# Patient Record
Sex: Male | Born: 1940 | Race: White | Hispanic: No | Smoking: Former smoker
Health system: Southern US, Community
[De-identification: ages and names within clinical notes are randomized; demographics above are authoritative.]

## PROBLEM LIST (undated history)

## (undated) DIAGNOSIS — I480 Paroxysmal atrial fibrillation: Secondary | ICD-10-CM

## (undated) DIAGNOSIS — I10 Essential (primary) hypertension: Secondary | ICD-10-CM

## (undated) DIAGNOSIS — M1990 Unspecified osteoarthritis, unspecified site: Secondary | ICD-10-CM

## (undated) DIAGNOSIS — E785 Hyperlipidemia, unspecified: Secondary | ICD-10-CM

## (undated) DIAGNOSIS — I82409 Acute embolism and thrombosis of unspecified deep veins of unspecified lower extremity: Secondary | ICD-10-CM

## (undated) DIAGNOSIS — Z87442 Personal history of urinary calculi: Secondary | ICD-10-CM

## (undated) HISTORY — DX: Essential (primary) hypertension: I10

## (undated) HISTORY — DX: Acute embolism and thrombosis of unspecified deep veins of unspecified lower extremity: I82.409

## (undated) HISTORY — PX: EYE SURGERY: SHX253

## (undated) HISTORY — DX: Hyperlipidemia, unspecified: E78.5

## (undated) HISTORY — DX: Paroxysmal atrial fibrillation: I48.0

## (undated) HISTORY — PX: TONSILLECTOMY: SUR1361

---

## 1959-01-24 HISTORY — PX: SHOULDER SURGERY: SHX246

## 2014-09-23 ENCOUNTER — Telehealth: Payer: Self-pay | Admitting: Internal Medicine

## 2014-09-23 NOTE — Telephone Encounter (Signed)
Rec'd records from [facility] Forwarding 24 page's to Dr.Ross

## 2014-10-16 ENCOUNTER — Ambulatory Visit (INDEPENDENT_AMBULATORY_CARE_PROVIDER_SITE_OTHER): Payer: Medicare PPO | Admitting: Internal Medicine

## 2014-10-16 ENCOUNTER — Encounter: Payer: Self-pay | Admitting: Internal Medicine

## 2014-10-16 VITALS — BP 146/94 | HR 81 | Ht 71.0 in | Wt 212.1 lb

## 2014-10-16 DIAGNOSIS — I481 Persistent atrial fibrillation: Secondary | ICD-10-CM

## 2014-10-16 DIAGNOSIS — I4819 Other persistent atrial fibrillation: Secondary | ICD-10-CM

## 2014-10-16 NOTE — Progress Notes (Signed)
Cardiology Office Note   Date:  10/16/2014   ID:  Malick Netz, DOB Nov 27, 1940, MRN [id number]  PCP:  Jonnie Kind  Cardiologist:   Dietrich Pates, MD   No chief complaint on file.     History of Present Illness: Curtis Wagner is a 74 y.o. male with a history of Atrial fib  He was followed   Review of the records and discussing with the pt he was in afib and treated with sotalol.  He went back to the [facility]op this summer   Found to be in Afib with RVR  Sotalol was discontinued  He was not felt to be a candidate for Tikosyn   Did not want amio due to side effects and age.  He walks regularly at the gym   Level 3 on treadmill for 1 hour.   Biggest complaint is R shoulder pain  Waiting to have surgery Preop evla  He denies CP  Breathing is OK  Rare dizziness  No syncope     Current Outpatient Prescriptions  Medication Sig Dispense Refill  . allopurinol (ZYLOPRIM) 300 MG tablet Take 300 mg by mouth daily.    . digoxin (DIGOX) 0.125 MG tablet Take 0.125 mg by mouth daily.    Marland Kitchen diltiazem (CARDIZEM CD) 180 MG 24 hr capsule Take 180 mg by mouth every morning.    . diltiazem (DILTIAZEM CD) 120 MG 24 hr capsule Take 120 mg by mouth every evening.    Marland Kitchen lisinopril (PRINIVIL,ZESTRIL) 10 MG tablet Take 10 mg by mouth daily.    . Multiple Vitamins-Minerals (CENTRUM SILVER ADULT 50+) TABS Take 1 tablet by mouth daily.    . rivaroxaban (XARELTO) 20 MG TABS tablet Take 20 mg by mouth daily with supper.     No current facility-administered medications for this visit.    Allergies:   Morphine and related   Past Medical History  Diagnosis Date  . PAF (paroxysmal atrial fibrillation)   . DVT (deep venous thrombosis)   . Hypertension   . Hyperlipidemia     Past Surgical History  Procedure Laterality Date  . Shoulder surgery       Social History:  The patient  reports that he has quit smoking. He has quit using smokeless tobacco. His smokeless tobacco use included Chew.   Family  History:  The patient's family history includes Heart disease in his mother; Rheumatic fever in his mother; Stroke in his father and mother.    ROS:  Please see the history of present illness. All other systems are reviewed and  Negative to the above problem except as noted.    PHYSICAL EXAM: VS:  BP 146/94 mmHg  Pulse 81  Ht  (1.803 m)  Wt 212 lb 2 oz (96.218 kg)  BMI 29.60 kg/m2  SpO2 97%  GEN: Well nourished, well developed, in no acute distress HEENT: normal Neck: no JVD, carotid bruits, or masses Cardiac: Irreg; no murmurs, rubs, or gallops,no edema  Respiratory:  clear to auscultation bilaterally, normal work of breathing GI: soft, nontender, nondistended, + BS  No hepatomegaly  MS: no deformity Moving all extremities   Skin: warm and dry, no rash Neuro:  Strength and sensation are intact Psych: euthymic mood, full affect   EKG:  EKG is ordered today.  Atrial fib  81 bpm  Occasional PVC  Nonspecific ST T wave changes     Lipid Panel No results found for: CHOL, TRIG, HDL, CHOLHDL, VLDL, LDLCALC, LDLDIRECT  Wt Readings from Last 3 Encounters:  10/16/14 212 lb 2 oz (96.218 kg)      ASSESSMENT AND PLAN: 1  Atrial fib  Currently on rate control  Fairly active  I am not sure that he is limited by it.   Pt and daughter did come today to discuss afib ablation  That had been discussed with his other cardiologist.   I would not favor before shoulder surgery  This would delay things  Again, I am not sure how much he would gain if he returned to SR  2.  Preop cardiac eval  Pt is rel active .  No CP  No SOB   Recent echo LVEF was normal   Ovreall I think he is at low risk for major cardiac event and ok to proceed without further testing.  I will forward note to Dr Pamella Pert in [place]  Pt will call when surgery is over     Signed, Dietrich Pates, MD  10/16/2014 10:33 AM    [facility] [address] [place], [place], [place]  [address] Phone: [phone]; Fax: [phone]

## 2014-10-16 NOTE — Patient Instructions (Signed)
Medication Instructions: .  Your physician recommends that you continue on your current medications as directed. Please refer to the Current Medication list given to you today.  Labwork: NONE ORDER TODAY    Testing/Procedures: NONE ORDER TODAY    Follow-Up:  WILL CONTACT OFFICE BACK AFTER SHOULDER  SURGERY    Any Other Special Instructions Will Be Listed Below (If Applicable).

## 2014-11-19 ENCOUNTER — Encounter: Payer: Self-pay | Admitting: Internal Medicine

## 2014-12-21 ENCOUNTER — Other Ambulatory Visit: Payer: Self-pay | Admitting: Physician Assistant

## 2014-12-21 NOTE — H&P (Signed)
Curtis Wagner is a new patient to the office.  Comes in with his daughter.  This is a healthy 74 year-old male.  Progressive increasing symptoms, right shoulder.  Gotten steadily worse over the last two years.  It has gotten to a point that he has rest pain and night pain.  Marked functional impact.  His shoulder is getting stiff enough he is having trouble putting deodorant on.  He has had previous evaluation, including MRI scan, that shows marked degenerative changes throughout the shoulder.  Some thinning attenuation of his cuff, but this still appears to be intact.  All of this very consistent with marked end stage arthritis.  He has not had significant operative intervention at all on this side.  He comes in to discuss definitive treatment.   Remaining history is reviewed.  Significant for atrial fibrillation, which is stable, although he is on Xarelto.  He has already seen his cardiologist and has been told that he can be given clearance for surgery.  Of note, he has had operative intervention on the left shoulder back in the 1960's.  This sounds like a Magnuson-Stack for instability.  That cured his instability.  He has never gotten his full motion back, but he is not having any significant symptoms there now.    EXAMINATION: General exam is reviewed.  Specifically, 74 year-old.  Height: 5?11.  Weight: 195 pounds.  Right shoulder has maybe 30% active and passive motion.  Marked grating.  Marked crepitus.  Pain at end points.  On the left he can go overhead and abduct, but he has no external rotation.  Well healed incision.  Neurovascularly intact distally.    X-RAYS: Three view x-rays of the right shoulder with sizing balls shows end stage changes.  Periarticular spurring.  Still reasonable subacromial space.    DISPOSITION:  End stage arthritis, right shoulder.  The only option he has here is a total shoulder and he understands that.  That procedure, risks, benefits and complications reviewed.   Paperwork complete.  All questions answered.  I think we can do this on an outpatient basis.  We are going to need to get cardiac clearance.  That will be complete and I will see him at the time of operative intervention.  I have also talked to both he and his daughter about what to expect post-op and what plans they need to make.    Loreta Aveaniel F. Murphy, M.D.

## 2014-12-21 NOTE — Progress Notes (Signed)
Chart reviewed by Dr Desmond Lopeurk, due to patient hx a-fib he feels patient will need cardiac monitoring after surgery. Dr Murphy's office notified of need to be done at [facility].

## 2014-12-25 ENCOUNTER — Other Ambulatory Visit: Payer: Self-pay | Admitting: Physician Assistant

## 2014-12-30 NOTE — Pre-Procedure Instructions (Signed)
Curtis BarcelonaMichael Gorniak  12/30/2014      [facility] - [place], [place] - [id number] [place] [id number] Phone: [phone] Fax: [phone]    Your procedure is scheduled on Fri, Dec 9 @ 1:40 PM  [facility] Admitting at 11:30 AM  Call this number if you have problems the morning of surgery:  [phone]   Remember:  Do not eat food or drink liquids after midnight.  Take these medicines the morning of surgery with A SIP OF WATER Allopurinol(Zyloprim),Digoxin(Digox),and Diltiazem(Cardizem)            No Goody's,BC's,Aleve,Aspirin,Ibuprofen,Motrin,Advil,Fish Oil,or any Herbal Medications.   Do not wear jewelry.  Do not wear lotions, powders, or colognes.  You may wear deodorant.             Men may shave face and neck.  Do not bring valuables to the hospital.  [facility] is not responsible for any belongings or valuables.  Contacts, dentures or bridgework may not be worn into surgery.  Leave your suitcase in the car.  After surgery it may be brought to your room.  For patients admitted to the hospital, discharge time will be determined by your treatment team.  Patients discharged the day of surgery will not be allowed to drive home.      Special instructions: [facility] - Preparing for Surgery  Before surgery, you can play an important role.  Because skin is not sterile, your skin needs to be as free of germs as possible.  You can reduce the number of germs on you skin by washing with CHG (chlorahexidine gluconate) soap before surgery.  CHG is an antiseptic cleaner which kills germs and bonds with the skin to continue killing germs even after washing.  Please DO NOT use if you have an allergy to CHG or antibacterial soaps.  If your skin becomes reddened/irritated stop using the CHG and inform your nurse when you arrive at Short Stay.  Do not shave (including legs and underarms) for at least 48 hours prior to the first  CHG shower.  You may shave your face.  Please follow these instructions carefully:   1.  Shower with CHG Soap the night before surgery and the                                morning of Surgery.  2.  If you choose to wash your hair, wash your hair first as usual with your       normal shampoo.  3.  After you shampoo, rinse your hair and body thoroughly to remove the                      Shampoo.  4.  Use CHG as you would any other liquid soap.  You can apply chg directly       to the skin and wash gently with scrungie or a clean washcloth.  5.  Apply the CHG Soap to your body ONLY FROM THE NECK DOWN.        Do not use on open wounds or open sores.  Avoid contact with your eyes,       ears, mouth and genitals (private parts).  Wash genitals (private parts)       with your normal soap.  6.  Wash thoroughly, paying special attention to  the area where your surgery        will be performed.  7.  Thoroughly rinse your body with warm water from the neck down.  8.  DO NOT shower/wash with your normal soap after using and rinsing off       the CHG Soap.  9.  Pat yourself dry with a clean towel.            10.  Wear clean pajamas.            11.  Place clean sheets on your bed the night of your first shower and do not        sleep with pets.  Day of Surgery  Do not apply any lotions/deoderants the morning of surgery.  Please wear clean clothes to the hospital/surgery center.    Please read over the following fact sheets that you were given. Pain Booklet, Coughing and Deep Breathing, MRSA Information and Surgical Site Infection Prevention

## 2014-12-31 ENCOUNTER — Encounter (HOSPITAL_COMMUNITY)
Admission: RE | Admit: 2014-12-31 | Discharge: 2014-12-31 | Disposition: A | Discharge status: Home / Self Care | Payer: Medicare PPO | Source: Ambulatory Visit | Attending: Orthopedic Surgery | Admitting: Orthopedic Surgery

## 2014-12-31 ENCOUNTER — Encounter (HOSPITAL_COMMUNITY): Payer: Self-pay

## 2014-12-31 HISTORY — DX: Unspecified osteoarthritis, unspecified site: M19.90

## 2014-12-31 LAB — COMPREHENSIVE METABOLIC PANEL
ALT: 20 U/L (ref 17–63)
ANION GAP: 7 (ref 5–15)
AST: 23 U/L (ref 15–41)
Albumin: 3.8 g/dL (ref 3.5–5.0)
Alkaline Phosphatase: 100 U/L (ref 38–126)
BILIRUBIN TOTAL: 0.9 mg/dL (ref 0.3–1.2)
BUN: 14 mg/dL (ref 6–20)
CHLORIDE: 107 mmol/L (ref 101–111)
CO2: 28 mmol/L (ref 22–32)
Calcium: 9.5 mg/dL (ref 8.9–10.3)
Creatinine, Ser: 1.37 mg/dL — ABNORMAL HIGH (ref 0.61–1.24)
GFR, EST AFRICAN AMERICAN: 56 mL/min — AB (ref >60)
GFR, EST NON AFRICAN AMERICAN: 49 mL/min — AB (ref >60)
Glucose, Bld: 108 mg/dL — ABNORMAL HIGH (ref 65–99)
POTASSIUM: 3.9 mmol/L (ref 3.5–5.1)
Sodium: 142 mmol/L (ref 135–145)
TOTAL PROTEIN: 7.3 g/dL (ref 6.5–8.1)

## 2014-12-31 LAB — CBC WITH DIFFERENTIAL/PLATELET
BASOS ABS: 0.1 10*3/uL (ref 0.0–0.1)
BASOS PCT: 1 %
EOS PCT: 3 %
Eosinophils Absolute: 0.2 10*3/uL (ref 0.0–0.7)
HEMATOCRIT: 52 % (ref 39.0–52.0)
Hemoglobin: 17.8 g/dL — ABNORMAL HIGH (ref 13.0–17.0)
LYMPHS PCT: 25 %
Lymphs Abs: 1.8 10*3/uL (ref 0.7–4.0)
MCH: 33.1 pg (ref 26.0–34.0)
MCHC: 34.2 g/dL (ref 30.0–36.0)
MCV: 96.8 fL (ref 78.0–100.0)
MONO ABS: 0.5 10*3/uL (ref 0.1–1.0)
MONOS PCT: 6 %
NEUTROS ABS: 4.8 10*3/uL (ref 1.7–7.7)
Neutrophils Relative %: 65 %
PLATELETS: 216 10*3/uL (ref 150–400)
RBC: 5.37 MIL/uL (ref 4.22–5.81)
RDW: 13.6 % (ref 11.5–15.5)
WBC: 7.4 10*3/uL (ref 4.0–10.5)

## 2014-12-31 LAB — ABO/RH: ABO/RH(D): A POS

## 2014-12-31 LAB — TYPE AND SCREEN
ABO/RH(D): A POS
ANTIBODY SCREEN: NEGATIVE

## 2014-12-31 LAB — SURGICAL PCR SCREEN
MRSA, PCR: NEGATIVE
STAPHYLOCOCCUS AUREUS: NEGATIVE

## 2014-12-31 LAB — PROTIME-INR
INR: 1.08 (ref 0.00–1.49)
PROTHROMBIN TIME: 14.2 s (ref 11.6–15.2)

## 2014-12-31 LAB — APTT: APTT: 28 s (ref 24–37)

## 2014-12-31 MED ORDER — CHLORHEXIDINE GLUCONATE 4 % EX LIQD: 60.0000 mL | Freq: Once | CUTANEOUS | Status: DC

## 2014-12-31 MED ORDER — CEFAZOLIN SODIUM-DEXTROSE 2-3 GM-% IV SOLR
2.0000 g | INTRAVENOUS | Status: AC
  Administered 2015-01-01: 2 g via INTRAVENOUS
  Filled 2014-12-31: qty 50

## 2014-12-31 MED ORDER — LACTATED RINGERS IV SOLN
INTRAVENOUS | Status: DC
  Administered 2015-01-01 (×2): via INTRAVENOUS

## 2014-12-31 NOTE — Progress Notes (Signed)
Anesthesia Chart Review:  Pt is 74 year old male scheduled for R total shoulder arthroplasty on 01/01/2015 with Dr. Jamison Neighbor. Murphy.   PMH includes:  PAF, HTN, DVT, hyperlipidemia. Former smoker. BMI 29.5  Medications include: digoxin, diltiazem, lisinopril, xarelto. Last xarelto 12/25/14.   Preoperative labs reviewed.   EKG 10/17/14: atrial fibrillation with premature aberrantly conducted complexes. 81 bpm. Nonspecific ST and T wave abnormality, probably digitalis effect.   Echo 07/22/14:  1. Normal LV size, thickness and function, EF 68%, with grade 1 diastolic dysfunction.  2. Mild AR, MR and TR with PA pressure 40 mmHg.  Treadmill stress test 07/01/12:  1. Normal routine treadmill stress test 2. Moderately impaired aerobic exercise capacity for his age 373. No ischemic ST changes were noted 4. Pt did have appropriate HR and BP response to exercise.   [place], [place] and is followed by cardiology in nearby [place].  Has family here so planned for surgery here. Dr. Dietrich PatesPaula Ross with cardiology in [place] saw pt for pre-op eval 10/16/14 and reviewed records from pt's primary cardiologist (found in media tab).  Dr. Tenny Crawoss has cleared pt at low risk for surgery.   If no changes, I anticipate pt can proceed with surgery as scheduled.   Rica Mastngela Charene Mccallister, FNP-BC [facility]/Anesthesiology Phone: [phone] 12/31/2014 4:35 PM

## 2014-12-31 NOTE — Progress Notes (Signed)
Call to A. Kabbe,NP, reported need for review of pt's  History & Dr. Charlott Rakesoss's note.

## 2014-12-31 NOTE — Progress Notes (Signed)
Pt. Has been seen by P. Ross for cardiac eval. Pt. Lives in [place], [place], ECHO scanned in from that area of [place].

## 2015-01-01 ENCOUNTER — Inpatient Hospital Stay (HOSPITAL_COMMUNITY): Payer: Medicare PPO

## 2015-01-01 ENCOUNTER — Inpatient Hospital Stay (HOSPITAL_COMMUNITY): Payer: Medicare PPO | Admitting: Anesthesiology

## 2015-01-01 ENCOUNTER — Inpatient Hospital Stay (HOSPITAL_COMMUNITY): Payer: Medicare PPO | Admitting: Emergency Medicine

## 2015-01-01 ENCOUNTER — Encounter (HOSPITAL_COMMUNITY)
Admission: RE | Disposition: A | Discharge status: Home / Self Care | Payer: Self-pay | Source: Ambulatory Visit | Attending: Orthopedic Surgery

## 2015-01-01 ENCOUNTER — Inpatient Hospital Stay (HOSPITAL_COMMUNITY)
Admission: RE | Admit: 2015-01-01 | Discharge: 2015-01-02 | DRG: 483 | Disposition: A | Discharge status: Home / Self Care | Payer: Medicare PPO | Source: Ambulatory Visit | Attending: Orthopedic Surgery | Admitting: Orthopedic Surgery

## 2015-01-01 ENCOUNTER — Encounter (HOSPITAL_COMMUNITY): Payer: Self-pay | Admitting: Certified Registered Nurse Anesthetist

## 2015-01-01 DIAGNOSIS — I10 Essential (primary) hypertension: Secondary | ICD-10-CM | POA: Diagnosis present

## 2015-01-01 DIAGNOSIS — Z87891 Personal history of nicotine dependence: Secondary | ICD-10-CM | POA: Diagnosis not present

## 2015-01-01 DIAGNOSIS — Z7901 Long term (current) use of anticoagulants: Secondary | ICD-10-CM | POA: Diagnosis not present

## 2015-01-01 DIAGNOSIS — M19012 Primary osteoarthritis, left shoulder: Secondary | ICD-10-CM | POA: Diagnosis not present

## 2015-01-01 DIAGNOSIS — E785 Hyperlipidemia, unspecified: Secondary | ICD-10-CM | POA: Diagnosis present

## 2015-01-01 DIAGNOSIS — Z96611 Presence of right artificial shoulder joint: Secondary | ICD-10-CM

## 2015-01-01 DIAGNOSIS — I480 Paroxysmal atrial fibrillation: Secondary | ICD-10-CM | POA: Diagnosis not present

## 2015-01-01 DIAGNOSIS — M19011 Primary osteoarthritis, right shoulder: Secondary | ICD-10-CM | POA: Diagnosis present

## 2015-01-01 DIAGNOSIS — Z86718 Personal history of other venous thrombosis and embolism: Secondary | ICD-10-CM

## 2015-01-01 HISTORY — PX: TOTAL SHOULDER ARTHROPLASTY: SHX126

## 2015-01-01 HISTORY — DX: Personal history of urinary calculi: Z87.442

## 2015-01-01 LAB — URINE CULTURE

## 2015-01-01 SURGERY — ARTHROPLASTY, SHOULDER, TOTAL
Anesthesia: Regional | Site: Shoulder | Laterality: Right

## 2015-01-01 MED ORDER — PROPOFOL 10 MG/ML IV BOLUS
INTRAVENOUS | Status: DC | PRN
  Administered 2015-01-01: 120 mg via INTRAVENOUS

## 2015-01-01 MED ORDER — HYDROMORPHONE HCL 1 MG/ML IJ SOLN: 0.2500 mg | INTRAMUSCULAR | Status: DC | PRN

## 2015-01-01 MED ORDER — FENTANYL CITRATE (PF) 100 MCG/2ML IJ SOLN
INTRAMUSCULAR | Status: AC
  Filled 2015-01-01: qty 2

## 2015-01-01 MED ORDER — MIDAZOLAM HCL 2 MG/2ML IJ SOLN: 2.0000 mg | Freq: Once | INTRAMUSCULAR | Status: DC

## 2015-01-01 MED ORDER — ZOLPIDEM TARTRATE 5 MG PO TABS: 5.0000 mg | ORAL_TABLET | Freq: Every evening | ORAL | Status: DC | PRN

## 2015-01-01 MED ORDER — ALUM & MAG HYDROXIDE-SIMETH 200-200-20 MG/5ML PO SUSP: 30.0000 mL | ORAL | Status: DC | PRN

## 2015-01-01 MED ORDER — METOCLOPRAMIDE HCL 5 MG PO TABS: 5.0000 mg | ORAL_TABLET | Freq: Three times a day (TID) | ORAL | Status: DC | PRN

## 2015-01-01 MED ORDER — FENTANYL CITRATE (PF) 250 MCG/5ML IJ SOLN
INTRAMUSCULAR | Status: AC
  Filled 2015-01-01: qty 5

## 2015-01-01 MED ORDER — ONDANSETRON HCL 4 MG PO TABS: 4.0000 mg | ORAL_TABLET | Freq: Four times a day (QID) | ORAL | Status: DC | PRN

## 2015-01-01 MED ORDER — CEFAZOLIN SODIUM-DEXTROSE 2-3 GM-% IV SOLR
2.0000 g | Freq: Four times a day (QID) | INTRAVENOUS | Status: AC
  Administered 2015-01-01 – 2015-01-02 (×3): 2 g via INTRAVENOUS
  Filled 2015-01-01 (×4): qty 50

## 2015-01-01 MED ORDER — BUPIVACAINE HCL (PF) 0.25 % IJ SOLN
INTRAMUSCULAR | Status: AC
  Filled 2015-01-01: qty 30

## 2015-01-01 MED ORDER — POTASSIUM CHLORIDE IN NACL 20-0.9 MEQ/L-% IV SOLN
INTRAVENOUS | Status: DC
  Administered 2015-01-01: 19:00:00 via INTRAVENOUS
  Filled 2015-01-01: qty 1000

## 2015-01-01 MED ORDER — KETOROLAC TROMETHAMINE 30 MG/ML IJ SOLN
INTRAMUSCULAR | Status: AC
  Filled 2015-01-01: qty 1

## 2015-01-01 MED ORDER — ROCURONIUM BROMIDE 100 MG/10ML IV SOLN
INTRAVENOUS | Status: DC | PRN
  Administered 2015-01-01: 50 mg via INTRAVENOUS

## 2015-01-01 MED ORDER — CELECOXIB 200 MG PO CAPS
200.0000 mg | ORAL_CAPSULE | Freq: Two times a day (BID) | ORAL | Status: DC
  Administered 2015-01-01 – 2015-01-02 (×2): 200 mg via ORAL
  Filled 2015-01-01 (×2): qty 1

## 2015-01-01 MED ORDER — ONDANSETRON HCL 4 MG/2ML IJ SOLN
INTRAMUSCULAR | Status: AC
  Filled 2015-01-01: qty 2

## 2015-01-01 MED ORDER — DIPHENHYDRAMINE HCL 12.5 MG/5ML PO ELIX: 12.5000 mg | ORAL_SOLUTION | ORAL | Status: DC | PRN

## 2015-01-01 MED ORDER — ACETAMINOPHEN 650 MG RE SUPP: 650.0000 mg | Freq: Four times a day (QID) | RECTAL | Status: DC | PRN

## 2015-01-01 MED ORDER — METOCLOPRAMIDE HCL 5 MG/ML IJ SOLN: 5.0000 mg | Freq: Three times a day (TID) | INTRAMUSCULAR | Status: DC | PRN

## 2015-01-01 MED ORDER — POLYETHYLENE GLYCOL 3350 17 G PO PACK: 17.0000 g | PACK | Freq: Every day | ORAL | Status: DC | PRN

## 2015-01-01 MED ORDER — SUCCINYLCHOLINE CHLORIDE 20 MG/ML IJ SOLN
INTRAMUSCULAR | Status: AC
  Filled 2015-01-01: qty 1

## 2015-01-01 MED ORDER — ALLOPURINOL 300 MG PO TABS
300.0000 mg | ORAL_TABLET | Freq: Every day | ORAL | Status: DC
  Administered 2015-01-02: 300 mg via ORAL
  Filled 2015-01-01: qty 1

## 2015-01-01 MED ORDER — LIDOCAINE HCL (CARDIAC) 20 MG/ML IV SOLN
INTRAVENOUS | Status: AC
  Filled 2015-01-01: qty 5

## 2015-01-01 MED ORDER — HYDROCODONE-ACETAMINOPHEN 7.5-325 MG PO TABS: 1.0000 | ORAL_TABLET | ORAL | Status: DC | PRN

## 2015-01-01 MED ORDER — LISINOPRIL 10 MG PO TABS
10.0000 mg | ORAL_TABLET | Freq: Every day | ORAL | Status: DC
  Administered 2015-01-01: 10 mg via ORAL
  Filled 2015-01-01: qty 1

## 2015-01-01 MED ORDER — PHENYLEPHRINE 40 MCG/ML (10ML) SYRINGE FOR IV PUSH (FOR BLOOD PRESSURE SUPPORT)
PREFILLED_SYRINGE | INTRAVENOUS | Status: AC
  Filled 2015-01-01: qty 10

## 2015-01-01 MED ORDER — MAGNESIUM CITRATE PO SOLN: 1.0000 | Freq: Once | ORAL | Status: DC | PRN

## 2015-01-01 MED ORDER — RIVAROXABAN 20 MG PO TABS: 20.0000 mg | ORAL_TABLET | Freq: Every day | ORAL | Status: DC

## 2015-01-01 MED ORDER — ONDANSETRON HCL 4 MG/2ML IJ SOLN: 4.0000 mg | Freq: Four times a day (QID) | INTRAMUSCULAR | Status: DC | PRN

## 2015-01-01 MED ORDER — HYDROMORPHONE HCL 1 MG/ML IJ SOLN: 0.5000 mg | INTRAMUSCULAR | Status: DC | PRN

## 2015-01-01 MED ORDER — PHENYLEPHRINE HCL 10 MG/ML IJ SOLN
10.0000 mg | INTRAMUSCULAR | Status: DC | PRN
  Administered 2015-01-01: 30 ug/min via INTRAVENOUS

## 2015-01-01 MED ORDER — DILTIAZEM HCL ER COATED BEADS 180 MG PO CP24
180.0000 mg | ORAL_CAPSULE | Freq: Every morning | ORAL | Status: DC
  Administered 2015-01-02: 180 mg via ORAL
  Filled 2015-01-01: qty 1

## 2015-01-01 MED ORDER — DOCUSATE SODIUM 100 MG PO CAPS
100.0000 mg | ORAL_CAPSULE | Freq: Two times a day (BID) | ORAL | Status: DC
  Administered 2015-01-01 – 2015-01-02 (×2): 100 mg via ORAL
  Filled 2015-01-01 (×2): qty 1

## 2015-01-01 MED ORDER — FENTANYL CITRATE (PF) 100 MCG/2ML IJ SOLN
INTRAMUSCULAR | Status: DC | PRN
  Administered 2015-01-01: 100 ug via INTRAVENOUS
  Administered 2015-01-01 (×3): 25 ug via INTRAVENOUS

## 2015-01-01 MED ORDER — PHENOL 1.4 % MT LIQD: 1.0000 | OROMUCOSAL | Status: DC | PRN

## 2015-01-01 MED ORDER — PROMETHAZINE HCL 25 MG/ML IJ SOLN: 6.2500 mg | INTRAMUSCULAR | Status: DC | PRN

## 2015-01-01 MED ORDER — BUPIVACAINE-EPINEPHRINE (PF) 0.5% -1:200000 IJ SOLN
INTRAMUSCULAR | Status: AC
  Filled 2015-01-01: qty 30

## 2015-01-01 MED ORDER — MENTHOL 3 MG MT LOZG: 1.0000 | LOZENGE | OROMUCOSAL | Status: DC | PRN

## 2015-01-01 MED ORDER — HYDROCODONE-ACETAMINOPHEN 7.5-325 MG PO TABS
1.0000 | ORAL_TABLET | ORAL | Status: DC | PRN
  Administered 2015-01-01: 1 via ORAL
  Administered 2015-01-02 (×3): 2 via ORAL
  Filled 2015-01-01 (×2): qty 2
  Filled 2015-01-01: qty 1
  Filled 2015-01-01: qty 2

## 2015-01-01 MED ORDER — HYDROCODONE-ACETAMINOPHEN 7.5-325 MG PO TABS
1.0000 | ORAL_TABLET | Freq: Once | ORAL | Status: DC | PRN
Start: 2015-01-01 — End: 2015-01-01

## 2015-01-01 MED ORDER — DIGOXIN 125 MCG PO TABS
0.1250 mg | ORAL_TABLET | Freq: Every day | ORAL | Status: DC
  Administered 2015-01-02: 0.125 mg via ORAL
  Filled 2015-01-01: qty 1

## 2015-01-01 MED ORDER — DILTIAZEM HCL ER COATED BEADS 120 MG PO CP24
120.0000 mg | ORAL_CAPSULE | Freq: Every evening | ORAL | Status: DC
  Administered 2015-01-01: 120 mg via ORAL
  Filled 2015-01-01: qty 1

## 2015-01-01 MED ORDER — ONDANSETRON HCL 4 MG PO TABS: 4.0000 mg | ORAL_TABLET | Freq: Three times a day (TID) | ORAL | Status: DC | PRN

## 2015-01-01 MED ORDER — ACETAMINOPHEN 325 MG PO TABS: 650.0000 mg | ORAL_TABLET | Freq: Four times a day (QID) | ORAL | Status: DC | PRN

## 2015-01-01 MED ORDER — PROPOFOL 10 MG/ML IV BOLUS
INTRAVENOUS | Status: AC
  Filled 2015-01-01: qty 40

## 2015-01-01 MED ORDER — ONDANSETRON HCL 4 MG/2ML IJ SOLN
INTRAMUSCULAR | Status: DC | PRN
  Administered 2015-01-01: 4 mg via INTRAVENOUS

## 2015-01-01 MED ORDER — FENTANYL CITRATE (PF) 100 MCG/2ML IJ SOLN
100.0000 ug | Freq: Once | INTRAMUSCULAR | Status: AC
  Administered 2015-01-01: 50 ug via INTRAVENOUS

## 2015-01-01 MED ORDER — BISACODYL 10 MG RE SUPP: 10.0000 mg | Freq: Every day | RECTAL | Status: DC | PRN

## 2015-01-01 MED ORDER — BUPIVACAINE-EPINEPHRINE (PF) 0.5% -1:200000 IJ SOLN
INTRAMUSCULAR | Status: DC | PRN
  Administered 2015-01-01: 25 mL via PERINEURAL

## 2015-01-01 SURGICAL SUPPLY — 61 items
BENZOIN TINCTURE PRP APPL 2/3 (GAUZE/BANDAGES/DRESSINGS) ×3 IMPLANT
BLADE SAW SGTL 83.5X18.5 (BLADE) ×3 IMPLANT
BOWL SMART MIX CTS (DISPOSABLE) IMPLANT
BRUSH FEMORAL CANAL (MISCELLANEOUS) IMPLANT
BUR SURG 4X8 MED (BURR) IMPLANT
BURR SURG 4MMX8MM MEDIUM (BURR)
BURR SURG 4X8 MED (BURR)
CAPT SHLDR TOTAL 2 ×3 IMPLANT
CEMENT BONE SIMPLEX SPEEDSET (Cement) ×3 IMPLANT
CLOSURE STERI-STRIP 1/2X4 (GAUZE/BANDAGES/DRESSINGS) ×1
CLOSURE WOUND 1/2 X4 (GAUZE/BANDAGES/DRESSINGS) ×1
CLSR STERI-STRIP ANTIMIC 1/2X4 (GAUZE/BANDAGES/DRESSINGS) ×2 IMPLANT
COVER SURGICAL LIGHT HANDLE (MISCELLANEOUS) ×3 IMPLANT
DRAPE IMP U-DRAPE 54X76 (DRAPES) ×3 IMPLANT
DRAPE ORTHO SPLIT 77X108 STRL (DRAPES) ×4
DRAPE SURG ORHT 6 SPLT 77X108 (DRAPES) ×2 IMPLANT
DRAPE U-SHAPE 47X51 STRL (DRAPES) ×3 IMPLANT
DRESSING AQUACEL AQ EXTRA 4X5 (GAUZE/BANDAGES/DRESSINGS) ×3 IMPLANT
DRSG AQUACEL AG ADV 3.5X10 (GAUZE/BANDAGES/DRESSINGS) ×3 IMPLANT
DURAPREP 26ML APPLICATOR (WOUND CARE) ×3 IMPLANT
ELECT CAUTERY BLADE 6.4 (BLADE) ×3 IMPLANT
ELECT REM PT RETURN 9FT ADLT (ELECTROSURGICAL) ×3
ELECTRODE REM PT RTRN 9FT ADLT (ELECTROSURGICAL) ×1 IMPLANT
EVACUATOR 1/8 PVC DRAIN (DRAIN) IMPLANT
FACESHIELD WRAPAROUND (MASK) ×6 IMPLANT
GLOVE BIOGEL PI IND STRL 6.5 (GLOVE) ×1 IMPLANT
GLOVE BIOGEL PI IND STRL 7.0 (GLOVE) ×1 IMPLANT
GLOVE BIOGEL PI INDICATOR 6.5 (GLOVE) ×2
GLOVE BIOGEL PI INDICATOR 7.0 (GLOVE) ×2
GLOVE ECLIPSE 6.5 STRL STRAW (GLOVE) ×3 IMPLANT
GLOVE ECLIPSE 7.0 STRL STRAW (GLOVE) ×3 IMPLANT
GLOVE ORTHO TXT STRL SZ7.5 (GLOVE) ×3 IMPLANT
GOWN STRL REUS W/ TWL LRG LVL3 (GOWN DISPOSABLE) ×2 IMPLANT
GOWN STRL REUS W/TWL LRG LVL3 (GOWN DISPOSABLE) ×4
HANDPIECE INTERPULSE COAX TIP (DISPOSABLE)
KIT BASIN OR (CUSTOM PROCEDURE TRAY) ×3 IMPLANT
KIT ROOM TURNOVER OR (KITS) ×3 IMPLANT
MANIFOLD NEPTUNE II (INSTRUMENTS) ×3 IMPLANT
NEEDLE HYPO 25GX1X1/2 BEV (NEEDLE) ×3 IMPLANT
NS IRRIG 1000ML POUR BTL (IV SOLUTION) ×3 IMPLANT
PACK SHOULDER (CUSTOM PROCEDURE TRAY) ×3 IMPLANT
PAD ARMBOARD 7.5X6 YLW CONV (MISCELLANEOUS) ×6 IMPLANT
SET HNDPC FAN SPRY TIP SCT (DISPOSABLE) IMPLANT
SLEEVE SURGEON STRL (DRAPES) ×3 IMPLANT
SLING ARM IMMOBILIZER LRG (SOFTGOODS) IMPLANT
SLING ARM IMMOBILIZER XL (CAST SUPPLIES) IMPLANT
SPONGE LAP 18X18 X RAY DECT (DISPOSABLE) ×3 IMPLANT
STRIP CLOSURE SKIN 1/2X4 (GAUZE/BANDAGES/DRESSINGS) ×2 IMPLANT
SUCTION FRAZIER TIP 10 FR DISP (SUCTIONS) ×3 IMPLANT
SUT FIBERWIRE #2 38 REV NDL BL (SUTURE) ×6
SUT MNCRL AB 4-0 PS2 18 (SUTURE) ×3 IMPLANT
SUT MON AB 2-0 CT1 36 (SUTURE) ×3 IMPLANT
SUT VIC AB 0 CT1 27 (SUTURE) ×2
SUT VIC AB 0 CT1 27XBRD ANBCTR (SUTURE) ×1 IMPLANT
SUT VIC AB 2-0 CT1 27 (SUTURE) ×2
SUT VIC AB 2-0 CT1 TAPERPNT 27 (SUTURE) ×1 IMPLANT
SUTURE FIBERWR#2 38 REV NDL BL (SUTURE) ×2 IMPLANT
SYR CONTROL 10ML LL (SYRINGE) ×3 IMPLANT
TOWEL OR 17X24 6PK STRL BLUE (TOWEL DISPOSABLE) ×3 IMPLANT
TOWEL OR 17X26 10 PK STRL BLUE (TOWEL DISPOSABLE) ×3 IMPLANT
WATER STERILE IRR 1000ML POUR (IV SOLUTION) ×3 IMPLANT

## 2015-01-01 NOTE — Transfer of Care (Signed)
Immediate Anesthesia Transfer of Care Note  Patient: Curtis Wagner  Procedure(s) Performed: Procedure(s): RIGHT TOTAL SHOULDER ARTHROPLASTY (Right)  Patient Location: PACU  Anesthesia Type:General and Regional  Level of Consciousness: awake, alert , oriented and patient cooperative  Airway & Oxygen Therapy: Patient Spontanous Breathing and Patient connected to nasal cannula oxygen  Post-op Assessment: Report given to RN and Post -op Vital signs reviewed and stable  Post vital signs: Reviewed and stable  Last Vitals:  Filed Vitals:   01/01/15 1247 01/01/15 1300  BP: 155/85   Pulse: 74 66  Temp:    Resp: 21 26    Complications: No apparent anesthesia complications

## 2015-01-01 NOTE — Discharge Summary (Signed)
Patient ID: Curtis Wagner MRN: [id number] DOB/AGE: 1940-06-14 74 y.o.  Admit date: 01/01/2015 Discharge date: 01/01/2015  Admission Diagnoses:  Active Problems:   * No active hospital problems. *   Discharge Diagnoses:  Same  Past Medical History  Diagnosis Date  . PAF (paroxysmal atrial fibrillation) (HCC)   . DVT (deep venous thrombosis) (HCC)   . Hypertension   . Hyperlipidemia   . 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 History of kidney stones     Surgeries: Procedure(s): RIGHT TOTAL SHOULDER ARTHROPLASTY on 01/01/2015   Consultants:    Discharged Condition: Improved  Hospital Course: Curtis Wagner is an 74 y.o. male who was admitted 01/01/2015 for operative treatment of primary localized osteoarthritis right shoulder. Patient has severe unremitting pain that affects sleep, daily activities, and work/hobbies. After pre-op clearance the patient was taken to the operating room on 01/01/2015 and underwent  Procedure(s): RIGHT TOTAL SHOULDER ARTHROPLASTY.    Patient was given perioperative antibiotics: Anti-infectives    Start     Dose/Rate Route Frequency Ordered Stop   01/01/15 1300  ceFAZolin (ANCEF) IVPB 2 g/50 mL premix     2 g 100 mL/hr over 30 Minutes Intravenous To [facility] Surgical 12/31/14 0912 01/01/15 1400       Patient was given sequential compression devices, early ambulation, and chemoprophylaxis to prevent DVT.  Patient benefited maximally from hospital stay and there were no complications.    Recent vital signs: Patient Vitals for the past 24 hrs:  BP Temp Temp src Pulse Resp SpO2 Weight  01/01/15 1300 - - - 66 (!) 26 98 % -  01/01/15 1247 (!) 155/85 mmHg - - 74 (!) 21 99 % -  01/01/15 1246 - - - 87 10 97 % -  01/01/15 1146 (!) 149/92 mmHg 97.6 F (36.4 C) Oral 80 20 99 % 95.709 kg (211 lb)     Recent laboratory studies:  Recent Labs  12/31/14 1022  WBC 7.4  HGB 17.8*  HCT 52.0  PLT 216  NA 142  K  3.9  CL 107  CO2 28  BUN 14  CREATININE 1.37*  GLUCOSE 108*  INR 1.08  CALCIUM 9.5     Discharge Medications:     Medication List    TAKE these medications        allopurinol 300 MG tablet  Commonly known as:  ZYLOPRIM  Take 300 mg by mouth daily.     CENTRUM SILVER ADULT 50+ Tabs  Take 1 tablet by mouth daily.     DIGOX 0.125 MG tablet  Generic drug:  digoxin  Take 0.125 mg by mouth daily.     diltiazem 180 MG 24 hr capsule  Commonly known as:  CARDIZEM CD  Take 180 mg by mouth every morning.     DILTIAZEM CD 120 MG 24 hr capsule  Generic drug:  diltiazem  Take 120 mg by mouth every evening.     HYDROcodone-acetaminophen 7.5-325 MG tablet  Commonly known as:  NORCO  Take 1-2 tablets by mouth every 4 (four) hours as needed for moderate pain.     lisinopril 10 MG tablet  Commonly known as:  PRINIVIL,ZESTRIL  Take 10 mg by mouth daily after supper.     ondansetron 4 MG tablet  Commonly known as:  ZOFRAN  Take 1 tablet (4 mg total) by mouth every 8 (eight) hours as needed for nausea or vomiting.     rivaroxaban  20 MG Tabs tablet  Commonly known as:  XARELTO  Take 20 mg by mouth daily with supper.        Diagnostic Studies: No results found.  Disposition: Final discharge disposition not confirmed        Follow-up Information    Follow up with Loreta Aveaniel F Murphy, MD. Schedule an appointment as soon as possible for a visit in 1 week.   Specialty:  Orthopedic Surgery   Contact information:   [address] [place] [address] [phone]        Signed: Otilio SaberM Lindsey Stanbery 01/01/2015, 3:49 PM

## 2015-01-01 NOTE — Progress Notes (Signed)
Utilization review completed.  

## 2015-01-01 NOTE — Anesthesia Preprocedure Evaluation (Addendum)
Anesthesia Evaluation  Patient identified by MRN, date of birth, ID band Patient awake    Reviewed: Allergy & Precautions, NPO status , Patient's Chart, lab work & pertinent test results  Airway Mallampati: II  TM Distance: >3 FB Neck ROM: Full    Dental  (+) Dental Advisory Given   Pulmonary former smoker,    breath sounds clear to auscultation       Cardiovascular hypertension, Pt. on medications + dysrhythmias Atrial Fibrillation  Rhythm:Regular Rate:Normal     Neuro/Psych negative neurological ROS     GI/Hepatic negative GI ROS, Neg liver ROS,   Endo/Other  negative endocrine ROS  Renal/GU Renal InsufficiencyRenal disease     Musculoskeletal  (+) Arthritis ,   Abdominal   Peds  Hematology negative hematology ROS (+)   Anesthesia Other Findings   Reproductive/Obstetrics                               Component Value Date/Time   WBC 7.4 12/31/2014 1022   RBC 5.37 12/31/2014 1022   HGB 17.8* 12/31/2014 1022   HCT 52.0 12/31/2014 1022   PLT 216 12/31/2014 1022   LABPROT 14.2 12/31/2014 1022   INR 1.08 12/31/2014 1022   APTT 28 12/31/2014 1022      Component Value Date/Time   NA 142 12/31/2014 1022   K 3.9 12/31/2014 1022   CL 107 12/31/2014 1022   CO2 28 12/31/2014 1022   BUN 14 12/31/2014 1022   CREATININE 1.37* 12/31/2014 1022   CALCIUM 9.5 12/31/2014 1022   ALKPHOS 100 12/31/2014 1022   AST 23 12/31/2014 1022   ALT 20 12/31/2014 1022   BILITOT 0.9 12/31/2014 1022      Anesthesia Physical Anesthesia Plan  ASA: II  Anesthesia Plan: General and Regional   Post-op Pain Management: GA combined w/ Regional for post-op pain   Induction: Intravenous  Airway Management Planned: Oral ETT  Additional Equipment:   Intra-op Plan:   Post-operative Plan: Extubation in OR  Informed Consent: I have reviewed the patients History and Physical, chart, labs and discussed the  procedure including the risks, benefits and alternatives for the proposed anesthesia with the patient or authorized representative who has indicated his/her understanding and acceptance.     Plan Discussed with: CRNA  Anesthesia Plan Comments:         Anesthesia Quick Evaluation

## 2015-01-01 NOTE — Anesthesia Postprocedure Evaluation (Signed)
Anesthesia Post Note  Patient: Antonietta BarcelonaMichael Kuhner  Procedure(s) Performed: Procedure(s) (LRB): RIGHT TOTAL SHOULDER ARTHROPLASTY (Right)  Patient location during evaluation: PACU Anesthesia Type: General Level of consciousness: awake and alert Pain management: pain level controlled Vital Signs Assessment: post-procedure vital signs reviewed and stable Respiratory status: spontaneous breathing, nonlabored ventilation, respiratory function stable and patient connected to nasal cannula oxygen Cardiovascular status: blood pressure returned to baseline and stable Postop Assessment: no signs of nausea or vomiting Anesthetic complications: no    Last Vitals:  Filed Vitals:   01/01/15 1247 01/01/15 1300  BP: 155/85   Pulse: 74 66  Temp:    Resp: 21 26    Last Pain: There were no vitals filed for this visit.               Nejla Reasor DAVID

## 2015-01-01 NOTE — Interval H&P Note (Signed)
History and Physical Interval Note:  01/01/2015 1:33 PM  Curtis Wagner  has presented today for surgery, with the diagnosis of PRIMARY OSTEOARTHRITIS, RIGHT SHOULDER  The various methods of treatment have been discussed with the patient and family. After consideration of risks, benefits and other options for treatment, the patient has consented to  Procedure(s): RIGHT TOTAL SHOULDER ARTHROPLASTY (Right) as a surgical intervention .  The patient's history has been reviewed, patient examined, no change in status, stable for surgery.  I have reviewed the patient's chart and labs.  Questions were answered to the patient's satisfaction.     Loreta Aveaniel F Jonell Brumbaugh

## 2015-01-01 NOTE — Anesthesia Procedure Notes (Addendum)
Procedure Name: Intubation Date/Time: 01/01/2015 1:46 PM Performed by: Faustino CongressWHITE, KELSEY TENA WEAVER Pre-anesthesia Checklist: Patient identified, Emergency Drugs available, Suction available and Patient being monitored Patient Re-evaluated:Patient Re-evaluated prior to inductionOxygen Delivery Method: Circle system utilized Preoxygenation: Pre-oxygenation with 100% oxygen Intubation Type: IV induction Ventilation: Mask ventilation without difficulty Laryngoscope Size: Mac and 3 Grade View: Grade I Tube type: Oral Tube size: 8.0 mm Number of attempts: 1 Airway Equipment and Method: Stylet Placement Confirmation: ETT inserted through vocal cords under direct vision,  positive ETCO2 and breath sounds checked- equal and bilateral Secured at: 23 cm Tube secured with: Tape Dental Injury: Teeth and Oropharynx as per pre-operative assessment    Anesthesia Regional Block:  Interscalene brachial plexus block  Pre-Anesthetic Checklist: ,, timeout performed, Correct Patient, Correct Site, Correct Laterality, Correct Procedure, Correct Position, site marked, Risks and benefits discussed,  Surgical consent,  Pre-op evaluation,  At surgeon's request and post-op pain management  Laterality: Right  Prep: chloraprep       Needles:  Injection technique: Single-shot  Needle Type: Echogenic Stimulator Needle     Needle Length: 9cm 9 cm Needle Gauge: 21 and 21 G    Additional Needles:  Procedures: ultrasound guided (picture in chart) and nerve stimulator Interscalene brachial plexus block  Nerve Stimulator or Paresthesia:  Response: bicep, 0.5 mA,   Additional Responses:   Narrative:  Start time: 01/01/2015 1:14 PM End time: 01/01/2015 1:22 PM Injection made incrementally with aspirations every 5 mL.  Performed by: Personally  Anesthesiologist: Marcene DuosFITZGERALD, Shonn Farruggia  Additional Notes: Risks and benefits discussed. Pt tolerated well with no immediate complications.

## 2015-01-02 DIAGNOSIS — I480 Paroxysmal atrial fibrillation: Secondary | ICD-10-CM | POA: Diagnosis present

## 2015-01-02 DIAGNOSIS — E785 Hyperlipidemia, unspecified: Secondary | ICD-10-CM | POA: Diagnosis present

## 2015-01-02 DIAGNOSIS — I10 Essential (primary) hypertension: Secondary | ICD-10-CM | POA: Diagnosis present

## 2015-01-02 LAB — CBC
HEMATOCRIT: 42.7 % (ref 39.0–52.0)
HEMOGLOBIN: 14.5 g/dL (ref 13.0–17.0)
MCH: 33 pg (ref 26.0–34.0)
MCHC: 34 g/dL (ref 30.0–36.0)
MCV: 97 fL (ref 78.0–100.0)
Platelets: 173 10*3/uL (ref 150–400)
RBC: 4.4 MIL/uL (ref 4.22–5.81)
RDW: 13.7 % (ref 11.5–15.5)
WBC: 8 10*3/uL (ref 4.0–10.5)

## 2015-01-02 NOTE — Op Note (Signed)
NAMTim Lair:  Curtis Wagner, Curtis Wagner              ACCOUNT NO.:  [id number]  MEDICAL RECORD NO.:  [id number]  LOCATION:  [id number]                        FACILITY:  [facility]  PHYSICIAN:  Loreta Aveaniel F. Sherlyn Ebbert, M.D. DATE OF BIRTH:  [birth year]  DATE OF PROCEDURE:  01/01/2015 DATE OF DISCHARGE:                              OPERATIVE REPORT   PREOPERATIVE DIAGNOSIS:  Right shoulder end-stage arthritis, primary localized.  POSTOPERATIVE DIAGNOSIS:  Right shoulder end-stage arthritis, primary localized.  PROCEDURE:  Right total shoulder replacement, utilizing Stryker prosthesis.  A cemented pegged 48 mm glenoid component.  Press-fit 14 mm stem with 48 x 21 metallic head.  SURGEON:  Loreta Aveaniel F. Sueanne Maniaci, M.D.  ASSISTANT:  Mikey KirschnerLindsey Stanberry, PA present throughout the entire case and necessary for timely completion of procedure.  ANESTHESIA:  General.  BLOOD LOSS:  Minimal.  SPECIMENS:  None.  COMPLICATIONS:  None.  DRESSINGS:  Soft compressive shoulder immobilizer.  DESCRIPTION OF PROCEDURE:  The patient was brought to the operating room, placed on the operating table in supine position.  After adequate anesthesia had been obtained, prepped and draped in usual sterile fashion, having been placed in a beach-chair position on the shoulder positioner.  Deltopectoral incision.  Skin and subcutaneous tissue divided.  Deltopectoral interval opened.  Conjoined tendon elevated, retractors put in place.  Subscapularis taken down retracted medially. Humeral head, which is very deformed and flat was cut at 30 degrees of retroversion.  All spurs removed.  Glenoid exposed.  This was eroded posteriorly, brought down to a flat surface, drill sized and fitted for a 48 mm pegged component.  Once the trials were fitted well that was removed, wound irrigated, cement prepared, and the definitive component was cemented in place.  The humerus was then exposed.  Hand-held reamers and broaches.  Brought up to good fitting with a  14 mm stem.  After appropriate trials, I seated the stem and attached a 48 x 21 head.  With this construct, I had great stability, nice congruent full motion. Wound irrigated.  Subscapularis repaired anatomically with FiberWire. Deltopectoral interval closed.  Subcutaneous and subcuticular closure.  Margins were injected with Marcaine.  Sterile compressive dressing applied.  Shoulder immobilizer applied.  Anesthesia reversed.  Brought to the recovery room.  Tolerated the surgery well. No complications.     Loreta Aveaniel F. Orman Matsumura, M.D.     DFM/MEDQ  D:  01/01/2015  T:  01/02/2015  Job:  [id number]

## 2015-01-02 NOTE — Progress Notes (Signed)
[facility] Progress Note  Subjective  Doing great Ready to go home  No complaints  Tingling R thumb and index finger improved  ROS As above  Objective   BP 120/67 mmHg  Pulse 77  Temp(Src) 98.1 F (36.7 C) (Oral)  Resp 17  Wt 95.709 kg (211 lb)  SpO2 95%  Intake/Output      12/09 0701 - 12/10 0700 12/10 0701 - 12/11 0700   P.O. 200    I.V. (mL/kg) 1941.7 (20.3)    IV Piggyback 50 50   Total Intake(mL/kg) 2191.7 (22.9) 50 (0.5)   Urine (mL/kg/hr) 0    Blood 200    Total Output 200     Net +1991.7 +50        Urine Occurrence 2 x      Labs Results for Curtis Wagner, Curtis (MRN [id number]) as of 01/02/2015 10:18  Ref. Range 01/02/2015 02:47  WBC Latest Ref Range: 4.0-10.5 K/uL 8.0  RBC Latest Ref Range: 4.22-5.81 MIL/uL 4.40  Hemoglobin Latest Ref Range: 13.0-17.0 g/dL 11.914.5  HCT Latest Ref Range: 39.0-52.0 % 42.7  MCV Latest Ref Range: 78.0-100.0 fL 97.0  MCH Latest Ref Range: 26.0-34.0 pg 33.0  MCHC Latest Ref Range: 30.0-36.0 g/dL 14.734.0  RDW Latest Ref Range: 11.5-15.5 % 13.7  Platelets Latest Ref Range: 150-400 K/uL 173    Exam  Gen: resting comfortably in chair, NAD Ext:       Right Upper Extremity   Sling in place  Dressing c/d/i  Ext warm   R/U/M/AIN/PIN motor functions intact  R/U/M sensation intact  + Radial pulse    Assessment and Plan   POD/HD#: 1  74 y/o male s/p R total shoulder arthroplasty  Ok to dc home today NWB R upper extremity Sling at all times until follow up with Dr.Murphy per signout instructions No PT until after follow up per signout instructions Do not change dressing until office follow up  Follow up with dr. Eulah PontMurphy in 1 week- schedule appointment      Mearl LatinKeith W. Collis Thede, PA-C [facility] [phone] (P) [phone] (O) 01/02/2015 10:18 AM

## 2015-01-02 NOTE — Discharge Instructions (Signed)
HOME INSTRUCTIONS Wear sling at all times for the next 6 weeks May shower on Monday but do not get bandage wet. Do not change bandage Only take over-the-counter or prescription medicines for pain, discomfort, or fever as directed by your caregiver.  Eat a well-balanced diet.  Avoid lifting or driving until you are instructed otherwise.  Make an appointment to see your caregiver for stitches (suture) or staple removal one week after surgery.   SEEK MEDICAL CARE IF: You have swelling of your calf or leg.  You develop shortness of breath or chest pain.  You have redness, swelling, or increasing pain in the wound.  There is pus or any unusual drainage coming from the surgical site.  You notice a bad smell coming from the surgical site or dressing.  The surgical site breaks open after sutures or staples have been removed.  There is persistent bleeding from the suture or staple line.  You are getting worse or are not improving.  You have any other questions or concerns.  SEEK IMMEDIATE MEDICAL CARE IF:  You have a fever greater than 101 You develop a rash.  You have difficulty breathing.  You develop any reaction or side effects to medicines given.  Your knee motion is decreasing rather than improving.  MAKE SURE YOU:  Understand these instructions.  Will watch your condition.  Will get help right away if you are not doing well or get worse.

## 2015-01-02 NOTE — Evaluation (Signed)
Occupational Therapy Evaluation Patient Details Name: Curtis Wagner MRN: [id number] DOB: Apr 19, 1940 Today's Date: 01/02/2015    History of Present Illness Pt is a 74 y.o. male s/p RIGHT TOTAL SHOULDER ARTHROPLASTY on 01/01/2015. PMH: PAF, DVT, HTN, arithritis, hyperlipidemia     Clinical Impression   Pt reports he was independent with ADLs and mobility PTA. Currently pt is overall supervision for ADLs and functional mobility with the exception of min assist for UB ADLs and sling management. ADL, safety, and shoulder education completed with pt; he has no further questions or concerns for OT at this time. Pt planning to d/c to his daughters home with 24/7 supervision from family. Pt is ready to d/c home from an OT standpoint but could benefit from continued acute skilled OT services to maximize independence with UB and LB ADLs; will continue to follow pt acutely.     Follow Up Recommendations  Supervision - Intermittent (follow up per MD)    Equipment Recommendations  None recommended by OT    Recommendations for Other Services       Precautions / Restrictions Precautions Precautions: Shoulder;Fall Type of Shoulder Precautions: Conservative protocol: NO ROM shoulder. NO AROM elbow, wrist, hand. Sling on when bathing. Shoulder Interventions: Shoulder sling/immobilizer;At all times Precaution Booklet Issued: Yes (comment) Precaution Comments: Reviewed precautions Required Braces or Orthoses: Sling Restrictions Weight Bearing Restrictions: Yes RUE Weight Bearing: Non weight bearing      Mobility Bed Mobility Overal bed mobility: Needs Assistance Bed Mobility: Supine to Sit     Supine to sit: Supervision     General bed mobility comments: Supervision for safety; no physical assist required. HOB flat to simulate home environment. VC for NWB through RUE.  Transfers Overall transfer level: Needs assistance Equipment used: None Transfers: Sit to/from Stand Sit to Stand:  Supervision         General transfer comment: Supervision for safety; no physical assist needed. Pt with good technique and hand placement. Sit to stand from EOB x 1, toilet x 1.    Balance Overall balance assessment: No apparent balance deficits (not formally assessed)                                          ADL Overall ADL's : Needs assistance/impaired Eating/Feeding: Set up;Sitting   Grooming: Wash/dry hands;Supervision/safety;Standing   Upper Body Bathing: Minimal assitance;Standing   Lower Body Bathing: Supervison/ safety;Sit to/from stand Lower Body Bathing Details (indicate cue type and reason): Educated pt on use of sling in shower to support RUE.  Upper Body Dressing : Minimal assistance;Sitting   Lower Body Dressing: Supervision/safety;Sit to/from stand Lower Body Dressing Details (indicate cue type and reason): Pt able to doff/don bilateral socks. Toilet Transfer: Supervision/safety;Ambulation;Comfort height toilet       Tub/ Shower Transfer: Supervision/safety;Walk-in shower;Ambulation   Functional mobility during ADLs: Supervision/safety General ADL Comments: No family present for OT eval. Educated pt on precautions, sling management and wear schedule, RUE positioning, UB bathing and dressing technique, edema management technique; pt verbalized understanding.     Vision     Perception     Praxis      Pertinent Vitals/Pain Pain Assessment: 0-10 Pain Score: 6  Pain Location: R shoulder Pain Descriptors / Indicators: Aching;Sore Pain Intervention(s): Limited activity within patient's tolerance;Monitored during session;Repositioned;Ice applied     Hand Dominance Right   Extremity/Trunk Assessment Upper Extremity Assessment Upper Extremity Assessment:  RUE deficits/detail;LUE deficits/detail RUE Deficits / Details: Finger AROM WFL.  RUE: Unable to fully assess due to immobilization LUE Deficits / Details: Slight decrease in shoulder  AROM but WFL.   Lower Extremity Assessment Lower Extremity Assessment: Overall WFL for tasks assessed   Cervical / Trunk Assessment Cervical / Trunk Assessment: Normal   Communication Communication Communication: No difficulties   Cognition Arousal/Alertness: Awake/alert Behavior During Therapy: WFL for tasks assessed/performed Overall Cognitive Status: Within Functional Limits for tasks assessed                     General Comments       Exercises Exercises: Shoulder     Shoulder Instructions Shoulder Instructions Donning/doffing shirt without moving shoulder: Minimal assistance;Patient able to independently direct caregiver (education completed) Method for sponge bathing under operated UE: Minimal assistance;Patient able to independently direct caregiver (education completed) Donning/doffing sling/immobilizer: Minimal assistance;Patient able to independently direct caregiver (education completed) Correct positioning of sling/immobilizer: Minimal assistance;Patient able to independently direct caregiver (education completed) Sling wearing schedule (on at all times/off for ADL's): Modified independent;Patient able to independently direct caregiver (education completed) Proper positioning of operated UE when showering: Minimal assistance;Patient able to independently direct caregiver (education completed) Positioning of UE while sleeping: Minimal assistance;Patient able to independently direct caregiver (education completed)    Home Living Family/patient expects to be discharged to:: Private residence Living Arrangements: Alone Available Help at Discharge: Family;Available 24 hours/day (Planning to stay with daughter for awhile) Type of Home: House Home Access: Level entry     Home Layout: One level     Bathroom Shower/Tub: Producer, television/film/videoWalk-in shower   Bathroom Toilet: Standard Bathroom Accessibility: Yes   Home Equipment: None (Has a built in shower seat at his house)    Additional Comments: Pt is planning to stay with his daughter for awhile before returning home. Daughter is an Charity fundraiserN. Above information reflects daughters home.      Prior Functioning/Environment Level of Independence: Independent             OT Diagnosis: Acute pain   OT Problem List: Decreased safety awareness;Decreased knowledge of precautions;Impaired UE functional use;Pain   OT Treatment/Interventions: Self-care/ADL training;Patient/family education    OT Goals(Current goals can be found in the care plan section) Acute Rehab OT Goals Patient Stated Goal: return to being independent OT Goal Formulation: With patient Time For Goal Achievement: 01/16/15 Potential to Achieve Goals: Good ADL Goals Pt Will Perform Upper Body Bathing: with supervision;sitting;standing Pt Will Perform Lower Body Bathing: with supervision;sit to/from stand Pt Will Perform Upper Body Dressing: with supervision;sitting;standing Pt Will Perform Lower Body Dressing: with supervision;sit to/from stand  OT Frequency: Min 2X/week   Barriers to D/C:            Co-evaluation              End of Session Equipment Utilized During Treatment: Gait belt;Other (comment) (sling) Nurse Communication: Mobility status;Weight bearing status  Activity Tolerance: Patient tolerated treatment well Patient left: in chair;with call bell/phone within reach   Time: 0832-0901 OT Time Calculation (min): 29 min Charges:  OT General Charges $OT Visit: 1 Procedure OT Evaluation $Initial OT Evaluation Tier I: 1 Procedure OT Treatments $Self Care/Home Management : 8-22 mins G-Codes:     Gaye AlkenBailey A Lillyana Majette M.S., OTR/L Pager: [phone]  01/02/2015, 9:14 AM

## 2015-01-04 ENCOUNTER — Encounter (HOSPITAL_COMMUNITY): Payer: Self-pay | Admitting: Orthopedic Surgery

## 2015-03-23 NOTE — Progress Notes (Signed)
Cardiology Office Note:    Date:  03/24/2015   ID:  Curtis Wagner, DOB [birth year], MRN [id number]  PCP:  Jonnie Kind, DO  Cardiologist:  Dr. Dietrich Pates   Electrophysiologist:  n/a  Chief Complaint  Patient presents with  . Atrial Fibrillation    Follow up    History of Present Illness:     Curtis Wagner is a 75 y.o. male with a hx of chronic AFib, prior DVT, HTN, HL.  He failed Sotalol in the past.  He was not a candidate for Tikosyn.  He declined Amio due to SEs and age.  Last seen by Dr. Dietrich Pates 9/16.  He had R shoulder surgery in 12/16.    He lives in [place], [place]. Here today to have his shoulder rechecked since his surgery. Decided to make FU appt here so he did not have to travel back later.  He is doing well. Works out 5-6 days a week.  Denies chest pain, dyspnea, syncope, orthopnea, PND, edema.  No syncope.  No bleeding.  He ran out of Digoxin a few days ago.     Past Medical History  Diagnosis Date  . PAF (paroxysmal atrial fibrillation) (HCC)   . DVT (deep venous thrombosis) (HCC)   . Hypertension   . Hyperlipidemia   . 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 History of kidney stones     Past Surgical History  Procedure Laterality Date  . Shoulder surgery Left 1961  . Tonsillectomy    . Eye surgery Right     /w IOL  . Total shoulder arthroplasty Right 01/01/2015    Procedure: RIGHT TOTAL SHOULDER ARTHROPLASTY;  Surgeon: Loreta Ave, MD;  Location: [facility] OR;  Service: Orthopedics;  Laterality: Right;    Current Medications: Outpatient Prescriptions Prior to Visit  Medication Sig Dispense Refill  . allopurinol (ZYLOPRIM) 300 MG tablet Take 300 mg by mouth daily.    . Multiple Vitamins-Minerals (CENTRUM SILVER ADULT 50+) TABS Take 1 tablet by mouth daily.    . digoxin (DIGOX) 0.125 MG tablet Take 0.125 mg by mouth daily.    Marland Kitchen diltiazem (CARDIZEM CD) 180 MG 24 hr capsule Take 180 mg by mouth every morning.    .  diltiazem (DILTIAZEM CD) 120 MG 24 hr capsule Take 120 mg by mouth every evening.    Marland Kitchen lisinopril (PRINIVIL,ZESTRIL) 10 MG tablet Take 10 mg by mouth daily after supper.     . rivaroxaban (XARELTO) 20 MG TABS tablet Take 20 mg by mouth daily with supper.    Marland Kitchen HYDROcodone-acetaminophen (NORCO) 7.5-325 MG tablet Take 1-2 tablets by mouth every 4 (four) hours as needed for moderate pain. (Patient not taking: Reported on 03/24/2015) 60 tablet 0  . ondansetron (ZOFRAN) 4 MG tablet Take 1 tablet (4 mg total) by mouth every 8 (eight) hours as needed for nausea or vomiting. (Patient not taking: Reported on 03/24/2015) 40 tablet 0   No facility-administered medications prior to visit.     Allergies:   Morphine and related   Social History   Social History  . Marital Status: Divorced    Spouse Name: N/A  . Number of Children: N/A  . Years of Education: N/A   Social History Main Topics  . Smoking status: Former Games developer  . Smokeless tobacco: Former Neurosurgeon    Types: Chew  . Alcohol Use: No  . Drug Use: No  . Sexual Activity: Not Asked  Other Topics Concern  . None   Social History Narrative     Family History:  The patient's family history includes Heart disease in his mother; Rheumatic fever in his mother; Stroke in his father and mother.   ROS:   Please see the history of present illness.    Review of Systems  Musculoskeletal: Positive for back pain.  Genitourinary: Positive for incomplete emptying.  Neurological: Positive for dizziness.  All other systems reviewed and are negative.   Physical Exam:    VS:  BP 140/80 mmHg  Pulse 102  Ht 5\' 11"  (1.803 m)  Wt 200 lb (90.719 kg)  BMI 27.91 kg/m2   GEN: Well nourished, well developed, in no acute distress HEENT: normal Neck: no JVD, no masses Cardiac: Normal S1/S2, irregularly irregular rhythm; no murmurs,  no edema    Respiratory:  clear to auscultation bilaterally; no wheezing, rhonchi or rales GI: soft, nontender  MS: no  deformity or atrophy Skin: warm and dry  Neuro:  no focal deficits  Psych: Alert and oriented x 3, normal affect   Wt Readings from Last 3 Encounters:  03/24/15 200 lb (90.719 kg)  01/01/15 211 lb (95.709 kg)  12/31/14 211 lb 6.4 oz (95.89 kg)      Studies/Labs Reviewed:     EKG:  EKG is  ordered today.  The ekg ordered today demonstrates A. fib, HR 102  Recent Labs: 12/31/2014: ALT 20; BUN 14; Creatinine, Ser 1.37*; Potassium 3.9; Sodium 142 01/02/2015: Hemoglobin 14.5; Platelets 173   Recent Lipid Panel No results found for: CHOL, TRIG, HDL, CHOLHDL, VLDL, LDLCALC, LDLDIRECT  Additional studies/ records that were reviewed today include:   Echo 6/16 EF 68%, no RWMA, MAC, mild AI, mild MR, mild TR, PASP 40 mmHg, Gr 1 DD  Stress Test 6/14 No ST changes   ASSESSMENT:     1. Chronic atrial fibrillation (HCC)   2. Essential hypertension   3. CKD (chronic kidney disease) stage 3, GFR 30-59 ml/min     PLAN:     In order of problems listed above:  1. Chronic Atrial Fibrillation - Rate is uncontrolled today.  But, he has been off of Dig for several days.  He usually has well controlled HR. I have asked him to resume Digoxin .1025 mg QD. Continue current dose of Diltiazem.  Arrange FU BMET, CBC, Dig level with PCP in [place] in 3 mos.  FU with Dr. Tenny Craw in 1 year.   2. HTN - Borderline control.  Continue to monitor.    3. CKD - Creatinine clearance 58.86 mL/min.  Continue Xarelto 20 mg QD.    Medication Adjustments/Labs and Tests Ordered: Current medicines are reviewed at length with the patient today.  Concerns regarding medicines are outlined above.  Medication changes, Labs and Tests ordered today are outlined in the Patient Instructions noted below. Patient Instructions  Medication Instructions:  Your physician recommends that you continue on your current medications as directed. Please refer to the Current Medication list given to you today.  Labwork: YOU HAVE  BEEN GIVEN AN RX FOR LAB WORK TO BE DONE IN 3 MONTHS (BMET, CBC, DIGOXIN LEVEL) YOU WILL NEED TO MAKE SURE TO HOLD YOUR MORNING DOSE OF DIGOXIN  Testing/Procedures: NONE  Follow-Up: Your physician wants you to follow-up in: 1 YEAR WITH DR. Gunnar Fusi ROSS. You will receive a reminder letter in the mail two months in advance. If you don't receive a letter, please call our office to schedule the follow-up  appointment.  Any Other Special Instructions Will Be Listed Below (If Applicable).  If you need a refill on your cardiac medications before your next appointment, please call your pharmacy.   Signed, Tereso Newcomer, PA-C  03/24/2015 12:49 PM    [facility] [address] [place], [place], [place]  [address] Phone: [phone]; Fax: [phone]

## 2015-03-24 ENCOUNTER — Encounter: Payer: Self-pay | Admitting: Physician Assistant

## 2015-03-24 ENCOUNTER — Ambulatory Visit (INDEPENDENT_AMBULATORY_CARE_PROVIDER_SITE_OTHER): Payer: Medicare Other | Admitting: Physician Assistant

## 2015-03-24 VITALS — BP 140/80 | HR 102 | Ht 71.0 in | Wt 200.0 lb

## 2015-03-24 DIAGNOSIS — I480 Paroxysmal atrial fibrillation: Secondary | ICD-10-CM | POA: Diagnosis not present

## 2015-03-24 DIAGNOSIS — I10 Essential (primary) hypertension: Secondary | ICD-10-CM

## 2015-03-24 DIAGNOSIS — I482 Chronic atrial fibrillation, unspecified: Secondary | ICD-10-CM

## 2015-03-24 DIAGNOSIS — M25511 Pain in right shoulder: Secondary | ICD-10-CM | POA: Diagnosis not present

## 2015-03-24 DIAGNOSIS — N183 Chronic kidney disease, stage 3 unspecified: Secondary | ICD-10-CM

## 2015-03-24 MED ORDER — DILTIAZEM HCL ER COATED BEADS 180 MG PO CP24: 180.0000 mg | ORAL_CAPSULE | Freq: Every morning | ORAL | Status: DC

## 2015-03-24 MED ORDER — DILTIAZEM HCL ER COATED BEADS 120 MG PO CP24: 120.0000 mg | ORAL_CAPSULE | Freq: Every evening | ORAL | Status: DC

## 2015-03-24 MED ORDER — DIGOXIN 125 MCG PO TABS: 0.1250 mg | ORAL_TABLET | Freq: Every day | ORAL | Status: DC

## 2015-03-24 MED ORDER — LISINOPRIL 10 MG PO TABS: 10.0000 mg | ORAL_TABLET | Freq: Every day | ORAL | Status: DC

## 2015-03-24 MED ORDER — RIVAROXABAN 20 MG PO TABS: 20.0000 mg | ORAL_TABLET | Freq: Every day | ORAL | Status: DC

## 2015-03-24 NOTE — Patient Instructions (Addendum)
Medication Instructions:  Your physician recommends that you continue on your current medications as directed. Please refer to the Current Medication list given to you today.  Labwork: YOU HAVE BEEN GIVEN AN RX FOR LAB WORK TO BE DONE IN 3 MONTHS (BMET, CBC, DIGOXIN LEVEL) YOU WILL NEED TO MAKE SURE TO HOLD YOUR MORNING DOSE OF DIGOXIN  Testing/Procedures: NONE  Follow-Up: Your physician wants you to follow-up in: 1 YEAR WITH DR. Gunnar Fusi ROSS. You will receive a reminder letter in the mail two months in advance. If you don't receive a letter, please call our office to schedule the follow-up appointment.  Any Other Special Instructions Will Be Listed Below (If Applicable).  If you need a refill on your cardiac medications before your next appointment, please call your pharmacy.

## 2016-05-31 ENCOUNTER — Encounter: Payer: Self-pay | Admitting: Internal Medicine

## 2016-06-15 NOTE — Progress Notes (Signed)
Cardiology Office Note   Date:  06/16/2016   ID:  Curtis BarcelonaMichael Noguez, DOB 12-29-1940, MRN [id number]  PCP:  Jonnie KindHamby, Kenneth J, DO  Cardiologist:   Dietrich PatesPaula Ayan Yankey, MD   F/u of chronic atrial fib     History of Present Illness: Curtis Wagner is a 76 y.o. male with a history of chronic atrial fib, DVT, HTN, HL  Failed Sotalol  Not a Tikosyn candidate  Declined amio  I saw him in Sept 2016  He  was seen by Wende MottS Weaver in march 2017    Pt says he goes to the gym 6x per wk    Activity level is stable   Does have some shoulder problems  Denies dizziness  No CP  Breathing OK        Current Meds  Medication Sig  . allopurinol (ZYLOPRIM) 300 MG tablet Take 300 mg by mouth daily.  . digoxin (DIGOX) 0.125 MG tablet Take 1 tablet (0.125 mg total) by mouth daily.  Marland Kitchen. diltiazem (CARDIZEM CD) 180 MG 24 hr capsule Take 1 capsule (180 mg total) by mouth every morning.  . diltiazem (DILTIAZEM CD) 120 MG 24 hr capsule Take 1 capsule (120 mg total) by mouth every evening.  Marland Kitchen. lisinopril (PRINIVIL,ZESTRIL) 10 MG tablet Take 1 tablet (10 mg total) by mouth daily after supper.  . Multiple Vitamins-Minerals (CENTRUM SILVER ADULT 50+) TABS Take 1 tablet by mouth daily.  . rivaroxaban (XARELTO) 20 MG TABS tablet Take 1 tablet (20 mg total) by mouth daily with supper.  . tamsulosin (FLOMAX) 0.4 MG CAPS capsule Take 0.4 mg by mouth at bedtime. Reported on 03/24/2015     Allergies:   Morphine and Morphine and related   Past Medical History:  Diagnosis Date  . 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 DVT (deep venous thrombosis) (HCC)   . History of kidney stones   . Hyperlipidemia   . Hypertension   . PAF (paroxysmal atrial fibrillation) (HCC)     Past Surgical History:  Procedure Laterality Date  . EYE SURGERY Right    /w IOL  . SHOULDER SURGERY Left 1961  . TONSILLECTOMY    . 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 He has quit using smokeless tobacco. His smokeless tobacco use included Chew. He reports that he does not drink alcohol or use drugs.   Family History:  The patient's family history includes Heart disease in his mother; Rheumatic fever in his mother; Stroke in his father and mother.    ROS:  Please see the history of present illness. All other systems are reviewed and  Negative to the above problem except as noted.    PHYSICAL EXAM: VS:  BP (!) 142/78   Pulse 70   Ht 5\' 11"  (1.803 m)   Wt 204 lb 12.8 oz (92.9 kg)   BMI 28.56 kg/m   GEN: Well nourished, well developed, in no acute distress  HEENT: normal  Neck: no JVD, carotid bruits, or masses Cardiac: RRR; no murmurs, rubs, or gallops,no edema  Respiratory:  clear to auscultation bilaterally, normal work of breathing GI: soft, nontender, nondistended, + BS  No hepatomegaly  MS: no deformity Moving all extremities   Skin: warm and dry,  no rash Neuro:  Strength and sensation are intact Psych: euthymic mood, full affect   EKG:  EKG is ordered today.  Atrial fibrillation  70bpm  Nonspecific ST changes     Lipid Panel No results found for: CHOL, TRIG, HDL, CHOLHDL, VLDL, LDLCALC, LDLDIRECT    Wt Readings from Last 3 Encounters:  06/16/16 204 lb 12.8 oz (92.9 kg)  03/24/15 200 lb (90.7 kg)  01/01/15 211 lb (95.7 kg)      ASSESSMENT AND PLAN:  1  Permanent atrial fibrillation   Continue on rate control and anticoagulation  Check labs today   2  HTN  BP is OK  Continue meds   Pt lives in [place], [place]  Would like to see cardiology closer to home  WIll try to refer locally     Current medicines are reviewed at length with the patient today.  The patient does not have concerns regarding medicines.  Signed, Dietrich Pates, MD  06/16/2016 3:09 PM    [facility] [address] [place], [place], [place]  [address] Phone: [phone]; Fax: [phone]

## 2016-06-16 ENCOUNTER — Encounter: Payer: Self-pay | Admitting: Internal Medicine

## 2016-06-16 ENCOUNTER — Encounter (INDEPENDENT_AMBULATORY_CARE_PROVIDER_SITE_OTHER): Payer: Self-pay

## 2016-06-16 ENCOUNTER — Ambulatory Visit (INDEPENDENT_AMBULATORY_CARE_PROVIDER_SITE_OTHER): Payer: Medicare Other | Admitting: Internal Medicine

## 2016-06-16 VITALS — BP 142/78 | HR 70 | Ht 71.0 in | Wt 204.8 lb

## 2016-06-16 DIAGNOSIS — I480 Paroxysmal atrial fibrillation: Secondary | ICD-10-CM | POA: Diagnosis not present

## 2016-06-16 DIAGNOSIS — I10 Essential (primary) hypertension: Secondary | ICD-10-CM | POA: Diagnosis not present

## 2016-06-16 NOTE — Patient Instructions (Signed)
Your physician recommends that you continue on your current medications as directed. Please refer to the Current Medication list given to you today.  Your physician recommends that you return for lab work today (bmet, cbc, digoxin level, lipids)   Your physician recommends that you schedule a follow-up appointment as needed with Dr. Tenny Crawoss.

## 2016-06-17 LAB — LIPID PANEL
CHOLESTEROL TOTAL: 175 mg/dL (ref 100–199)
Chol/HDL Ratio: 3.8 ratio (ref 0.0–5.0)
HDL: 46 mg/dL (ref >39)
LDL Calculated: 108 mg/dL — ABNORMAL HIGH (ref 0–99)
TRIGLYCERIDES: 103 mg/dL (ref 0–149)
VLDL CHOLESTEROL CAL: 21 mg/dL (ref 5–40)

## 2016-06-17 LAB — BASIC METABOLIC PANEL
BUN/Creatinine Ratio: 14 (ref 10–24)
BUN: 18 mg/dL (ref 8–27)
CALCIUM: 9 mg/dL (ref 8.6–10.2)
CHLORIDE: 102 mmol/L (ref 96–106)
CO2: 24 mmol/L (ref 18–29)
Creatinine, Ser: 1.27 mg/dL (ref 0.76–1.27)
GFR calc Af Amer: 62 mL/min/{1.73_m2} (ref >59)
GFR calc non Af Amer: 54 mL/min/{1.73_m2} — ABNORMAL LOW (ref >59)
GLUCOSE: 92 mg/dL (ref 65–99)
Potassium: 4.4 mmol/L (ref 3.5–5.2)
Sodium: 143 mmol/L (ref 134–144)

## 2016-06-17 LAB — CBC
HEMOGLOBIN: 15.4 g/dL (ref 13.0–17.7)
Hematocrit: 44.5 % (ref 37.5–51.0)
MCH: 32.6 pg (ref 26.6–33.0)
MCHC: 34.6 g/dL (ref 31.5–35.7)
MCV: 94 fL (ref 79–97)
PLATELETS: 236 10*3/uL (ref 150–379)
RBC: 4.73 x10E6/uL (ref 4.14–5.80)
RDW: 14.4 % (ref 12.3–15.4)
WBC: 6.7 10*3/uL (ref 3.4–10.8)

## 2016-06-22 NOTE — Progress Notes (Signed)
Called patient and provided contact information [facility] [place], Dr. Shirlean KellyLinda Calhoun.  Pt very appreciative for information and phone call.

## 2017-01-06 IMAGING — CR DG SHOULDER 2+V PORT*R*
1 series · 1 of 1 positions shown · non-contrast
Comparison: None.

CLINICAL DATA: Postop right shoulder arthroplasty.

EXAM:
PORTABLE RIGHT SHOULDER - 2+ VIEW

[AP]
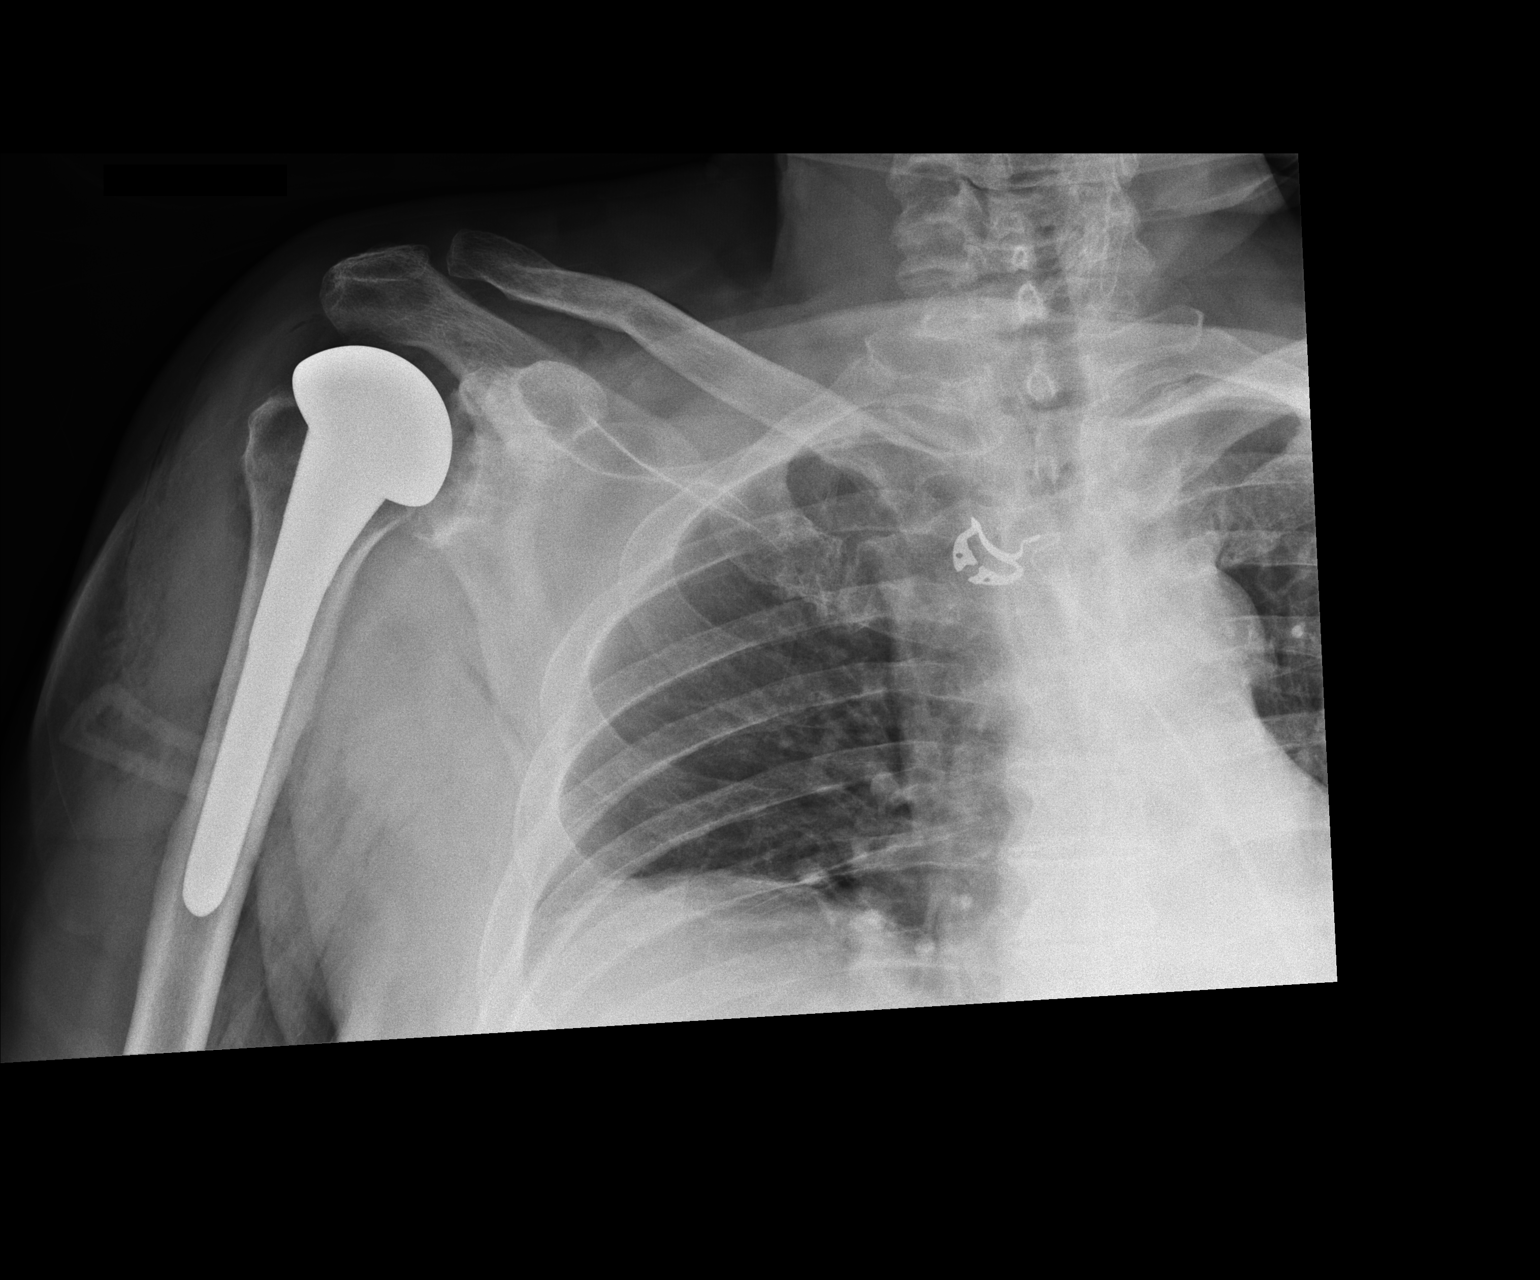

[1 of 1 positions shown; findings below may reference images not displayed]

FINDINGS: 7777 hours. Single AP view demonstrates no complication following
right total shoulder arthroplasty. Specifically, there is no
evidence of acute fracture or dislocation. Mild air is noted within
the shoulder joint. There is atelectasis at the right lung base.
IMPRESSION: No demonstrated complication following right total shoulder
arthroplasty.

## 2021-03-30 DIAGNOSIS — I2699 Other pulmonary embolism without acute cor pulmonale: Secondary | ICD-10-CM | POA: Diagnosis present

## 2021-04-01 DIAGNOSIS — N1832 Chronic kidney disease, stage 3b: Secondary | ICD-10-CM | POA: Diagnosis present

## 2021-04-01 DIAGNOSIS — E1122 Type 2 diabetes mellitus with diabetic chronic kidney disease: Secondary | ICD-10-CM | POA: Diagnosis present

## 2022-04-11 ENCOUNTER — Encounter: Payer: Self-pay | Admitting: Emergency Medicine

## 2022-04-11 ENCOUNTER — Emergency Department: Payer: Medicare PPO

## 2022-04-11 ENCOUNTER — Other Ambulatory Visit: Payer: Self-pay

## 2022-04-11 ENCOUNTER — Inpatient Hospital Stay
Admission: EM | Admit: 2022-04-11 | Discharge: 2022-04-17 | DRG: 271 | Disposition: A | Discharge status: Hospice | Payer: Medicare PPO | Attending: Internal Medicine | Admitting: Internal Medicine

## 2022-04-11 DIAGNOSIS — M1990 Unspecified osteoarthritis, unspecified site: Secondary | ICD-10-CM | POA: Diagnosis present

## 2022-04-11 DIAGNOSIS — R296 Repeated falls: Secondary | ICD-10-CM | POA: Diagnosis present

## 2022-04-11 DIAGNOSIS — I4729 Other ventricular tachycardia: Secondary | ICD-10-CM | POA: Insufficient documentation

## 2022-04-11 DIAGNOSIS — E872 Acidosis, unspecified: Secondary | ICD-10-CM | POA: Diagnosis not present

## 2022-04-11 DIAGNOSIS — E1122 Type 2 diabetes mellitus with diabetic chronic kidney disease: Secondary | ICD-10-CM | POA: Diagnosis present

## 2022-04-11 DIAGNOSIS — Z86711 Personal history of pulmonary embolism: Secondary | ICD-10-CM | POA: Insufficient documentation

## 2022-04-11 DIAGNOSIS — R42 Dizziness and giddiness: Secondary | ICD-10-CM

## 2022-04-11 DIAGNOSIS — Z96611 Presence of right artificial shoulder joint: Secondary | ICD-10-CM | POA: Diagnosis present

## 2022-04-11 DIAGNOSIS — I513 Intracardiac thrombosis, not elsewhere classified: Secondary | ICD-10-CM | POA: Diagnosis present

## 2022-04-11 DIAGNOSIS — Z6825 Body mass index (BMI) 25.0-25.9, adult: Secondary | ICD-10-CM

## 2022-04-11 DIAGNOSIS — F0390 Unspecified dementia without behavioral disturbance: Secondary | ICD-10-CM | POA: Diagnosis present

## 2022-04-11 DIAGNOSIS — W19XXXA Unspecified fall, initial encounter: Secondary | ICD-10-CM | POA: Diagnosis present

## 2022-04-11 DIAGNOSIS — E785 Hyperlipidemia, unspecified: Secondary | ICD-10-CM | POA: Diagnosis present

## 2022-04-11 DIAGNOSIS — Z955 Presence of coronary angioplasty implant and graft: Secondary | ICD-10-CM

## 2022-04-11 DIAGNOSIS — N1831 Chronic kidney disease, stage 3a: Secondary | ICD-10-CM | POA: Insufficient documentation

## 2022-04-11 DIAGNOSIS — N183 Chronic kidney disease, stage 3 unspecified: Secondary | ICD-10-CM | POA: Diagnosis present

## 2022-04-11 DIAGNOSIS — I472 Ventricular tachycardia, unspecified: Secondary | ICD-10-CM | POA: Diagnosis present

## 2022-04-11 DIAGNOSIS — Z79899 Other long term (current) drug therapy: Secondary | ICD-10-CM

## 2022-04-11 DIAGNOSIS — Z7901 Long term (current) use of anticoagulants: Secondary | ICD-10-CM

## 2022-04-11 DIAGNOSIS — I743 Embolism and thrombosis of arteries of the lower extremities: Secondary | ICD-10-CM | POA: Insufficient documentation

## 2022-04-11 DIAGNOSIS — Z515 Encounter for palliative care: Secondary | ICD-10-CM

## 2022-04-11 DIAGNOSIS — I2510 Atherosclerotic heart disease of native coronary artery without angina pectoris: Secondary | ICD-10-CM | POA: Diagnosis present

## 2022-04-11 DIAGNOSIS — Z66 Do not resuscitate: Secondary | ICD-10-CM | POA: Diagnosis not present

## 2022-04-11 DIAGNOSIS — M791 Myalgia, unspecified site: Secondary | ICD-10-CM | POA: Diagnosis present

## 2022-04-11 DIAGNOSIS — M79605 Pain in left leg: Secondary | ICD-10-CM

## 2022-04-11 DIAGNOSIS — E440 Moderate protein-calorie malnutrition: Secondary | ICD-10-CM | POA: Insufficient documentation

## 2022-04-11 DIAGNOSIS — I4901 Ventricular fibrillation: Secondary | ICD-10-CM

## 2022-04-11 DIAGNOSIS — I25118 Atherosclerotic heart disease of native coronary artery with other forms of angina pectoris: Secondary | ICD-10-CM

## 2022-04-11 DIAGNOSIS — I10 Essential (primary) hypertension: Secondary | ICD-10-CM | POA: Diagnosis present

## 2022-04-11 DIAGNOSIS — I2699 Other pulmonary embolism without acute cor pulmonale: Secondary | ICD-10-CM | POA: Diagnosis present

## 2022-04-11 DIAGNOSIS — I420 Dilated cardiomyopathy: Secondary | ICD-10-CM

## 2022-04-11 DIAGNOSIS — I5022 Chronic systolic (congestive) heart failure: Secondary | ICD-10-CM | POA: Insufficient documentation

## 2022-04-11 DIAGNOSIS — E1151 Type 2 diabetes mellitus with diabetic peripheral angiopathy without gangrene: Secondary | ICD-10-CM | POA: Diagnosis present

## 2022-04-11 DIAGNOSIS — Z86718 Personal history of other venous thrombosis and embolism: Secondary | ICD-10-CM

## 2022-04-11 DIAGNOSIS — I70222 Atherosclerosis of native arteries of extremities with rest pain, left leg: Secondary | ICD-10-CM

## 2022-04-11 DIAGNOSIS — E871 Hypo-osmolality and hyponatremia: Secondary | ICD-10-CM | POA: Insufficient documentation

## 2022-04-11 DIAGNOSIS — N179 Acute kidney failure, unspecified: Secondary | ICD-10-CM | POA: Diagnosis present

## 2022-04-11 DIAGNOSIS — E876 Hypokalemia: Secondary | ICD-10-CM | POA: Insufficient documentation

## 2022-04-11 DIAGNOSIS — I272 Pulmonary hypertension, unspecified: Secondary | ICD-10-CM | POA: Insufficient documentation

## 2022-04-11 DIAGNOSIS — R0989 Other specified symptoms and signs involving the circulatory and respiratory systems: Secondary | ICD-10-CM | POA: Diagnosis present

## 2022-04-11 DIAGNOSIS — Z885 Allergy status to narcotic agent status: Secondary | ICD-10-CM

## 2022-04-11 DIAGNOSIS — I4821 Permanent atrial fibrillation: Secondary | ICD-10-CM | POA: Diagnosis present

## 2022-04-11 DIAGNOSIS — N1832 Chronic kidney disease, stage 3b: Secondary | ICD-10-CM | POA: Diagnosis present

## 2022-04-11 DIAGNOSIS — Z7984 Long term (current) use of oral hypoglycemic drugs: Secondary | ICD-10-CM

## 2022-04-11 DIAGNOSIS — D696 Thrombocytopenia, unspecified: Secondary | ICD-10-CM | POA: Insufficient documentation

## 2022-04-11 DIAGNOSIS — R319 Hematuria, unspecified: Secondary | ICD-10-CM | POA: Diagnosis not present

## 2022-04-11 DIAGNOSIS — Z87891 Personal history of nicotine dependence: Secondary | ICD-10-CM

## 2022-04-11 DIAGNOSIS — Z7902 Long term (current) use of antithrombotics/antiplatelets: Secondary | ICD-10-CM

## 2022-04-11 DIAGNOSIS — I739 Peripheral vascular disease, unspecified: Secondary | ICD-10-CM

## 2022-04-11 DIAGNOSIS — I5084 End stage heart failure: Secondary | ICD-10-CM | POA: Diagnosis present

## 2022-04-11 DIAGNOSIS — I480 Paroxysmal atrial fibrillation: Secondary | ICD-10-CM | POA: Diagnosis present

## 2022-04-11 DIAGNOSIS — Z87442 Personal history of urinary calculi: Secondary | ICD-10-CM

## 2022-04-11 DIAGNOSIS — Z8249 Family history of ischemic heart disease and other diseases of the circulatory system: Secondary | ICD-10-CM

## 2022-04-11 DIAGNOSIS — I951 Orthostatic hypotension: Secondary | ICD-10-CM | POA: Diagnosis present

## 2022-04-11 DIAGNOSIS — I998 Other disorder of circulatory system: Secondary | ICD-10-CM

## 2022-04-11 DIAGNOSIS — I130 Hypertensive heart and chronic kidney disease with heart failure and stage 1 through stage 4 chronic kidney disease, or unspecified chronic kidney disease: Secondary | ICD-10-CM | POA: Diagnosis present

## 2022-04-11 DIAGNOSIS — R7989 Other specified abnormal findings of blood chemistry: Secondary | ICD-10-CM | POA: Diagnosis present

## 2022-04-11 DIAGNOSIS — Z823 Family history of stroke: Secondary | ICD-10-CM

## 2022-04-11 LAB — CBC
HCT: 48.1 % (ref 39.0–52.0)
Hemoglobin: 15.8 g/dL (ref 13.0–17.0)
MCH: 32.5 pg (ref 26.0–34.0)
MCHC: 32.8 g/dL (ref 30.0–36.0)
MCV: 99 fL (ref 80.0–100.0)
Platelets: 181 10*3/uL (ref 150–400)
RBC: 4.86 MIL/uL (ref 4.22–5.81)
RDW: 13.2 % (ref 11.5–15.5)
WBC: 5.5 10*3/uL (ref 4.0–10.5)
nRBC: 0 % (ref 0.0–0.2)

## 2022-04-11 LAB — BASIC METABOLIC PANEL
Anion gap: 10 (ref 5–15)
BUN: 15 mg/dL (ref 8–23)
CO2: 23 mmol/L (ref 22–32)
Calcium: 8.6 mg/dL — ABNORMAL LOW (ref 8.9–10.3)
Chloride: 103 mmol/L (ref 98–111)
Creatinine, Ser: 1.45 mg/dL — ABNORMAL HIGH (ref 0.61–1.24)
GFR, Estimated: 48 mL/min — ABNORMAL LOW (ref >60)
Glucose, Bld: 119 mg/dL — ABNORMAL HIGH (ref 70–99)
Potassium: 3.6 mmol/L (ref 3.5–5.1)
Sodium: 136 mmol/L (ref 135–145)

## 2022-04-11 LAB — URINALYSIS, ROUTINE W REFLEX MICROSCOPIC
Bacteria, UA: NONE SEEN
Bilirubin Urine: NEGATIVE
Glucose, UA: >=500 mg/dL — AB
Ketones, ur: NEGATIVE mg/dL
Leukocytes,Ua: NEGATIVE
Nitrite: NEGATIVE
Protein, ur: 30 mg/dL — AB
Specific Gravity, Urine: 1.025 (ref 1.005–1.030)
pH: 5 (ref 5.0–8.0)

## 2022-04-11 LAB — TROPONIN I (HIGH SENSITIVITY)
Troponin I (High Sensitivity): 20 ng/L — ABNORMAL HIGH (ref <18)
Troponin I (High Sensitivity): 22 ng/L — ABNORMAL HIGH (ref <18)

## 2022-04-11 LAB — TSH: TSH: 2.204 u[IU]/mL (ref 0.350–4.500)

## 2022-04-11 MED ORDER — TRAMADOL HCL 50 MG PO TABS
50.0000 mg | ORAL_TABLET | Freq: Once | ORAL | Status: AC
  Administered 2022-04-11: 50 mg via ORAL
  Filled 2022-04-11: qty 1

## 2022-04-11 NOTE — Assessment & Plan Note (Signed)
Lab Results  Component Value Date   CREATININE 1.45 (H) 04/11/2022   CREATININE 1.27 06/16/2016   CREATININE 1.37 (H) 12/31/2014  2/2 prerenal etiology from hypotension.  Hold lisinopril. Hold Diltiazem. AM Team to start Back BP meds as deemed appropriate.

## 2022-04-11 NOTE — Assessment & Plan Note (Signed)
Continue xarelto

## 2022-04-11 NOTE — H&P (Signed)
History and Physical    Chief Complaint: Falls.  HISTORY OF PRESENT ILLNESS: Curtis Wagner is an 82 y.o. male  seen today for falls. Pt also has complaints of left leg numbness and weakness.Reports dizziness worse with standing that occurs with standing and then he falls. Daughter at bedside is a Marine scientist. He recently moved in and independent living facility. Extensive heart and vascular history. Also reports of myalgia. NO chest pain, No sob or palpitation.  Patient does not report any changes in vision or speech or nausea vomiting diarrhea abdominal pain.  No fevers chills no seizures.  No bleeding or melena.  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  Musculoskeletal:  Positive for myalgias.       Left leg numbness.   Neurological:  Positive for dizziness and weakness.   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/01/2015   Procedure: RIGHT TOTAL SHOULDER ARTHROPLASTY;  Surgeon: Ninetta Lights, MD;  Location: [facility];  Service: Orthopedics;  Laterality: Right;     MEDICATIONS: Current Outpatient Medications  Medication Instructions   allopurinol (ZYLOPRIM) 300 mg, Oral, Daily   digoxin (DIGOX) 0.125 mg, Oral, Daily   diltiazem (CARDIZEM CD) 180 mg, Oral, Every morning   diltiazem (DILTIAZEM CD) 120 mg, Oral, Every evening   lisinopril (ZESTRIL) 10 mg, Oral, Daily after supper   Multiple Vitamins-Minerals (CENTRUM SILVER ADULT 50+) TABS 1  tablet, Oral, Daily   rivaroxaban (XARELTO) 20 mg, Oral, Daily with supper   tamsulosin (FLOMAX) 0.4 mg, Oral, Daily at bedtime, Reported on 03/24/2015   ED Course: Pt in Ed awake oriented afebrile O2 sats within normal limits. Vitals:   04/11/22 1622 04/11/22 1831 04/11/22 2154  BP: 101/80 111/81 (!) 136/90  Pulse: 77 77 79  Resp: 18 18 18   Temp: 98.1 F (36.7 C)  98.2 F (36.8 C)  TempSrc: Oral  Oral  SpO2: 93% 98% 99%  Blood pressure is low Patient has positive orthostatics. No intake/output data recorded. SpO2: 99 % Blood work in ed shows: AKI of 1.45 with a EGFR 40 Troponin of 20 and 22. CBC without differential within normal limits. Xrays negative for any fracture. Results for orders placed or performed during the hospital encounter of 04/11/22 (from the past 24 hour(s))  Basic metabolic panel     Status: Abnormal   Collection Time: 04/11/22  4:25 PM  Result Value Ref Range   Sodium 136 135 - 145 mmol/L   Potassium 3.6 3.5 - 5.1 mmol/L   Chloride 103 98 - 111 mmol/L   CO2 23 22 - 32 mmol/L   Glucose, Bld 119 (H) 70 - 99 mg/dL   BUN 15 8 - 23 mg/dL   Creatinine, Ser 1.45 (H) 0.61 - 1.24 mg/dL   Calcium 8.6 (L) 8.9 - 10.3 mg/dL   GFR, Estimated 48 (L) >60 mL/min  Anion gap 10 5 - 15  CBC     Status: None   Collection Time: 04/11/22  4:25 PM  Result Value Ref Range   WBC 5.5 4.0 - 10.5 K/uL   RBC 4.86 4.22 - 5.81 MIL/uL   Hemoglobin 15.8 13.0 - 17.0 g/dL   HCT 48.1 39.0 - 52.0 %   MCV 99.0 80.0 - 100.0 fL   MCH 32.5 26.0 - 34.0 pg   MCHC 32.8 30.0 - 36.0 g/dL   RDW 13.2 11.5 - 15.5 %   Platelets 181 150 - 400 K/uL   nRBC 0.0 0.0 - 0.2 %  Troponin I (High Sensitivity)     Status: Abnormal   Collection Time: 04/11/22  8:48 PM  Result Value Ref Range   Troponin I (High Sensitivity) 20 (H) <18 ng/L  TSH     Status: None   Collection Time: 04/11/22  8:48 PM  Result Value Ref Range   TSH 2.204 0.350 - 4.500 uIU/mL  Urinalysis, Routine w reflex microscopic  -Urine, Clean Catch     Status: Abnormal   Collection Time: 04/11/22  9:35 PM  Result Value Ref Range   Color, Urine YELLOW (A) YELLOW   APPearance CLEAR (A) CLEAR   Specific Gravity, Urine 1.025 1.005 - 1.030   pH 5.0 5.0 - 8.0   Glucose, UA >=500 (A) NEGATIVE mg/dL   Hgb urine dipstick SMALL (A) NEGATIVE   Bilirubin Urine NEGATIVE NEGATIVE   Ketones, ur NEGATIVE NEGATIVE mg/dL   Protein, ur 30 (A) NEGATIVE mg/dL   Nitrite NEGATIVE NEGATIVE   Leukocytes,Ua NEGATIVE NEGATIVE   RBC / HPF 0-5 0 - 5 RBC/hpf   WBC, UA 0-5 0 - 5 WBC/hpf   Bacteria, UA NONE SEEN NONE SEEN   Squamous Epithelial / HPF 0-5 0 - 5 /HPF   Mucus PRESENT   Troponin I (High Sensitivity)     Status: Abnormal   Collection Time: 04/11/22  9:35 PM  Result Value Ref Range   Troponin I (High Sensitivity) 22 (H) <18 ng/L  CK     Status: None   Collection Time: 04/12/22 12:15 AM  Result Value Ref Range   Total CK 276 49 - 397 U/L  Lactic acid, plasma     Status: None   Collection Time: 04/12/22 12:15 AM  Result Value Ref Range   Lactic Acid, Venous 1.9 0.5 - 1.9 mmol/L  D-dimer, quantitative     Status: Abnormal   Collection Time: 04/12/22 12:15 AM  Result Value Ref Range   D-Dimer, Quant 12.51 (H) 0.00 - 0.50 ug/mL-FEU   Unresulted Labs (From admission, onward)     Start     Ordered   04/12/22 0500  Comprehensive metabolic panel  Tomorrow morning,   STAT        04/12/22 0128   04/12/22 0500  CBC  Tomorrow morning,   STAT        04/12/22 0128   04/12/22 0141  Comprehensive metabolic panel  ONCE - STAT,   R        04/12/22 0140   04/11/22 2323  Lactic acid, plasma  STAT Now then every 3 hours,   STAT      04/11/22 2325           Pt has received : Orders Placed This Encounter  Procedures   DG Hip Unilat W or Wo Pelvis 2-3 Views Left    Standing Status:   Standing    Number of Occurrences:  1    Order Specific Question:   Reason for Exam (SYMPTOM  OR DIAGNOSIS REQUIRED)    Answer:   Pain after  falls   DG Ankle 2 Views Left    Standing Status:   Standing    Number of Occurrences:   1    Order Specific Question:   Reason for Exam (SYMPTOM  OR DIAGNOSIS REQUIRED)    Answer:   Pain after falls   DG Foot 2 Views Left    Standing Status:   Standing    Number of Occurrences:   1    Order Specific Question:   Reason for Exam (SYMPTOM  OR DIAGNOSIS REQUIRED)    Answer:   Pain after falls   MR BRAIN WO CONTRAST    Standing Status:   Standing    Number of Occurrences:   1    Order Specific Question:   What is the patient's sedation requirement?    Answer:   No Sedation    Order Specific Question:   Does the patient have a pacemaker or implanted devices?    Answer:   No    Order Specific Question:   Radiology Contrast Protocol - do NOT remove file path    Answer:   [email or web address]   CT ANGIO AO+BIFEM W & OR WO CONTRAST    Lab Results      Component                Value               Date                      CREATININE               1.45 (H)            04/11/2022                CREATININE               1.27                06/16/2016                CREATININE               1.37 (H)            12/31/2014           This is acute kidney injury we are giving him bolus of 500 cc NS. Repeat creatinine and then proceed with scan. For any questions beyond 2 am today reach out to [employer].    Standing Status:   Standing    Number of Occurrences:   1    Order Specific Question:   Does the patient have a contrast media/X-ray dye allergy?    Answer:   No    Order Specific Question:   If indicated for the ordered procedure, I authorize the administration of contrast media per Radiology protocol    Answer:   Yes   Basic metabolic panel    Standing Status:   Standing    Number of Occurrences:   1   CBC    Standing Status:   Standing    Number of Occurrences:   1   Urinalysis, Routine w reflex microscopic -Urine, Clean Catch    Standing Status:    Standing    Number of Occurrences:   1    Order  Specific Question:   Specimen Source    Answer:   Urine, Clean Catch [76]   TSH    Standing Status:   Standing    Number of Occurrences:   1   CK    Standing Status:   Standing    Number of Occurrences:   1   Lactic acid, plasma    Standing Status:   Standing    Number of Occurrences:   2   D-dimer, quantitative    Standing Status:   Standing    Number of Occurrences:   1   Comprehensive metabolic panel    Standing Status:   Standing    Number of Occurrences:   1   CBC    Standing Status:   Standing    Number of Occurrences:   1   Comprehensive metabolic panel    Standing Status:   Standing    Number of Occurrences:   1   Diet Heart Room service appropriate? Yes; Fluid consistency: Thin    Standing Status:   Standing    Number of Occurrences:   1    Order Specific Question:   Room service appropriate?    Answer:   Yes    Order Specific Question:   Fluid consistency:    Answer:   Thin   Document Height and Actual Weight    Use scales to weigh patient, not stated or estimated weight.    Standing Status:   Standing    Number of Occurrences:   1   Maintain IV access    Standing Status:   Standing    Number of Occurrences:   1   Vital signs    Standing Status:   Standing    Number of Occurrences:   1   Notify physician (specify)    Standing Status:   Standing    Number of Occurrences:   20    Order Specific Question:   Notify Physician    Answer:   for pulse less than 55 or greater than 120    Order Specific Question:   Notify Physician    Answer:   for respiratory rate less than 12 or greater than 25    Order Specific Question:   Notify Physician    Answer:   for temperature greater than 100.5 F    Order Specific Question:   Notify Physician    Answer:   for urinary output less than 30 mL/hr for four hours    Order Specific Question:   Notify Physician    Answer:   for systolic BP less than 90 or greater than 0000000,  diastolic BP less than 60 or greater than 100    Order Specific Question:   Notify Physician    Answer:   for new hypoxia w/ oxygen saturations < 88%   Daily weights    Standing Status:   Standing    Number of Occurrences:   1   Intake and Output    Standing Status:   Standing    Number of Occurrences:   1   Initiate Oral Care Protocol    Standing Status:   Standing    Number of Occurrences:   1   Initiate Carrier Fluid Protocol    Standing Status:   Standing    Number of Occurrences:   1   RN may order General Admission PRN Orders utilizing "General Admission PRN medications" (through manage orders) for the following patient needs: allergy symptoms (  Claritin), cold sores (Carmex), cough (Robitussin DM), eye irritation (Liquifilm Tears), hemorrhoids (Tucks), indigestion (Maalox), minor skin irritation (Hydrocortisone Cream), muscle pain Suezanne Jacquet Gay), nose irritation (saline nasal spray) and sore throat (Chloraseptic spray).    Standing Status:   Standing    Number of Occurrences:   [id number]   Cardiac Monitoring Continuous x 48 hours Indications for use: Other; Other indications for use: Falls VS syncope.    Standing Status:   Standing    Number of Occurrences:   1    Order Specific Question:   Indications for use:    Answer:   Other    Order Specific Question:   Other indications for use:    Answer:   Falls VS syncope.   Bed rest    Standing Status:   Standing    Number of Occurrences:   1   Full code    Standing Status:   Standing    Number of Occurrences:   1    Order Specific Question:   By:    Answer:   Other   Consult to hospitalist  [phone]    Standing Status:   Standing    Number of Occurrences:   1    Order Specific Question:   Place call to:    Answer:   Hospitalist    Order Specific Question:   Reason for Consult    Answer:   Admit    Order Specific Question:   Diagnosis/Clinical Info for Consult:    Answer:   Recurrent falls, orthostatic dizziness   Pulse oximetry  check with vital signs    Standing Status:   Standing    Number of Occurrences:   1   Oxygen therapy Mode or (Route): Nasal cannula; Liters Per Minute: 2; Keep 02 saturation: greater than 92 %    Standing Status:   Standing    Number of Occurrences:   20    Order Specific Question:   Mode or (Route)    Answer:   Nasal cannula    Order Specific Question:   Liters Per Minute    Answer:   2    Order Specific Question:   Keep 02 saturation    Answer:   greater than 92 %   CBG monitoring, ED    Standing Status:   Standing    Number of Occurrences:   1   ED EKG    Altered mental status    Standing Status:   Standing    Number of Occurrences:   1    Order Specific Question:   Reason for Exam    Answer:   Other (See Comments)   EKG 12-Lead    Standing Status:   Standing    Number of Occurrences:   1   Admit to Inpatient (patient's expected length of stay will be greater than 2 midnights or inpatient only procedure)    Standing Status:   Standing    Number of Occurrences:   1    Order Specific Question:   Hospital Area    Answer:   [address] [[address]]    Order Specific Question:   Level of Care    Answer:   Telemetry Medical [104]    Order Specific Question:   Covid Evaluation    Answer:   Asymptomatic - no recent exposure (last 10 days) testing not required    Order Specific Question:   Diagnosis    Answer:  Falls [phone]    Order Specific Question:   Admitting Physician    Answer:   Cherylann Ratel    Order Specific Question:   Attending Physician    Answer:   Cherylann Ratel    Order Specific Question:   Certification:    Answer:   I certify this patient will need inpatient services for at least 2 midnights    Order Specific Question:   Estimated Length of Stay    Answer:   2   Aspiration precautions    Standing Status:   Standing    Number of Occurrences:   1   Fall precautions    Standing Status:   Standing    Number of Occurrences:    1    Meds ordered this encounter  Medications   traMADol (ULTRAM) tablet 50 mg   digoxin (LANOXIN) tablet 0.125 mg   DISCONTD: rivaroxaban (XARELTO) tablet 20 mg   sodium chloride flush (NS) 0.9 % injection 3 mL   0.9 %  sodium chloride infusion   OR Linked Order Group    acetaminophen (TYLENOL) tablet 650 mg    acetaminophen (TYLENOL) suppository 650 mg   Admission Imaging : DG Ankle 2 Views Left Result Date: 04/11/2022 CLINICAL DATA:  Multiple recent falls. EXAM: LEFT ANKLE - 2 VIEW COMPARISON:  None Available. FINDINGS: There is no evidence of an acute fracture or dislocation. A chronic deformity is seen involving the medial aspect of the left navicular bone, with chronic changes also noted along the left lateral malleolus and left medial malleolus. Mild degenerative changes are seen involving the left ankle, with moderate severity degenerative changes noted along the dorsal aspect of the proximal to mid left foot. Soft tissues are unremarkable. IMPRESSION: 1. No acute osseous abnormality. 2. Chronic and degenerative changes, as described above. Electronically Signed   By: Virgina Norfolk M.D.   On: 04/11/2022 20:15   DG Foot 2 Views Left Result Date: 04/11/2022 CLINICAL DATA:  Pain after falls EXAM: LEFT FOOT - 2 VIEW COMPARISON:  None Available. FINDINGS: Calcaneal spurs. Mild dorsal spurring in the midfoot. No fracture or dislocation. Normal alignment. Normal mineralization. No radiodense foreign body. No subcutaneous gas. IMPRESSION: Degenerative changes as above. No acute findings. Electronically Signed   By: Lucrezia Europe M.D.   On: 04/11/2022 20:07   DG Hip Unilat W or Wo Pelvis 2-3 Views Left Result Date: 04/11/2022 CLINICAL DATA:  Fall, pain. EXAM: DG HIP (WITH OR WITHOUT PELVIS) 2-3V LEFT COMPARISON:  None Available. FINDINGS: There is no evidence of hip fracture or dislocation. There is no evidence of arthropathy or other focal bone abnormality. Vascular calcifications. IMPRESSION:  No acute bony abnormality. Electronically Signed   By: Rolm Baptise M.D.   On: 04/11/2022 20:06  Physical Examination: Vitals:   04/11/22 1622 04/11/22 1831 04/11/22 2154  BP: 101/80 111/81 (!) 136/90  Pulse: 77 77 79  Temp: 98.1 F (36.7 C)  98.2 F (36.8 C)  Resp: 18 18 18   SpO2: 93% 98% 99%  TempSrc: Oral  Oral   Physical Exam Vitals and nursing note reviewed.  Constitutional:      General: He is not in acute distress.    Appearance: Normal appearance. He is not ill-appearing, toxic-appearing or diaphoretic.  HENT:     Head: Normocephalic and atraumatic.     Right Ear: Hearing and external ear normal.     Left Ear: Hearing and external ear normal.     Nose:  Nose normal. No nasal deformity.     Mouth/Throat:     Lips: Pink.     Mouth: Mucous membranes are moist.     Tongue: No lesions.     Pharynx: Oropharynx is clear.  Eyes:     Extraocular Movements: Extraocular movements intact.  Cardiovascular:     Rate and Rhythm: Normal rate and regular rhythm.     Pulses:          Dorsalis pedis pulses are 0 on the left side.       Posterior tibial pulses are 0 on the left side.     Heart sounds: Normal heart sounds.  Pulmonary:     Effort: Pulmonary effort is normal.     Breath sounds: Normal breath sounds.  Abdominal:     General: Bowel sounds are normal. There is no distension.     Palpations: Abdomen is soft. There is no mass.     Tenderness: There is no abdominal tenderness. There is no guarding.     Hernia: No hernia is present.  Musculoskeletal:     Right lower leg: No edema.     Left lower leg: No edema.  Skin:    General: Skin is warm.  Neurological:     General: No focal deficit present.     Mental Status: He is alert and oriented to person, place, and time.     Cranial Nerves: Cranial nerves 2-12 are intact.     Motor: Motor function is intact.  Psychiatric:        Attention and Perception: Attention normal.        Mood and Affect: Mood normal.         Speech: Speech normal.        Behavior: Behavior normal. Behavior is cooperative.        Cognition and Memory: Cognition normal.     Assessment and Plan: * Falls, initial encounter Fall precautions. 2/2 orthostatic hypotension.   Orthostatic hypotension Vitals:   04/11/22 1622 04/11/22 1831 04/11/22 2154  BP: 101/80 111/81 (!) 136/90  Pt noted to have positive orthostatic.     Absent pedal pulses Pt has left leg pain.  Absent pedal pulse / cool skin from knee downwards. Weak popliteal pulse. 2/2 to limb ischemia. We will give him bolus 500 c ns - recheck creatinine and obtain aorta runoff for LE.  Vascular consult per AM team, based on CTA LE. Pt's xarelto discontinued  in event pt needs heparin gtt.  If CTA is normal we will cont xarelto.    Myalgia 2/2 to fall. Will get CPK.  Type 2 diabetes mellitus with stage 3b chronic kidney disease, without long-term current use of insulin ([place]) Will start patient on glycemic protocol. No diabetes medications for treatment present in chart.   Bilateral pulmonary embolism (HCC) Continue xarelto.    AKI (acute kidney injury) [facility]) Lab Results  Component Value Date   CREATININE 1.45 (H) 04/11/2022   CREATININE 1.27 06/16/2016   CREATININE 1.37 (H) 12/31/2014  2/2 prerenal etiology from hypotension.  Hold lisinopril. Hold Diltiazem. AM Team to start Back BP meds as deemed appropriate.    Hypertension Vitals:   04/11/22 1622 04/11/22 1831 04/11/22 2154  BP: 101/80 111/81 (!) 136/90  This patient is orthostatic currently we will hold patient's diltiazem both doses and lisinopril. Start patient on low rate MIVF, NS at 20 cc /hour.    PAF (paroxysmal atrial fibrillation) ([place]) Patient is currently on Xarelto 20 mg daily  in addition to digoxin and diltiazem 2 different doses 120 mg every morning 180 mg every evening. EKG today shows A.fib at 83 and prolonged qtc.  Image yet to be scanned.     DVT prophylaxis:   Xarelto.  Code Status:  Full Code.  Family Communication:  [name],Gagnon. Disposition Plan:  TBD. Consults called:  None.  Admission status: Inpatient.  Unit/ Expected LOS: Med-Tele/ 2 los.   Para Skeans MD [facility]  6 PM- 2 AM. Please contact me via secure Chat 6 PM-2 AM. [phone]( Pager ) To contact the [facility] Attending or Consulting provider [place] or covering provider during after hours [place], for this patient.   Check the care team in [facility] and look for a) attending/consulting [facility] provider listed and b) the [facility] team listed Log into [email or web address] and use [place]'s universal password to access. If you do not have the password, please contact the hospital operator. Locate the [facility] provider you are looking for under [facility] and page to a number that you can be directly reached. If you still have difficulty reaching the provider, please page the [facility] (Director on Call) for the Hospitalists listed on amion for assistance. [email or web address] 04/12/2022, 1:55 AM

## 2022-04-11 NOTE — ED Triage Notes (Signed)
Patient to ED via POV for multiple falls. Patient just recently moved into independent living. Not taking meds as he should. Extensive heart history. Complaining of dizziness when standing. States he hurts all over when walking.

## 2022-04-11 NOTE — ED Notes (Signed)
Pt complaining of left leg numbness, weakness. States that lately when standing he gets lightheaded which contributes to falls. Daughter states at least 9 falls in the last month.

## 2022-04-11 NOTE — Assessment & Plan Note (Signed)
Patient is currently on Xarelto 20 mg daily in addition to digoxin and diltiazem 2 different doses 120 mg every morning 180 mg every evening. EKG today shows A.fib at 83 and prolonged qtc.  Image yet to be scanned.

## 2022-04-11 NOTE — ED Triage Notes (Signed)
First nurse note: Newborn brought patient over. C/o falls X3 weeks. Left leg numbness. Reports hitting head multiple times. Denies LOC. Has not been seen anywhere for falls.   [place] Vitals: 103/71 b/p 81pulse  History arthritis

## 2022-04-11 NOTE — Assessment & Plan Note (Signed)
Vitals:   04/11/22 1622 04/11/22 1831 04/11/22 2154  BP: 101/80 111/81 (!) 136/90  This patient is orthostatic currently we will hold patient's diltiazem both doses and lisinopril. Start patient on low rate MIVF, NS at 20 cc /hour.

## 2022-04-11 NOTE — ED Provider Notes (Signed)
[facility] Provider Note    Event Date/Time   First MD Initiated Contact with Patient 04/11/22 1823     (approximate)   History   Fall (/)   HPI  Curtis Wagner is a 82 y.o. male with a history of paroxysmal atrial fibrillation, DVT, arthritis, hypertension, and hyperlipidemia who presents with recurrent falls as well as some dizziness and left leg pain.  Per the patient and his daughter, the patient recently moved from a private home to an independent living facility.  Over the last few weeks he has complained of pain in his left leg especially down towards ankle, and has had trouble walking.  Frequently when he stands up he feels very lightheaded.  He has therefore not been getting up much except to go to the bathroom or to eat.  He has fallen multiple times.  He reports falling onto his left hip.  He states he is also hit his head a few times.  The patient denies any vomiting or diarrhea, chest pain, difficulty breathing, fever or chills, or other acute symptoms.  I reviewed the past medical records.  The patient was most recently admitted in 2016 for shoulder arthroplasty.  His most recent outpatient encounter was at [facility] in [place], [place] with family medicine for follow-up after an ED visit therefore abdominal pain, vomiting, and diarrhea.  He was diagnosed with acute viral gastroenteritis at that time.   Physical Exam   Triage Vital Signs: ED Triage Vitals  Enc Vitals Group     BP 04/11/22 1622 101/80     Pulse Rate 04/11/22 1622 77     Resp 04/11/22 1622 18     Temp 04/11/22 1622 98.1 F (36.7 C)     Temp Source 04/11/22 1622 Oral     SpO2 04/11/22 1622 93 %     Weight --      Height --      Head Circumference --      Peak Flow --      Pain Score 04/11/22 1621 7     Pain Loc --      Pain Edu? --      Excl. in [place]? --     Most recent vital signs: Vitals:   04/11/22 1831 04/11/22 2154  BP: 111/81 (!) 136/90  Pulse: 77 79  Resp: 18 18   Temp:  98.2 F (36.8 C)  SpO2: 98% 99%     General: Awake, no distress.  CV:  Good peripheral perfusion.  Normal heart sounds. Resp:  Normal effort.  Lungs CTAB. Abd:  No distention.  Other:  EOMI.  PERRLA.  No facial droop.  Motor and sensory intact in all extremities.  Pain on range of motion of left ankle.  No deformity.  Minimal bilateral lower extremity edema.   ED Results / Procedures / Treatments   Labs (all labs ordered are listed, but only abnormal results are displayed) Labs Reviewed  BASIC METABOLIC PANEL - Abnormal; Notable for the following components:      Result Value   Glucose, Bld 119 (*)    Creatinine, Ser 1.45 (*)    Calcium 8.6 (*)    GFR, Estimated 48 (*)    All other components within normal limits  URINALYSIS, ROUTINE W REFLEX MICROSCOPIC - Abnormal; Notable for the following components:   Color, Urine YELLOW (*)    APPearance CLEAR (*)    Glucose, UA >=500 (*)    Hgb urine dipstick SMALL (*)  Protein, ur 30 (*)    All other components within normal limits  TROPONIN I (HIGH SENSITIVITY) - Abnormal; Notable for the following components:   Troponin I (High Sensitivity) 20 (*)    All other components within normal limits  TROPONIN I (HIGH SENSITIVITY) - Abnormal; Notable for the following components:   Troponin I (High Sensitivity) 22 (*)    All other components within normal limits  CBC  TSH  CK  LACTIC ACID, PLASMA  LACTIC ACID, PLASMA  D-DIMER, QUANTITATIVE  CBG MONITORING, ED     EKG  ED ECG REPORT I, Arta Silence, the attending physician, personally viewed and interpreted this ECG.  Date: 04/11/2022 EKG Time: 1624 Rate: 83 Rhythm: atrial fibrillation QRS Axis: normal Intervals: Prolonged QTc ST/T Wave abnormalities: normal Narrative Interpretation: no evidence of acute ischemia    RADIOLOGY  XR L hip: I independently viewed and interpreted the images; there is no acute fracture XR L ankle: No acute fracture XR L  foot: No acute fracture  PROCEDURES:  Critical Care performed: No  Procedures   MEDICATIONS ORDERED IN ED: Medications  traMADol (ULTRAM) tablet 50 mg (50 mg Oral Given 04/11/22 2304)     IMPRESSION / MDM / ASSESSMENT AND PLAN / ED COURSE  I reviewed the triage vital signs and the nursing notes.  82 year old male with PMH as noted above presents with frequent recurrent falls, acute on chronic left leg pain, and dizziness when standing for the last several weeks.  Physical exam is unremarkable for acute findings.  Differential diagnosis includes, but is not limited to:  Leg pain: Stress fracture or other trauma, sciatica, exacerbation of arthritis.  Dizziness/falls: Dehydration, electrolyte abnormality, UTI, cardiac cause, NPH or other CNS etiology.  There are no findings to suggest intracranial hemorrhage or acute stroke.  Will obtain lab workup to evaluate for these etiologies, x-rays of the left lower extremity, and reassess.  Patient's presentation is most consistent with acute presentation with potential threat to life or bodily function.  The patient is on the cardiac monitor to evaluate for evidence of arrhythmia and/or significant heart rate changes.  ----------------------------------------- 11:55 PM on 04/11/2022 -----------------------------------------  ED workup is negative for concerning acute findings.  X-rays are negative for trauma.  Electrolytes are normal.  There is no leukocytosis or anemia.  Urinalysis is not consistent with a UTI.  Troponins are minimally elevated but not consistent with ACS.  On orthostatic vital signs, the patient's actual vitals did not change significantly but he did become quite dizzy when standing and had difficulty ambulating.  I had an extensive discussion with the patient and his daughter about the results of the workup and plan of care.  Based on discussion with them, the patient is not safe to go home in his current condition.  I  recommended admission for further workup including an MRI of the brain, potential additional labs if needed, rehab evaluation, and social work evaluation for possible home health or SNF placement.  The patient and daughter are agreeable with this plan.  I then consulted Dr. Posey Pronto from the hospitalist service; based on her discussion she agreed to admit the patient.   FINAL CLINICAL IMPRESSION(S) / ED DIAGNOSES   Final diagnoses:  Fall, initial encounter  Left leg pain  Orthostatic dizziness     Rx / DC Orders   ED Discharge Orders     None        Note:  This document was prepared using Dragon voice recognition software  and may include unintentional dictation errors.    Arta Silence, MD 04/11/22 2356

## 2022-04-11 NOTE — Assessment & Plan Note (Signed)
Will start patient on glycemic protocol. No diabetes medications for treatment present in chart.

## 2022-04-12 ENCOUNTER — Inpatient Hospital Stay (HOSPITAL_COMMUNITY)
Admit: 2022-04-12 | Discharge: 2022-04-12 | Disposition: A | Discharge status: Home / Self Care | Payer: Medicare PPO | Attending: Internal Medicine | Admitting: Internal Medicine

## 2022-04-12 ENCOUNTER — Inpatient Hospital Stay: Payer: Medicare PPO

## 2022-04-12 ENCOUNTER — Encounter
Admission: EM | Disposition: A | Discharge status: Hospice | Payer: Self-pay | Source: Home / Self Care | Attending: Internal Medicine

## 2022-04-12 DIAGNOSIS — I420 Dilated cardiomyopathy: Secondary | ICD-10-CM | POA: Diagnosis present

## 2022-04-12 DIAGNOSIS — N1832 Chronic kidney disease, stage 3b: Secondary | ICD-10-CM | POA: Diagnosis present

## 2022-04-12 DIAGNOSIS — E872 Acidosis, unspecified: Secondary | ICD-10-CM | POA: Diagnosis not present

## 2022-04-12 DIAGNOSIS — I70222 Atherosclerosis of native arteries of extremities with rest pain, left leg: Secondary | ICD-10-CM | POA: Diagnosis present

## 2022-04-12 DIAGNOSIS — I2510 Atherosclerotic heart disease of native coronary artery without angina pectoris: Secondary | ICD-10-CM

## 2022-04-12 DIAGNOSIS — I951 Orthostatic hypotension: Secondary | ICD-10-CM | POA: Diagnosis present

## 2022-04-12 DIAGNOSIS — I5084 End stage heart failure: Secondary | ICD-10-CM | POA: Diagnosis present

## 2022-04-12 DIAGNOSIS — Z7901 Long term (current) use of anticoagulants: Secondary | ICD-10-CM | POA: Diagnosis not present

## 2022-04-12 DIAGNOSIS — E876 Hypokalemia: Secondary | ICD-10-CM | POA: Diagnosis present

## 2022-04-12 DIAGNOSIS — I739 Peripheral vascular disease, unspecified: Secondary | ICD-10-CM

## 2022-04-12 DIAGNOSIS — E1122 Type 2 diabetes mellitus with diabetic chronic kidney disease: Secondary | ICD-10-CM | POA: Diagnosis present

## 2022-04-12 DIAGNOSIS — N1831 Chronic kidney disease, stage 3a: Secondary | ICD-10-CM | POA: Diagnosis not present

## 2022-04-12 DIAGNOSIS — I998 Other disorder of circulatory system: Secondary | ICD-10-CM

## 2022-04-12 DIAGNOSIS — Z515 Encounter for palliative care: Secondary | ICD-10-CM | POA: Diagnosis not present

## 2022-04-12 DIAGNOSIS — E871 Hypo-osmolality and hyponatremia: Secondary | ICD-10-CM | POA: Diagnosis not present

## 2022-04-12 DIAGNOSIS — I513 Intracardiac thrombosis, not elsewhere classified: Secondary | ICD-10-CM | POA: Diagnosis present

## 2022-04-12 DIAGNOSIS — Z95828 Presence of other vascular implants and grafts: Secondary | ICD-10-CM | POA: Diagnosis not present

## 2022-04-12 DIAGNOSIS — I5020 Unspecified systolic (congestive) heart failure: Secondary | ICD-10-CM

## 2022-04-12 DIAGNOSIS — R42 Dizziness and giddiness: Secondary | ICD-10-CM | POA: Diagnosis not present

## 2022-04-12 DIAGNOSIS — I743 Embolism and thrombosis of arteries of the lower extremities: Secondary | ICD-10-CM | POA: Diagnosis present

## 2022-04-12 DIAGNOSIS — I480 Paroxysmal atrial fibrillation: Secondary | ICD-10-CM | POA: Diagnosis not present

## 2022-04-12 DIAGNOSIS — I472 Ventricular tachycardia, unspecified: Secondary | ICD-10-CM | POA: Diagnosis present

## 2022-04-12 DIAGNOSIS — M79605 Pain in left leg: Secondary | ICD-10-CM | POA: Diagnosis present

## 2022-04-12 DIAGNOSIS — R296 Repeated falls: Secondary | ICD-10-CM | POA: Diagnosis present

## 2022-04-12 DIAGNOSIS — R0989 Other specified symptoms and signs involving the circulatory and respiratory systems: Secondary | ICD-10-CM | POA: Diagnosis present

## 2022-04-12 DIAGNOSIS — I4819 Other persistent atrial fibrillation: Secondary | ICD-10-CM

## 2022-04-12 DIAGNOSIS — I25118 Atherosclerotic heart disease of native coronary artery with other forms of angina pectoris: Secondary | ICD-10-CM | POA: Diagnosis not present

## 2022-04-12 DIAGNOSIS — I272 Pulmonary hypertension, unspecified: Secondary | ICD-10-CM | POA: Diagnosis present

## 2022-04-12 DIAGNOSIS — W19XXXA Unspecified fall, initial encounter: Secondary | ICD-10-CM | POA: Diagnosis not present

## 2022-04-12 DIAGNOSIS — E785 Hyperlipidemia, unspecified: Secondary | ICD-10-CM | POA: Diagnosis present

## 2022-04-12 DIAGNOSIS — E440 Moderate protein-calorie malnutrition: Secondary | ICD-10-CM | POA: Diagnosis present

## 2022-04-12 DIAGNOSIS — I2699 Other pulmonary embolism without acute cor pulmonale: Secondary | ICD-10-CM | POA: Diagnosis not present

## 2022-04-12 DIAGNOSIS — Z87891 Personal history of nicotine dependence: Secondary | ICD-10-CM

## 2022-04-12 DIAGNOSIS — E1151 Type 2 diabetes mellitus with diabetic peripheral angiopathy without gangrene: Secondary | ICD-10-CM | POA: Diagnosis present

## 2022-04-12 DIAGNOSIS — F0390 Unspecified dementia without behavioral disturbance: Secondary | ICD-10-CM | POA: Diagnosis present

## 2022-04-12 DIAGNOSIS — Z66 Do not resuscitate: Secondary | ICD-10-CM | POA: Diagnosis not present

## 2022-04-12 DIAGNOSIS — D696 Thrombocytopenia, unspecified: Secondary | ICD-10-CM | POA: Diagnosis not present

## 2022-04-12 DIAGNOSIS — Z7189 Other specified counseling: Secondary | ICD-10-CM | POA: Diagnosis not present

## 2022-04-12 DIAGNOSIS — M791 Myalgia, unspecified site: Secondary | ICD-10-CM | POA: Diagnosis present

## 2022-04-12 DIAGNOSIS — I4821 Permanent atrial fibrillation: Secondary | ICD-10-CM | POA: Diagnosis present

## 2022-04-12 DIAGNOSIS — I130 Hypertensive heart and chronic kidney disease with heart failure and stage 1 through stage 4 chronic kidney disease, or unspecified chronic kidney disease: Secondary | ICD-10-CM | POA: Diagnosis present

## 2022-04-12 DIAGNOSIS — I5022 Chronic systolic (congestive) heart failure: Secondary | ICD-10-CM | POA: Diagnosis present

## 2022-04-12 HISTORY — PX: LOWER EXTREMITY ANGIOGRAPHY: CATH118251

## 2022-04-12 LAB — CBC
HCT: 40.9 % (ref 39.0–52.0)
HCT: 42.8 % (ref 39.0–52.0)
Hemoglobin: 13.7 g/dL (ref 13.0–17.0)
Hemoglobin: 14.5 g/dL (ref 13.0–17.0)
MCH: 32.8 pg (ref 26.0–34.0)
MCH: 33.4 pg (ref 26.0–34.0)
MCHC: 33.5 g/dL (ref 30.0–36.0)
MCHC: 33.9 g/dL (ref 30.0–36.0)
MCV: 97.8 fL (ref 80.0–100.0)
MCV: 98.6 fL (ref 80.0–100.0)
Platelets: 135 10*3/uL — ABNORMAL LOW (ref 150–400)
Platelets: 132 10*3/uL — ABNORMAL LOW (ref 150–400)
RBC: 4.18 MIL/uL — ABNORMAL LOW (ref 4.22–5.81)
RBC: 4.34 MIL/uL (ref 4.22–5.81)
RDW: 13.2 % (ref 11.5–15.5)
RDW: 13.2 % (ref 11.5–15.5)
WBC: 4.6 10*3/uL (ref 4.0–10.5)
WBC: 5.3 10*3/uL (ref 4.0–10.5)
nRBC: 0 % (ref 0.0–0.2)
nRBC: 0 % (ref 0.0–0.2)

## 2022-04-12 LAB — ECHOCARDIOGRAM COMPLETE
AR max vel: 2.36 cm2
AV Area VTI: 2.49 cm2
AV Area mean vel: 2.27 cm2
AV Mean grad: 2.3 mmHg
AV Peak grad: 4.1 mmHg
Ao pk vel: 1.02 m/s
Area-P 1/2: 4.46 cm2
Calc EF: 24.2 %
Est EF: <20
Height: 71 in
MV VTI: 1.76 cm2
P 1/2 time: 552 msec
S' Lateral: 5.5 cm
Single Plane A2C EF: 26.5 %
Single Plane A4C EF: 18.9 %
Weight: 2787.2 oz

## 2022-04-12 LAB — COMPREHENSIVE METABOLIC PANEL
ALT: 17 U/L (ref 0–44)
AST: 32 U/L (ref 15–41)
Albumin: 2.9 g/dL — ABNORMAL LOW (ref 3.5–5.0)
Alkaline Phosphatase: 105 U/L (ref 38–126)
Anion gap: 9 (ref 5–15)
BUN: 16 mg/dL (ref 8–23)
CO2: 22 mmol/L (ref 22–32)
Calcium: 7.9 mg/dL — ABNORMAL LOW (ref 8.9–10.3)
Chloride: 104 mmol/L (ref 98–111)
Creatinine, Ser: 1.34 mg/dL — ABNORMAL HIGH (ref 0.61–1.24)
GFR, Estimated: 53 mL/min — ABNORMAL LOW (ref >60)
Glucose, Bld: 105 mg/dL — ABNORMAL HIGH (ref 70–99)
Potassium: 3.4 mmol/L — ABNORMAL LOW (ref 3.5–5.1)
Sodium: 135 mmol/L (ref 135–145)
Total Bilirubin: 1.9 mg/dL — ABNORMAL HIGH (ref 0.3–1.2)
Total Protein: 5.9 g/dL — ABNORMAL LOW (ref 6.5–8.1)

## 2022-04-12 LAB — LACTIC ACID, PLASMA
Lactic Acid, Venous: 1.2 mmol/L (ref 0.5–1.9)
Lactic Acid, Venous: 1.2 mmol/L (ref 0.5–1.9)
Lactic Acid, Venous: 1.9 mmol/L (ref 0.5–1.9)
Lactic Acid, Venous: 2 mmol/L (ref 0.5–1.9)

## 2022-04-12 LAB — PROTIME-INR
INR: 1.4 — ABNORMAL HIGH (ref 0.8–1.2)
Prothrombin Time: 16.6 seconds — ABNORMAL HIGH (ref 11.4–15.2)

## 2022-04-12 LAB — SURGICAL PCR SCREEN
MRSA, PCR: NEGATIVE
Staphylococcus aureus: NEGATIVE

## 2022-04-12 LAB — HEPARIN LEVEL (UNFRACTIONATED)
Heparin Unfractionated: <0.1 IU/mL — ABNORMAL LOW (ref 0.30–0.70)
Heparin Unfractionated: 0.57 IU/mL (ref 0.30–0.70)

## 2022-04-12 LAB — CK: Total CK: 276 U/L (ref 49–397)

## 2022-04-12 LAB — D-DIMER, QUANTITATIVE: D-Dimer, Quant: 12.51 ug/mL-FEU — ABNORMAL HIGH (ref 0.00–0.50)

## 2022-04-12 LAB — FIBRINOGEN: Fibrinogen: 217 mg/dL (ref 210–475)

## 2022-04-12 LAB — APTT: aPTT: 36 seconds (ref 24–36)

## 2022-04-12 LAB — GLUCOSE, CAPILLARY: Glucose-Capillary: 87 mg/dL (ref 70–99)

## 2022-04-12 SURGERY — LOWER EXTREMITY ANGIOGRAPHY
Anesthesia: Moderate Sedation | Laterality: Left

## 2022-04-12 MED ORDER — PERFLUTREN LIPID MICROSPHERE
1.0000 mL | INTRAVENOUS | Status: AC | PRN
  Administered 2022-04-12: 5 mL via INTRAVENOUS

## 2022-04-12 MED ORDER — SODIUM CHLORIDE 0.9 % IV SOLN: 250.0000 mL | INTRAVENOUS | Status: DC | PRN

## 2022-04-12 MED ORDER — CEFAZOLIN SODIUM-DEXTROSE 2-4 GM/100ML-% IV SOLN: 2.0000 g | INTRAVENOUS | Status: AC

## 2022-04-12 MED ORDER — ONDANSETRON HCL 4 MG/2ML IJ SOLN: 4.0000 mg | Freq: Four times a day (QID) | INTRAMUSCULAR | Status: DC | PRN

## 2022-04-12 MED ORDER — RIVAROXABAN 20 MG PO TABS: 20.0000 mg | ORAL_TABLET | Freq: Every day | ORAL | Status: DC

## 2022-04-12 MED ORDER — IOHEXOL 350 MG/ML SOLN
125.0000 mL | Freq: Once | INTRAVENOUS | Status: AC | PRN
  Administered 2022-04-12: 125 mL via INTRAVENOUS

## 2022-04-12 MED ORDER — ALTEPLASE 2 MG IJ SOLR
INTRAMUSCULAR | Status: DC | PRN
  Administered 2022-04-12: 8 mg

## 2022-04-12 MED ORDER — HEPARIN (PORCINE) 25000 UT/250ML-% IV SOLN
1250.0000 [IU]/h | INTRAVENOUS | Status: DC
  Administered 2022-04-12: 1250 [IU]/h via INTRAVENOUS
  Filled 2022-04-12: qty 250

## 2022-04-12 MED ORDER — SODIUM CHLORIDE 0.9 % IV SOLN: INTRAVENOUS | Status: AC

## 2022-04-12 MED ORDER — SODIUM CHLORIDE 0.9% FLUSH: 3.0000 mL | INTRAVENOUS | Status: DC | PRN

## 2022-04-12 MED ORDER — SODIUM CHLORIDE 0.9% FLUSH
3.0000 mL | Freq: Two times a day (BID) | INTRAVENOUS | Status: DC
  Administered 2022-04-12 – 2022-04-17 (×11): 3 mL via INTRAVENOUS

## 2022-04-12 MED ORDER — HEPARIN SODIUM (PORCINE) 1000 UNIT/ML IJ SOLN
INTRAMUSCULAR | Status: DC | PRN
  Administered 2022-04-12: 4000 [IU] via INTRAVENOUS

## 2022-04-12 MED ORDER — HEPARIN (PORCINE) 25000 UT/250ML-% IV SOLN
INTRAVENOUS | Status: AC
  Filled 2022-04-12: qty 250

## 2022-04-12 MED ORDER — CHLORHEXIDINE GLUCONATE CLOTH 2 % EX PADS: 6.0000 | MEDICATED_PAD | Freq: Once | CUTANEOUS | Status: DC

## 2022-04-12 MED ORDER — SODIUM CHLORIDE 0.9 % IV SOLN
1.0000 mg/h | INTRAVENOUS | Status: AC
  Administered 2022-04-12: 1 mg/h
  Filled 2022-04-12: qty 10

## 2022-04-12 MED ORDER — ACETAMINOPHEN 325 MG PO TABS
650.0000 mg | ORAL_TABLET | Freq: Four times a day (QID) | ORAL | Status: DC | PRN
  Administered 2022-04-12 – 2022-04-14 (×2): 650 mg via ORAL
  Filled 2022-04-12 (×2): qty 2

## 2022-04-12 MED ORDER — CHLORHEXIDINE GLUCONATE CLOTH 2 % EX PADS
6.0000 | MEDICATED_PAD | Freq: Every day | CUTANEOUS | Status: DC
  Administered 2022-04-12 – 2022-04-16 (×4): 6 via TOPICAL

## 2022-04-12 MED ORDER — MIDAZOLAM HCL 2 MG/ML PO SYRP: 8.0000 mg | ORAL_SOLUTION | Freq: Once | ORAL | Status: DC | PRN

## 2022-04-12 MED ORDER — FENTANYL CITRATE PF 50 MCG/ML IJ SOSY
12.5000 ug | PREFILLED_SYRINGE | Freq: Once | INTRAMUSCULAR | Status: AC | PRN
  Administered 2022-04-12: 12.5 ug via INTRAVENOUS
  Filled 2022-04-12: qty 1

## 2022-04-12 MED ORDER — ASPIRIN 325 MG PO TABS
325.0000 mg | ORAL_TABLET | Freq: Every day | ORAL | Status: DC
  Administered 2022-04-12 – 2022-04-13 (×2): 325 mg via ORAL
  Filled 2022-04-12 (×2): qty 1

## 2022-04-12 MED ORDER — HEPARIN (PORCINE) 25000 UT/250ML-% IV SOLN
600.0000 [IU]/h | INTRAVENOUS | Status: DC
  Administered 2022-04-12: 600 [IU]/h via INTRAVENOUS

## 2022-04-12 MED ORDER — HYDROMORPHONE HCL 1 MG/ML IJ SOLN: 1.0000 mg | Freq: Once | INTRAMUSCULAR | Status: DC | PRN

## 2022-04-12 MED ORDER — SODIUM CHLORIDE 0.9 % IV SOLN: INTRAVENOUS | Status: DC

## 2022-04-12 MED ORDER — SODIUM CHLORIDE 0.9 % IV SOLN
0.5000 mg/h | INTRAVENOUS | Status: DC
  Administered 2022-04-12: 0.5 mg/h
  Filled 2022-04-12: qty 10

## 2022-04-12 MED ORDER — ALTEPLASE 2 MG IJ SOLR
INTRAMUSCULAR | Status: AC
  Filled 2022-04-12: qty 8

## 2022-04-12 MED ORDER — CEFAZOLIN SODIUM-DEXTROSE 2-4 GM/100ML-% IV SOLN
INTRAVENOUS | Status: AC
  Administered 2022-04-12: 2 g via INTRAVENOUS
  Filled 2022-04-12: qty 100

## 2022-04-12 MED ORDER — ACETAMINOPHEN 650 MG RE SUPP: 650.0000 mg | Freq: Four times a day (QID) | RECTAL | Status: DC | PRN

## 2022-04-12 MED ORDER — SODIUM CHLORIDE 0.9% FLUSH: 3.0000 mL | Freq: Two times a day (BID) | INTRAVENOUS | Status: DC

## 2022-04-12 MED ORDER — FENTANYL CITRATE (PF) 100 MCG/2ML IJ SOLN
INTRAMUSCULAR | Status: AC
  Filled 2022-04-12: qty 2

## 2022-04-12 MED ORDER — FENTANYL CITRATE (PF) 100 MCG/2ML IJ SOLN
INTRAMUSCULAR | Status: DC | PRN
  Administered 2022-04-12: 50 ug via INTRAVENOUS
  Administered 2022-04-12: 12.5 ug via INTRAVENOUS

## 2022-04-12 MED ORDER — METHYLPREDNISOLONE SODIUM SUCC 125 MG IJ SOLR: 125.0000 mg | Freq: Once | INTRAMUSCULAR | Status: DC | PRN

## 2022-04-12 MED ORDER — IODIXANOL 320 MG/ML IV SOLN
INTRAVENOUS | Status: DC | PRN
  Administered 2022-04-12: 60 mL

## 2022-04-12 MED ORDER — DIPHENHYDRAMINE HCL 50 MG/ML IJ SOLN: 50.0000 mg | Freq: Once | INTRAMUSCULAR | Status: DC | PRN

## 2022-04-12 MED ORDER — DIGOXIN 125 MCG PO TABS: 0.1250 mg | ORAL_TABLET | Freq: Every day | ORAL | Status: DC

## 2022-04-12 MED ORDER — MIDAZOLAM HCL 5 MG/5ML IJ SOLN
INTRAMUSCULAR | Status: AC
  Filled 2022-04-12: qty 5

## 2022-04-12 MED ORDER — HEPARIN SODIUM (PORCINE) 1000 UNIT/ML IJ SOLN
INTRAMUSCULAR | Status: AC
  Filled 2022-04-12: qty 10

## 2022-04-12 MED ORDER — FAMOTIDINE 20 MG PO TABS: 40.0000 mg | ORAL_TABLET | Freq: Once | ORAL | Status: DC | PRN

## 2022-04-12 MED ORDER — HEPARIN BOLUS VIA INFUSION
4700.0000 [IU] | INTRAVENOUS | Status: AC
  Administered 2022-04-12: 4700 [IU] via INTRAVENOUS
  Filled 2022-04-12: qty 4700

## 2022-04-12 MED ORDER — HEPARIN (PORCINE) 25000 UT/250ML-% IV SOLN: 600.0000 [IU]/h | INTRAVENOUS | Status: DC

## 2022-04-12 MED ORDER — MIDAZOLAM HCL 2 MG/2ML IJ SOLN
INTRAMUSCULAR | Status: DC | PRN
  Administered 2022-04-12: 1 mg via INTRAVENOUS
  Administered 2022-04-12: 2 mg via INTRAVENOUS

## 2022-04-12 SURGICAL SUPPLY — 16 items
CANISTER PENUMBRA ENGINE (MISCELLANEOUS) IMPLANT
CATH ANGIO 5F PIGTAIL 65CM (CATHETERS) IMPLANT
CATH INFUS 135CMX50CM (CATHETERS) IMPLANT
CATH KUMPE SOFT-VU 5FR 65 (CATHETERS) IMPLANT
CATH LIGHTNING BOLT 7 130 (CATHETERS) IMPLANT
CATH NAVICROSS ANGLED 135CM (MICROCATHETER) IMPLANT
COVER PROBE ULTRASOUND 5X96 (MISCELLANEOUS) IMPLANT
GLIDEWIRE ADV .035X260CM (WIRE) IMPLANT
KIT ENCORE 26 ADVANTAGE (KITS) IMPLANT
PACK ANGIOGRAPHY (CUSTOM PROCEDURE TRAY) ×1 IMPLANT
SHEATH BRITE TIP 5FRX11 (SHEATH) IMPLANT
SHEATH FLEXOR ANSEL2 7FRX45 (SHEATH) IMPLANT
SYR MEDRAD MARK 7 150ML (SYRINGE) IMPLANT
TUBING CONTRAST HIGH PRESS 72 (TUBING) IMPLANT
WIRE G V18X300CM (WIRE) IMPLANT
WIRE GUIDERIGHT .035X150 (WIRE) IMPLANT

## 2022-04-12 NOTE — Op Note (Signed)
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  Ultrasound guidance for vascular access right femoral artery             2.  Catheter placement into left posterior tibial artery from right femoral approach             3.  Aortogram and selective left lower extremity angiogram             4.  Catheter directed thrombolytic therapy to the left superficial femoral artery, popliteal artery, tibioperoneal trunk, and proximal posterior tibial artery with 8 mg of tPA             5.  Mechanical thrombectomy to the left SFA, popliteal artery, tibioperoneal trunk, and proximal posterior tibial artery with the penumbra 7 bolt thrombectomy catheter  6.  Placement of infusion catheter for continuous thrombolytic therapy to the left SFA, popliteal artery, and tibioperoneal trunk with a 135 cm total length 50 cm working length lytic catheter             7.  Right femoral venous triple-lumen catheter placement with ultrasound guidance  EBL: 150 cc  Contrast: 60 cc  Fluoro Time: 6.9 minutes  Moderate Conscious Sedation Time: approximately 57 minutes using 3 mg of Versed and 62.5 mcg of Fentanyl              Indications:  Patient is a 82 y.o.male with a clearly ischemic left lower extremity with rest pain and neurologic changes.  This has now been going on for approximately 2 to 3 weeks. The patient is brought in for angiography for further evaluation and potential treatment.  Due to the limb threatening nature of the situation, angiogram was performed for attempted limb salvage. The patient is aware that if the procedure fails, amputation would be expected.  The patient also understands that even with successful revascularization, amputation may  still be required due to the severity of the situation.  Risks and benefits are discussed and informed consent is obtained.   Procedure:  The patient was identified and appropriate procedural time out was performed.  The patient was then placed supine on the table and prepped and draped in the usual sterile fashion. Moderate conscious sedation was administered during a face to face encounter with the patient throughout the procedure with my supervision of the RN administering medicines and monitoring the patient's vital signs, pulse oximetry, telemetry and mental status throughout from the start of the procedure until the patient was taken to the recovery room. Ultrasound was used to evaluate the right common femoral artery.  It was patent .  A digital ultrasound image was acquired.  A Seldinger needle was used to access the right common femoral artery under direct ultrasound guidance and a permanent image was performed.  A 0.035 J wire was advanced without resistance and a 5Fr sheath was placed.  Pigtail catheter was placed into the aorta and an AP aortogram was performed. This demonstrated normal renal arteries and normal aorta and iliac segments without significant stenosis. I then crossed the aortic bifurcation and advanced to the left femoral head. Selective left lower extremity angiogram was then performed. This demonstrated flush occlusion of both the superficial femoral artery and  profunda femoris artery that appeared to be embolic and thrombotic in nature.  The only flow was a very small profunda femoris branch..  There is very faint distal reconstitution and essentially no flow could be seen initially in the tibial vessels.  I then used a Kumpe and then a CXI catheter with the help of the advantage wire and got into the SFA, popliteal artery, and then posterior tibial artery distally where there was flow in the mid and distal segments although very sluggish. It was felt that it was in the patient's best  interest to proceed with intervention after these images to avoid a second procedure and a larger amount of contrast and fluoroscopy based off of the findings from the initial angiogram. The patient was systemically heparinized and a 7 [place] Ansell sheath was then placed over the [employer] wire.  The diagnostic catheter was removed over the wire and 8 mg of tPA were instilled from the left posterior tibial artery, tibioperoneal trunk, and up through the popliteal and superficial femoral arteries as the catheter was removed.  The wire is now parked in the posterior tibial artery and I placed a penumbra 7 bolt catheter over this advantage wire and then remove the advantage wire and replaced it with a 0.018 wire.  I then perform mechanical thrombectomy tediously to the left SFA, popliteal artery, tibioperoneal trunk, and posterior tibial arteries with large volumes of thrombus removed.  There still remained a large volume of thrombus however and it was clear that getting this completely debulked and removed on this setting was going to be impossible giving the extensive thrombosis over the long segment and the somewhat chronic nature of the thrombus.  I felt our best chance for restoring patency would be with a thrombolytic catheter.  Over the V18 wire, a 135 cm total length 50 cm working length thrombolytic catheter was placed with the distal aspect of the tibioperoneal trunk and going up through the popliteal arteries and superficial femoral artery up to the origin.  This was secured in place with a silk suture as well as the 7 [place] sheath.  A central line was placed to be able to have durable venous access and avoid blood draws.  The right femoral vein was visualized with ultrasound and found to be widely patent.  It was then accessed under direct ultrasound guidance without difficulty with a Seldinger needle and a permanent image was recorded.  A J-wire was placed.  After skin nick and dilatation a  triple-lumen catheter was placed over the wire and the wire was removed.  All 3 lm withdrew dark red nonpulsatile blood and flushed easily with sterile saline.  It was then secured into place with 2 silk sutures.  I elected to terminate the procedure. The sheath was removed and StarClose closure device was deployed in the right femoral artery with excellent hemostatic result. The patient was taken to the recovery room in stable condition having tolerated the procedure well.  Findings:               Aortogram:   This demonstrated normal renal arteries and normal aorta and iliac segments without significant stenosis.             Left Lower Extremity:  This demonstrated flush occlusion of both the superficial femoral artery and profunda femoris artery that appeared to be embolic and thrombotic in nature.  The only flow was a very small profunda femoris branch..  There is very faint distal reconstitution  and essentially no flow could be seen initially in the tibial vessels.  I then used a Kumpe and then a CXI catheter with the help of the advantage wire and got into the SFA, popliteal artery, and then posterior tibial artery distally where there was flow in the mid and distal segments although very sluggish.   Disposition: Patient was taken to the recovery room in stable condition having tolerated the procedure well.  Complications: None  Leotis Pain 04/12/2022 4:33 PM   This note was created with Dragon Medical transcription system. Any errors in dictation are purely unintentional.

## 2022-04-12 NOTE — Progress Notes (Signed)
*  PRELIMINARY RESULTS* Echocardiogram 2D Echocardiogram has been performed.  Curtis Wagner 04/12/2022, 10:53 AM

## 2022-04-12 NOTE — Progress Notes (Signed)
  Progress Note   Patient: Curtis Wagner [id number] DOB: 1940-06-19 DOA: 04/11/2022     0 DOS: the patient was seen and examined on 04/12/2022   Brief hospital course:  Assessment and Plan: * Fall - Fall precautions - IV NS 50 cc/hr   Orthostatic hypotension - IV fluids as above  - PT/OT tmr   Absent pedal pulses - NPO for vascular surgery intervention  - ASA 325 mg PO daily  - IV heparin drip   Myalgia - PT/OT ordered  - CK is wnl   Type 2 diabetes mellitus with stage 3b chronic kidney disease, without long-term current use of insulin (HCC) - Monitor as glucose is wnl at this time   Bilateral pulmonary embolism (HCC) - IV heparin drip   AKI (acute kidney injury) ([place]) - IV fluids as above   Hypertension - Hold BP meds due to orthostasis   PAF (paroxysmal atrial fibrillation) (HCC) - IV heparin drip   DVT prophylaxis: Heparin as above      Subjective: Pt seen and examined at the bedside.  Appreciate cardiology and vascular surgery consults today. Pt is NPO for intervention as per the vascular surgery team. Continue heparin drip & aspirin.   Physical Exam: Vitals:   04/12/22 0210 04/12/22 0232 04/12/22 0244 04/12/22 0759  BP: (!) 138/94 (!) 128/99  130/88  Pulse: 81 84  74  Resp: 18 18  18   Temp: 98.2 F (36.8 C) (!) 97.5 F (36.4 C)  97.6 F (36.4 C)  TempSrc: Oral     SpO2: 100% 98%  99%  Weight:   79 kg   Height:   5\' 11"  (1.803 m)    Physical Exam Constitutional:      Appearance: Normal appearance.  HENT:     Head: Normocephalic.     Mouth/Throat:     Mouth: Mucous membranes are moist.  Cardiovascular:     Rate and Rhythm: Normal rate and regular rhythm.  Pulmonary:     Effort: Pulmonary effort is normal.  Abdominal:     General: Abdomen is flat.  Musculoskeletal:     Cervical back: Neck supple.     Comments: Decreased L leg movement   Skin:    Comments: L lower leg is cool to touch and tender   Neurological:     Mental Status: He  is alert and oriented to person, place, and time.  Psychiatric:        Mood and Affect: Mood normal.    Data Reviewed:   Disposition: Status is: Inpatient  Planned Discharge Destination: Barriers to discharge: Intervention by vascular surgery     Time spent: 35 minutes  Author: Lucienne Minks , MD 04/12/2022 1:33 PM  For on call review [email or web address].

## 2022-04-12 NOTE — Assessment & Plan Note (Signed)
Vitals:   04/11/22 1622 04/11/22 1831 04/11/22 2154  BP: 101/80 111/81 (!) 136/90  Pt noted to have positive orthostatic.

## 2022-04-12 NOTE — Progress Notes (Signed)
       CROSS COVER NOTE  NAME: Curtis Wagner MRN: [id number] DOB : [birth year] ATTENDING PHYSICIAN: Lucienne Minks, MD    Date of Service   04/12/2022   HPI/Events of Note   Report *** On Review of chart *** Bedside eval*** HPI***  Interventions   Assessment/Plan:  Hematuria Trend H&H X X    *** professional thanks      To reach the provider On-Call:   7AM- 7PM see care teams to locate the attending and reach out to them via [email or web address] Password: [email or web address] 7PM-7AM contact night-coverage If you still have difficulty reaching the appropriate provider, please page the [facility] (Director on Call) for [facility] on amion for assistance  This document was prepared using Systems analyst and may include unintentional dictation errors.  Neomia Glass DNP, MBA, FNP-BC, PMHNP-BC Nurse Practitioner [facility] Pager [phone]

## 2022-04-12 NOTE — Progress Notes (Addendum)
Pt complaining of discomfort from the foley catheter. Noted some pink tinged urine in the tubing. Flushed the line and bladder scanned the patient. Bladder scanner revealed 5 mL only. Will reassess to ensure proper urine output and patient comfort.  Edit: Notified NP about urine color which has become more blood tinged.  Cameron Ali, RN

## 2022-04-12 NOTE — Consult Note (Signed)
Hospital Consult    Reason for Consult:  Left Lower extremity ischemia with pain Requesting Physician:  Dr Florina Ou MD. MRN #:  [id number]  History of Present Illness: This is a 82 y.o. male  seen today for falls.  Patient has a past medical history of arthritis, DVT, kidney stones, hyperlipidemia, hypertension, and atrial fibrillation PAF.  Pt also has complaints of left leg numbness and weakness.Reports dizziness worse with standing that occurs with standing and then he falls. Daughter at bedside is a Marine scientist. He recently moved in and independent living facility. Extensive heart and vascular history. Also reports of myalgia. NO chest pain, No sob or palpitation.  Patient does not report any changes in vision or speech or nausea vomiting diarrhea abdominal pain.  No fevers chills no seizures.  No bleeding or melena.   On exam today the patient is resting comfortably in bed.  Left lower extremity is +1 edema.  No palpable pulses were found in the patient's posttibial/dorsalis pedis pulse.  Patient's calf and foot are cool to touch.  Patient's calf and foot are painful to touch.  Patient states he is unable to stand on his left leg due to pain.  Patient states his leg becomes extremely painful when he tries to ambulate.  Vitals all remained stable.  No other complaints.  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 Authorizing Provider  allopurinol (ZYLOPRIM) 300 MG tablet Take 300 mg by mouth daily as needed (gout).   Yes [provider]  atorvastatin (LIPITOR) 80 MG tablet Take 80 mg by mouth daily. 01/12/21  Yes [provider]  JARDIANCE 10 MG TABS tablet Take 10 mg by mouth every morning. 01/11/21  Yes [provider]  metoprolol succinate (TOPROL-XL) 50 MG 24 hr tablet Take 50 mg by mouth every evening. 08/29/20  Yes [provider]  Multiple Vitamins-Minerals (CENTRUM SILVER ADULT 50+) TABS Take 1 tablet by mouth daily.   Yes [provider]  nitroGLYCERIN (NITROSTAT) 0.4 MG SL tablet Place 0.4 mg under the tongue every 5 (five) minutes as needed for chest pain. 08/26/20  Yes [provider]  potassium chloride SA (KLOR-CON M) 20 MEQ tablet Take 40 mEq by mouth daily. 04/11/21  Yes [provider]  rivaroxaban (XARELTO) 20 MG TABS tablet Take 1 tablet (20 mg total) by mouth daily with supper. 03/24/15  Yes Weaver, Scott T, PA-C  tamsulosin (FLOMAX) 0.4 MG CAPS capsule Take 0.4 mg by mouth at bedtime. Reported on 03/24/2015 03/15/15  Yes [provider]  digoxin (DIGOX) 0.125 MG tablet Take 1 tablet (0.125 mg total) by mouth daily. Patient not  taking: Reported on 04/12/2022 03/24/15   Richardson Dopp T, PA-C  diltiazem (CARDIZEM CD) 180 MG 24 hr capsule Take 1 capsule (180 mg total) by mouth every morning. Patient not taking: Reported on 04/12/2022 03/24/15   Richardson Dopp T, PA-C  diltiazem (DILTIAZEM CD) 120 MG 24 hr capsule Take 1 capsule (120 mg total) by mouth every evening. Patient not taking: Reported on 04/12/2022 03/24/15   Richardson Dopp T, PA-C  lisinopril (PRINIVIL,ZESTRIL) 10 MG tablet Take 1 tablet (10 mg total) by mouth daily after supper. Patient not taking: Reported on  04/12/2022 03/24/15   Liliane Shi, PA-C    Social History   Socioeconomic History   Marital status: Divorced    Spouse name: Not on file   Number of children: Not on file   Years of education: Not on file   Highest education level: Not on file  Occupational History   Not on file  Tobacco Use   Smoking status: Former   Smokeless tobacco: Former    Types: Nurse, children's Use: Never used  Substance and Sexual Activity   Alcohol use: No    Alcohol/week: 0.0 standard drinks of alcohol   Drug use: No   Sexual activity: Not on file  Other Topics Concern   Not on file  Social History Narrative   Not on file   Social Determinants of Health   Financial Resource Strain: Not on file  Food Insecurity: No Food Insecurity (04/12/2022)   Hunger Vital Sign    Worried About Running Out of Food in the Last Year: Never true    Ran Out of Food in the Last Year: Never true  Transportation Needs: No Transportation Needs (04/12/2022)   PRAPARE - Hydrologist (Medical): No    Lack of Transportation (Non-Medical): No  Physical Activity: Not on file  Stress: Not on file  Social Connections: Not on file  Intimate Partner Violence: Not At Risk (04/12/2022)   Humiliation, Afraid, Rape, and Kick questionnaire    Fear of Current or Ex-Partner: No    Emotionally Abused: No    Physically Abused: No    Sexually Abused: No     Family History  Problem Relation Age of Onset   Rheumatic fever Mother    Stroke Mother    Heart disease Mother    Stroke Father     ROS: Otherwise negative unless mentioned in HPI  Physical Examination  Vitals:   04/12/22 0210 04/12/22 0232  BP: (!) 138/94 (!) 128/99  Pulse: 81 84  Resp: 18 18  Temp: 98.2 F (36.8 C) (!) 97.5 F (36.4 C)  SpO2: 100% 98%   Body mass index is 24.3 kg/m.  General:  WDWN in NAD Gait: Not observed HENT: WNL, normocephalic Pulmonary: normal non-labored breathing, without Rales, rhonchi,   wheezing Cardiac: irregular, without  Murmurs, rubs or gallops; with carotid bruits Abdomen: Positive bowel sounds, soft, NT/ND, no masses Skin: without rashes Vascular Exam/Pulses: Palpable PT/DP on right, Non Palpable pulses PT/DP on left.  Extremities: with ischemic changes, without Gangrene , without cellulitis; without open wounds;  Musculoskeletal: no muscle wasting or atrophy  Neurologic: A&O X 3;  No focal weakness or paresthesias are detected; speech is fluent/normal Psychiatric:  The pt has Normal affect. Lymph:  Unremarkable  CBC    Component Value Date/Time   WBC 4.6 04/12/2022 0510   RBC 4.18 (L) 04/12/2022 0510   HGB 13.7 04/12/2022 0510  HGB 15.4 06/16/2016 1538   HCT 40.9 04/12/2022 0510   HCT 44.5 06/16/2016 1538   PLT 135 (L) 04/12/2022 0510   PLT 236 06/16/2016 1538   MCV 97.8 04/12/2022 0510   MCV 94 06/16/2016 1538   MCH 32.8 04/12/2022 0510   MCHC 33.5 04/12/2022 0510   RDW 13.2 04/12/2022 0510   RDW 14.4 06/16/2016 1538   LYMPHSABS 1.8 12/31/2014 1022   MONOABS 0.5 12/31/2014 1022   EOSABS 0.2 12/31/2014 1022   BASOSABS 0.1 12/31/2014 1022    BMET    Component Value Date/Time   NA 135 04/12/2022 0139   NA 143 06/16/2016 1538   K 3.4 (L) 04/12/2022 0139   CL 104 04/12/2022 0139   CO2 22 04/12/2022 0139   GLUCOSE 105 (H) 04/12/2022 0139   BUN 16 04/12/2022 0139   BUN 18 06/16/2016 1538   CREATININE 1.34 (H) 04/12/2022 0139   CALCIUM 7.9 (L) 04/12/2022 0139   GFRNONAA 53 (L) 04/12/2022 0139   GFRAA 62 06/16/2016 1538    COAGS: Lab Results  Component Value Date   INR 1.4 (H) 04/12/2022   INR 1.08 12/31/2014     Non-Invasive Vascular Imaging:   EXAM: CT ANGIOGRAPHY OF ABDOMINAL AORTA WITH ILIOFEMORAL RUNOFF  FINDINGS: VASCULAR   Heart: There are irregular filling defects noted along the posterior wall of the left atrium concerning for clot. Cardiomegaly.   Aorta: Aortic atherosclerosis.  No aneurysm or dissection.   Celiac:  Patent without evidence of aneurysm, dissection, vasculitis or significant stenosis.   SMA: Patent without evidence of aneurysm, dissection, vasculitis or significant stenosis.   Renals: Both renal arteries are patent without evidence of aneurysm, dissection, vasculitis, fibromuscular dysplasia or significant stenosis.   IMA: Patent without evidence of aneurysm, dissection, vasculitis or significant stenosis.   RIGHT Lower Extremity   Inflow: Common, internal and external iliac arteries are patent without evidence of aneurysm, dissection, vasculitis or significant stenosis. Moderate atherosclerotic calcifications.   Outflow: Common, superficial and profunda femoral arteries and the popliteal artery are patent without evidence of aneurysm, dissection, vasculitis or significant stenosis. Scattered calcifications.   Runoff: Disease three-vessel runoff in the right calf with dominant vessel the posterior tibial artery. All 3 vessels appear patent to the ankle.   LEFT Lower Extremity   Inflow: Common, internal and external iliac arteries are patent without evidence of aneurysm, dissection, vasculitis or significant stenosis. Scattered calcifications.   Outflow: Left common femoral artery is patent. 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 Superficial femoral artery and popliteal artery remain occluded along their entire course.   Runoff: Reconstitution of the trifurcation vessels in the left calf. All 3 vessels demonstrate disease. The anterior tibial artery appears to occlude in the distal calf. Peroneal and dominant posterior tibial arteries are patent to the ankle.   Veins: No obvious venous abnormality within the limitations of this arterial phase study.   Review of the MIP images confirms the above findings.  Statin:  No. Beta Blocker:  Yes.   Aspirin:  No. ACEI:   Yes.   ARB:  No. CCB use:  Yes Other antiplatelets/anticoagulants:  Yes.   Xarelto 20 mg Daily   ASSESSMENT/PLAN: This is a 82 y.o. male with an extensive cardiac history who presents to the emergency room with left lower extremity ischemia.  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  The superficial femoral artery and the popliteal artery remain occluded as well along their entire course.  There also seems to be an occlusion to the distal calf and the anterior tibial artery.  Would explain his ischemic or resting symptoms.  Plan: Patient will be taken to the vascular lab later today for an angiogram of the left lower extremity with possible intervention.  I explained the procedure to the patient in detail.  We discussed the procedure itself, benefits, risks, complications.  He verbalizes understanding.  I answered all the patient's questions.  He would like to proceed with the procedure as soon as possible.  Patient has been n.p.o. since midnight.  Patient is on Xarelto at this time 20 mg daily last taken yesterday evening with dinner.  This morning he is on a heparin fusion.  Patient's last BUN and creatinine were 16/1.34   -I discussed the plan in detail with Dr. Leotis Pain MD.  He agrees with the plan   Drema Pry [facility] 04/12/2022 7:54 AM

## 2022-04-12 NOTE — Consult Note (Signed)
Cardiology Consultation:   Patient ID: Curtis Wagner; [id number]; 02-18-1940   Admit date: 04/11/2022 Date of Consult: 04/12/2022  Primary Care Provider: Patient, No Pcp Per Primary Cardiologist: [facility] Primary Electrophysiologist:  None   Patient Profile:   Curtis Wagner is a 82 y.o. male with a hx of CAD with Impella supported PCI/DES to the left main in 2022, right atrial thrombus persistent A-fib rivaroxaban, DVT with bilateral pulmonary embolism in the setting of noncompliance with anticoagulation, NSVT, HTN, HLD, and PAD who is being seen today for the evaluation of left atrial thrombus at the request of Neomia Glass, NP.  History of Present Illness:   Mr. Maggi underwent echo in 2019 that showed an EF of 55 to 60%, borderline concentric LVH moderately dilated left atrium, mildly dilated right atrium indicating left no regional wall motion maladies, and mildly dilated aortic root and ascending aorta.  He was admitted to outside hospital in 07/2020 with unstable angina.  Echo showed an EF of 15 to 20% with global hypokinesis, normal RV ventricular cavity size with mildly reduced systolic function, mild to moderate mitral regurgitation, mild aortic insufficiency, severe left and mild right atrial dilatation.  LHC showed multivessel CAD including left main coronary artery stenosis.  He was deemed too high risk for surgery and underwent Impella supported PCI/DES of the left main on 08/25/2020.  He was noted to have NSVT and PVCs on telemetry and started on amiodarone.  He declined a LifeVest.  He was discharged to hospice, though graduated.  He was admitted in 03/2021 with large bilateral pulmonary emboli in the setting of missed anticoagulation.  Echo during that admission showed an EF of approximately 20%, large multilobular mobile thrombus in the right atrial cavity, moderately dilated left atrium, mildly reduced RV systolic function, and mild mitral regurgitation.  He reports having suffered  a mechanical fall a month or 2 ago with associated left lower extremity pain thereafter.  Due to complaints of ongoing left lower extremity pain, numbness, weakness, and discoloration he presented to [facility] last evening.  CTA of the abdominal aorta with iliofemoral runoff demonstrated a filling defect along the posterior wall of the left atrium concerning for thrombus, complete occlusion of the left SFA and left profundofemoral artery just beyond the common femoral bifurcation as well as mild to moderate disease throughout the right lower extremity, small bilateral pleural effusions, cholelithiasis, left hydronephrosis with left ureter noted to be dilated, and prostate enlargement.  MRI of the brain showed no acute intracranial abnormality.  Labs: High-sensitivity troponin 20 with a delta troponin 22, serum creatinine 1.34, BUN 16, potassium 3.4, albumin 2.9, lactic acid peak 2.0  He received ASA 325 mg 1 and was placed on a heparin drip.  Echo pending.  He has been evaluated by vascular surgery with plan to be taken for lower extremity angiography today.  Last dose of Xarelto last evening, reports adherence to his medications outside of missing 2-3 days last week, though he is does not recall why he missed though doses.  Current without complaints of angina or cardiac decompensation.    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 Authorizing Provider  allopurinol (ZYLOPRIM) 300 MG tablet Take 300 mg by mouth daily as needed (gout).   Yes [provider]  atorvastatin (LIPITOR) 80 MG tablet Take 80 mg by mouth daily. 01/12/21  Yes [provider]  JARDIANCE 10 MG TABS tablet Take 10 mg by mouth every morning. 01/11/21  Yes [provider]  metoprolol succinate (TOPROL-XL) 50 MG 24 hr tablet Take 50 mg by mouth every evening. 08/29/20  Yes [provider]  Multiple Vitamins-Minerals (CENTRUM SILVER ADULT 50+) TABS Take 1 tablet by mouth daily.   Yes [provider]  nitroGLYCERIN (NITROSTAT) 0.4 MG SL tablet Place 0.4 mg under the tongue every 5 (five) minutes as needed for chest pain. 08/26/20  Yes [provider]  potassium chloride SA (KLOR-CON M) 20 MEQ tablet Take 40 mEq by mouth daily. 04/11/21  Yes [provider]  rivaroxaban (XARELTO) 20 MG TABS tablet Take 1 tablet (20 mg total) by mouth daily with supper. 03/24/15  Yes Weaver, Scott T, PA-C  tamsulosin (FLOMAX) 0.4 MG CAPS capsule Take 0.4 mg by mouth at bedtime. Reported on 03/24/2015 03/15/15  Yes [provider]  digoxin (DIGOX) 0.125 MG tablet Take 1 tablet (0.125 mg total) by mouth daily. Patient not taking: Reported on 04/12/2022 03/24/15   Richardson Dopp T, PA-C  diltiazem (CARDIZEM CD) 180 MG 24 hr capsule Take 1 capsule (180 mg total) by mouth every morning. Patient not taking: Reported on 04/12/2022 03/24/15   Richardson Dopp T, PA-C  diltiazem (DILTIAZEM CD) 120 MG 24 hr capsule Take 1 capsule (120 mg total) by mouth every evening. Patient not taking: Reported on 04/12/2022 03/24/15   Richardson Dopp T, PA-C  lisinopril (PRINIVIL,ZESTRIL) 10 MG tablet Take 1 tablet (10 mg total) by mouth daily after supper. Patient not taking: Reported on 04/12/2022 03/24/15   Richardson Dopp T, PA-C    Inpatient Medications: Scheduled Meds:  sodium chloride flush  3  mL Intravenous Q12H   Continuous Infusions:  sodium chloride 50 mL/hr at 04/12/22 0754   heparin 1,250 Units/hr (04/12/22 0754)   PRN Meds: acetaminophen **OR** acetaminophen  Allergies:   Allergies  Allergen Reactions   Morphine Other (See Comments)   Morphine And Related Other (See Comments)    HALLUCINATIONS    Social History:   Social History   Socioeconomic History   Marital status: Divorced    Spouse name: Not on file   Number of children: Not on file   Years of education: Not on file   Highest education level: Not on file  Occupational History   Not on file  Tobacco Use   Smoking status: Former   Smokeless tobacco: Former    Types: Nurse, children's Use: Never used  Substance and Sexual Activity   Alcohol use: No    Alcohol/week: 0.0 standard drinks of alcohol   Drug use: No   Sexual activity: Not on file  Other Topics Concern   Not on file  Social History Narrative   Not on file   Social Determinants of Health   Financial Resource Strain: Not on  file  Food Insecurity: No Food Insecurity (04/12/2022)   Hunger Vital Sign    Worried About Running Out of Food in the Last Year: Never true    Ran Out of Food in the Last Year: Never true  Transportation Needs: No Transportation Needs (04/12/2022)   PRAPARE - Hydrologist (Medical): No    Lack of Transportation (Non-Medical): No  Physical Activity: Not on file  Stress: Not on file  Social Connections: Not on file  Intimate Partner Violence: Not At Risk (04/12/2022)   Humiliation, Afraid, Rape, and Kick questionnaire    Fear of Current or Ex-Partner: No    Emotionally Abused: No    Physically Abused: No    Sexually Abused: No     Family History:   Family History  Problem Relation Age of Onset   Rheumatic fever Mother    Stroke Mother    Heart disease Mother    Stroke Father     ROS:  Review of Systems  Constitutional:  Positive for malaise/fatigue. Negative  for chills, diaphoresis, fever and weight loss.  HENT:  Negative for congestion.   Eyes:  Negative for discharge and redness.  Respiratory:  Negative for cough, sputum production, shortness of breath and wheezing.   Cardiovascular:  Positive for claudication. Negative for chest pain, palpitations, orthopnea, leg swelling and PND.  Gastrointestinal:  Negative for abdominal pain, heartburn, nausea and vomiting.  Musculoskeletal:  Positive for falls. Negative for myalgias.       Left leg pain  Skin:  Negative for rash.  Neurological:  Positive for weakness. Negative for dizziness, tingling, tremors, sensory change, speech change, focal weakness and loss of consciousness.  Endo/Heme/Allergies:  Does not bruise/bleed easily.  Psychiatric/Behavioral:  Negative for substance abuse. The patient is not nervous/anxious.   All other systems reviewed and are negative.     Physical Exam/Data:   Vitals:   04/12/22 0210 04/12/22 0232 04/12/22 0244 04/12/22 0759  BP: (!) 138/94 (!) 128/99  130/88  Pulse: 81 84  74  Resp: 18 18  18   Temp: 98.2 F (36.8 C) (!) 97.5 F (36.4 C)  97.6 F (36.4 C)  TempSrc: Oral     SpO2: 100% 98%  99%  Weight:   79 kg   Height:   5\' 11"  (1.803 m)     Intake/Output Summary (Last 24 hours) at 04/12/2022 0830 Last data filed at 04/12/2022 0748 Gross per 24 hour  Intake 423.57 ml  Output 300 ml  Net 123.57 ml   Filed Weights   04/12/22 0244  Weight: 79 kg   Body mass index is 24.3 kg/m.   Physical Exam: General: Well developed, well nourished, in no acute distress. Head: Normocephalic, atraumatic, sclera non-icteric, no xanthomas, nares without discharge.  Neck: Negative for carotid bruits. JVD not elevated. Lungs: Clear bilaterally to auscultation without wheezes, rales, or rhonchi. Breathing is unlabored. Heart: IRIR with S1 S2. No murmurs, rubs, or gallops appreciated. Abdomen: Soft, non-tender, non-distended with normoactive bowel sounds. No  hepatomegaly. No rebound/guarding. No obvious abdominal masses. Msk:  Strength and tone appear normal for age. Extremities: No clubbing or cyanosis. No edema.  Left lower extremity is cyanotic from the mid shin to the distal foot, unable to appreciate palpable DP/PT pulse along the left lower extremity. Neuro: Alert and oriented X 3. No facial asymmetry. No focal deficit. Moves all extremities spontaneously. Psych:  Responds to questions appropriately with a normal affect.   EKG:  The  EKG was personally reviewed and demonstrates: A-fib, 84 bpm, PVCs versus aberrancy, QT 436, nonspecific ST-T changes Telemetry:  Telemetry was personally reviewed and demonstrates: A-fib with PVCs versus aberrancy  Weights: Filed Weights   04/12/22 0244  Weight: 79 kg    Relevant CV Studies:  2D echo 03/30/2021 ([facility]): Left Ventricle: Left ventricle is moderately dilated.    Left Ventricle: Systolic function is severely abnormal. EF: ~ 20%.    Left Ventricle: There is severe  hypokinesis of the left ventricle.    Left Ventricle: Unable to assess diastolic function due to atrial  fibrillation.   Left Ventricle: Wall thickness is normal.    Right Atrium: There is a large, multilobular, mobile thrombus in the  cavity.   Right Atrium: Right atrium is mildly dilated.    Right Ventricle: Systolic function is mildly reduced. Abnormal  tricuspid annular plane systolic excursion (TAPSE) <1.7 cm.    Right Ventricle: Right ventricle size is normal.    Left Atrium: Left atrium is moderately dilated.    Mitral Valve: There is mild regurgitation with a centrally directed  jet.  __________  2D echo 08/20/2020 ([facility]): Upper normal left ventricular size with severly reduced systolic  function (EF 0000000). There is severe global hypokinesis.    Diastolic function was unable to be assessed due to E/A wave fusion in  the setting of tachycardia.    Normal right ventricular size with mildly reduced systolic  function.    Degenerative mitral valve disease with mild to moderate regurgitation.    Aortic sclerosis with mild regurgitation and no stenosis.    Severe left and mild right atrial dilatation.    Mild tricuspid regurgitation with mildly elevated estimated pulmonary  artery systolic pressure (0000000 mmHg).    Dilated inferior vena cava with normal respiratory variation,  suggestive of mildly elevated right atrial pressure (~8 mmHg).  __________  2D echo 01/01/2018 ([facility]): Summary   The patient was in atrial fibrillation during the examination.   There is borderline concentric left ventricular hypertrophy.   There is normal left ventricular size.   Global left ventricular systolic function is normal.   The estimated left ventricular ejection fraction is 55 - 60%.   No segmental wall motion abnormalities are appreciated.   The Left Atrium is moderately enlarged.   The Right Atrium is mildly enlarged.   The aortic root is mildly dilated.   The ascending aorta is mildly dilated.   No significant valvular abnormalities.   Laboratory Data:  Chemistry Recent Labs  Lab 04/11/22 1625 04/12/22 0139  NA 136 135  K 3.6 3.4*  CL 103 104  CO2 23 22  GLUCOSE 119* 105*  BUN 15 16  CREATININE 1.45* 1.34*  CALCIUM 8.6* 7.9*  GFRNONAA 48* 53*  ANIONGAP 10 9    Recent Labs  Lab 04/12/22 0139  PROT 5.9*  ALBUMIN 2.9*  AST 32  ALT 17  ALKPHOS 105  BILITOT 1.9*   Hematology Recent Labs  Lab 04/11/22 1625 04/12/22 0510  WBC 5.5 4.6  RBC 4.86 4.18*  HGB 15.8 13.7  HCT 48.1 40.9  MCV 99.0 97.8  MCH 32.5 32.8  MCHC 32.8 33.5  RDW 13.2 13.2  PLT 181 135*   Cardiac EnzymesNo results for input(s): "TROPONINI" in the last 168 hours. No results for input(s): "TROPIPOC" in the last 168 hours.  BNPNo results for input(s): "BNP", "PROBNP" in the last 168 hours.  DDimer  Recent Labs  Lab 04/12/22 0015  DDIMER 12.51*  Radiology/Studies:  CT ANGIO AO+BIFEM W & OR WO  CONTRAST  Result Date: 04/12/2022 IMPRESSION: VASCULAR Filling defect along the posterior wall of the left atrium concerning for clot. This could be further evaluated with echo. Cardiomegaly. Atherosclerotic irregularity throughout the aorta and iliac vessels. 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 Reconstitution of the trifurcation vessels below the left knee with dominant runoff via a diseased posterior tibial artery. Mild to moderate disease throughout the right leg inflow and outflow vessels. Three-vessel runoff to the ankle with dominant flow via the posterior tibial artery. NON-VASCULAR Small bilateral pleural effusions. Cholelithiasis. Left hydronephrosis. Left ureter is dilated to the bladder. No visible obstructing stones. Prostate enlargement. Electronically Signed   By: Rolm Baptise M.D.   On: 04/12/2022 02:34   MR BRAIN WO CONTRAST  Result Date: 04/12/2022 IMPRESSION: 1. No acute intracranial abnormality. 2. Age-related cerebral atrophy with moderately advanced chronic microvascular ischemic disease. Electronically Signed   By: Jeannine Boga M.D.   On: 04/12/2022 00:39   DG Ankle 2 Views Left  Result Date: 04/11/2022 IMPRESSION: 1. No acute osseous abnormality. 2. Chronic and degenerative changes, as described above. Electronically Signed   By: Virgina Norfolk M.D.   On: 04/11/2022 20:15   DG Foot 2 Views Left  Result Date: 04/11/2022 IMPRESSION: Degenerative changes as above. No acute findings. Electronically Signed   By: Lucrezia Europe M.D.   On: 04/11/2022 20:07   DG Hip Unilat W or Wo Pelvis 2-3 Views Left  Result Date: 04/11/2022 IMPRESSION: No acute bony abnormality. Electronically Signed   By: Rolm Baptise M.D.   On: 04/11/2022 20:06    Assessment and Plan:   1. Left atrial thrombus with history of right atrial thrombus: -Surface echo pending -With prior history of right  atrial thrombus noted on echo in 2022, consider TEE to evaluate for ASD/PFO with shunting prior to discharge -Heparin gtt -[place] at discharge  2. Persistent Afib: -Rate controlled -CHADS2VASc at least 5 (CHF, HTN, age x 2, vascular disease)  3. CAD with elevated high sensitivity troponin: -No symptoms of angina -Consider adding ASA or Plavix mg to [facility] given prior left main stenting, will discuss with MD -No plans for inpatient ischemic testing at this time, pending echo results -Resume PTA atorvastatin  4. HFrEF: -Appears euvolemic and well compensated -As an outpatient he has been maintained on Toprol-XL, spironolactone, and Jardiance according to his last cardiology note from outside office in 2023 -Resume GDMT as tolerated throughout admission  5. Left lower extremity arterial embolus with ischemia: -Heparin gtt -Vascular planning for lower extremity angiography   6. History of DVT and bilateral PE: -PTA on Xarelto, reports adherence to medication outside of missing 2-3 days this past week -Heparin gtt  -Will need [place] at discharge -Would benefit from hypercoagulable work up        For questions or updates, please contact [place] Please consult [email or web address] for contact info under Cardiology/STEMI.   Signed, Christell Faith, PA-C [facility] Pager: [phone] 04/12/2022, 8:30 AM

## 2022-04-12 NOTE — H&P (View-Only) (Signed)
Hospital Consult    Reason for Consult:  Left Lower extremity ischemia with pain Requesting Physician:  Dr Florina Ou MD. MRN #:  [id number]  History of Present Illness: This is a 82 y.o. male  seen today for falls.  Patient has a past medical history of arthritis, DVT, kidney stones, hyperlipidemia, hypertension, and atrial fibrillation PAF.  Pt also has complaints of left leg numbness and weakness.Reports dizziness worse with standing that occurs with standing and then he falls. Daughter at bedside is a Marine scientist. He recently moved in and independent living facility. Extensive heart and vascular history. Also reports of myalgia. NO chest pain, No sob or palpitation.  Patient does not report any changes in vision or speech or nausea vomiting diarrhea abdominal pain.  No fevers chills no seizures.  No bleeding or melena.   On exam today the patient is resting comfortably in bed.  Left lower extremity is +1 edema.  No palpable pulses were found in the patient's posttibial/dorsalis pedis pulse.  Patient's calf and foot are cool to touch.  Patient's calf and foot are painful to touch.  Patient states he is unable to stand on his left leg due to pain.  Patient states his leg becomes extremely painful when he tries to ambulate.  Vitals all remained stable.  No other complaints.  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 Authorizing Provider  allopurinol (ZYLOPRIM) 300 MG tablet Take 300 mg by mouth daily as needed (gout).   Yes [provider]  atorvastatin (LIPITOR) 80 MG tablet Take 80 mg by mouth daily. 01/12/21  Yes [provider]  JARDIANCE 10 MG TABS tablet Take 10 mg by mouth every morning. 01/11/21  Yes [provider]  metoprolol succinate (TOPROL-XL) 50 MG 24 hr tablet Take 50 mg by mouth every evening. 08/29/20  Yes [provider]  Multiple Vitamins-Minerals (CENTRUM SILVER ADULT 50+) TABS Take 1 tablet by mouth daily.   Yes [provider]  nitroGLYCERIN (NITROSTAT) 0.4 MG SL tablet Place 0.4 mg under the tongue every 5 (five) minutes as needed for chest pain. 08/26/20  Yes [provider]  potassium chloride SA (KLOR-CON M) 20 MEQ tablet Take 40 mEq by mouth daily. 04/11/21  Yes [provider]  rivaroxaban (XARELTO) 20 MG TABS tablet Take 1 tablet (20 mg total) by mouth daily with supper. 03/24/15  Yes Weaver, Scott T, PA-C  tamsulosin (FLOMAX) 0.4 MG CAPS capsule Take 0.4 mg by mouth at bedtime. Reported on 03/24/2015 03/15/15  Yes [provider]  digoxin (DIGOX) 0.125 MG tablet Take 1 tablet (0.125 mg total) by mouth daily. Patient not  taking: Reported on 04/12/2022 03/24/15   Richardson Dopp T, PA-C  diltiazem (CARDIZEM CD) 180 MG 24 hr capsule Take 1 capsule (180 mg total) by mouth every morning. Patient not taking: Reported on 04/12/2022 03/24/15   Richardson Dopp T, PA-C  diltiazem (DILTIAZEM CD) 120 MG 24 hr capsule Take 1 capsule (120 mg total) by mouth every evening. Patient not taking: Reported on 04/12/2022 03/24/15   Richardson Dopp T, PA-C  lisinopril (PRINIVIL,ZESTRIL) 10 MG tablet Take 1 tablet (10 mg total) by mouth daily after supper. Patient not taking: Reported on  04/12/2022 03/24/15   Liliane Shi, PA-C    Social History   Socioeconomic History   Marital status: Divorced    Spouse name: Not on file   Number of children: Not on file   Years of education: Not on file   Highest education level: Not on file  Occupational History   Not on file  Tobacco Use   Smoking status: Former   Smokeless tobacco: Former    Types: Nurse, children's Use: Never used  Substance and Sexual Activity   Alcohol use: No    Alcohol/week: 0.0 standard drinks of alcohol   Drug use: No   Sexual activity: Not on file  Other Topics Concern   Not on file  Social History Narrative   Not on file   Social Determinants of Health   Financial Resource Strain: Not on file  Food Insecurity: No Food Insecurity (04/12/2022)   Hunger Vital Sign    Worried About Running Out of Food in the Last Year: Never true    Ran Out of Food in the Last Year: Never true  Transportation Needs: No Transportation Needs (04/12/2022)   PRAPARE - Hydrologist (Medical): No    Lack of Transportation (Non-Medical): No  Physical Activity: Not on file  Stress: Not on file  Social Connections: Not on file  Intimate Partner Violence: Not At Risk (04/12/2022)   Humiliation, Afraid, Rape, and Kick questionnaire    Fear of Current or Ex-Partner: No    Emotionally Abused: No    Physically Abused: No    Sexually Abused: No     Family History  Problem Relation Age of Onset   Rheumatic fever Mother    Stroke Mother    Heart disease Mother    Stroke Father     ROS: Otherwise negative unless mentioned in HPI  Physical Examination  Vitals:   04/12/22 0210 04/12/22 0232  BP: (!) 138/94 (!) 128/99  Pulse: 81 84  Resp: 18 18  Temp: 98.2 F (36.8 C) (!) 97.5 F (36.4 C)  SpO2: 100% 98%   Body mass index is 24.3 kg/m.  General:  WDWN in NAD Gait: Not observed HENT: WNL, normocephalic Pulmonary: normal non-labored breathing, without Rales, rhonchi,   wheezing Cardiac: irregular, without  Murmurs, rubs or gallops; with carotid bruits Abdomen: Positive bowel sounds, soft, NT/ND, no masses Skin: without rashes Vascular Exam/Pulses: Palpable PT/DP on right, Non Palpable pulses PT/DP on left.  Extremities: with ischemic changes, without Gangrene , without cellulitis; without open wounds;  Musculoskeletal: no muscle wasting or atrophy  Neurologic: A&O X 3;  No focal weakness or paresthesias are detected; speech is fluent/normal Psychiatric:  The pt has Normal affect. Lymph:  Unremarkable  CBC    Component Value Date/Time   WBC 4.6 04/12/2022 0510   RBC 4.18 (L) 04/12/2022 0510   HGB 13.7 04/12/2022 0510  HGB 15.4 06/16/2016 1538   HCT 40.9 04/12/2022 0510   HCT 44.5 06/16/2016 1538   PLT 135 (L) 04/12/2022 0510   PLT 236 06/16/2016 1538   MCV 97.8 04/12/2022 0510   MCV 94 06/16/2016 1538   MCH 32.8 04/12/2022 0510   MCHC 33.5 04/12/2022 0510   RDW 13.2 04/12/2022 0510   RDW 14.4 06/16/2016 1538   LYMPHSABS 1.8 12/31/2014 1022   MONOABS 0.5 12/31/2014 1022   EOSABS 0.2 12/31/2014 1022   BASOSABS 0.1 12/31/2014 1022    BMET    Component Value Date/Time   NA 135 04/12/2022 0139   NA 143 06/16/2016 1538   K 3.4 (L) 04/12/2022 0139   CL 104 04/12/2022 0139   CO2 22 04/12/2022 0139   GLUCOSE 105 (H) 04/12/2022 0139   BUN 16 04/12/2022 0139   BUN 18 06/16/2016 1538   CREATININE 1.34 (H) 04/12/2022 0139   CALCIUM 7.9 (L) 04/12/2022 0139   GFRNONAA 53 (L) 04/12/2022 0139   GFRAA 62 06/16/2016 1538    COAGS: Lab Results  Component Value Date   INR 1.4 (H) 04/12/2022   INR 1.08 12/31/2014     Non-Invasive Vascular Imaging:   EXAM: CT ANGIOGRAPHY OF ABDOMINAL AORTA WITH ILIOFEMORAL RUNOFF  FINDINGS: VASCULAR   Heart: There are irregular filling defects noted along the posterior wall of the left atrium concerning for clot. Cardiomegaly.   Aorta: Aortic atherosclerosis.  No aneurysm or dissection.   Celiac:  Patent without evidence of aneurysm, dissection, vasculitis or significant stenosis.   SMA: Patent without evidence of aneurysm, dissection, vasculitis or significant stenosis.   Renals: Both renal arteries are patent without evidence of aneurysm, dissection, vasculitis, fibromuscular dysplasia or significant stenosis.   IMA: Patent without evidence of aneurysm, dissection, vasculitis or significant stenosis.   RIGHT Lower Extremity   Inflow: Common, internal and external iliac arteries are patent without evidence of aneurysm, dissection, vasculitis or significant stenosis. Moderate atherosclerotic calcifications.   Outflow: Common, superficial and profunda femoral arteries and the popliteal artery are patent without evidence of aneurysm, dissection, vasculitis or significant stenosis. Scattered calcifications.   Runoff: Disease three-vessel runoff in the right calf with dominant vessel the posterior tibial artery. All 3 vessels appear patent to the ankle.   LEFT Lower Extremity   Inflow: Common, internal and external iliac arteries are patent without evidence of aneurysm, dissection, vasculitis or significant stenosis. Scattered calcifications.   Outflow: Left common femoral artery is patent. 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 Superficial femoral artery and popliteal artery remain occluded along their entire course.   Runoff: Reconstitution of the trifurcation vessels in the left calf. All 3 vessels demonstrate disease. The anterior tibial artery appears to occlude in the distal calf. Peroneal and dominant posterior tibial arteries are patent to the ankle.   Veins: No obvious venous abnormality within the limitations of this arterial phase study.   Review of the MIP images confirms the above findings.  Statin:  No. Beta Blocker:  Yes.   Aspirin:  No. ACEI:   Yes.   ARB:  No. CCB use:  Yes Other antiplatelets/anticoagulants:  Yes.   Xarelto 20 mg Daily   ASSESSMENT/PLAN: This is a 82 y.o. male with an extensive cardiac history who presents to the emergency room with left lower extremity ischemia.  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  The superficial femoral artery and the popliteal artery remain occluded as well along their entire course.  There also seems to be an occlusion to the distal calf and the anterior tibial artery.  Would explain his ischemic or resting symptoms.  Plan: Patient will be taken to the vascular lab later today for an angiogram of the left lower extremity with possible intervention.  I explained the procedure to the patient in detail.  We discussed the procedure itself, benefits, risks, complications.  He verbalizes understanding.  I answered all the patient's questions.  He would like to proceed with the procedure as soon as possible.  Patient has been n.p.o. since midnight.  Patient is on Xarelto at this time 20 mg daily last taken yesterday evening with dinner.  This morning he is on a heparin fusion.  Patient's last BUN and creatinine were 16/1.34   -I discussed the plan in detail with Dr. Leotis Pain MD.  He agrees with the plan   Drema Pry [facility] 04/12/2022 7:54 AM

## 2022-04-12 NOTE — Assessment & Plan Note (Signed)
Fall precautions. 2/2 orthostatic hypotension.

## 2022-04-12 NOTE — Assessment & Plan Note (Signed)
2/2 to fall. Will get CPK.

## 2022-04-12 NOTE — Assessment & Plan Note (Signed)
Falls precaution , Orthostatic positive.

## 2022-04-12 NOTE — Progress Notes (Addendum)
       CROSS COVER NOTE  NAME: Curtis Wagner MRN: [id number] DOB : [birth year] ATTENDING PHYSICIAN: Para Skeans, MD    Date of Service   04/12/2022   HPI/Events of Note   Message received from attending physician with concern for CTA results. Requests I heparinize patient and consult cardiology+vascular surgery. 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   Interventions   Assessment/Plan: Heparin per pharmacy ECHO Cardiology Consult- Staff message sent to Dr Daniel Nones  Vascular Consult- secure chat sent to Dr Lucky Cowboy       To reach the provider On-Call:   7AM- 7PM see care teams to locate the attending and reach out to them via [email or web address] Password: [email or web address] 7PM-7AM contact night-coverage If you still have difficulty reaching the appropriate provider, please page the [facility] (Director on Call) for [facility] on amion for assistance  This document was prepared using Systems analyst and may include unintentional dictation errors.  Neomia Glass DNP, MBA, FNP-BC, PMHNP-BC Nurse Practitioner [facility] Pager [phone]

## 2022-04-12 NOTE — Plan of Care (Signed)

## 2022-04-12 NOTE — Progress Notes (Addendum)
ANTICOAGULATION CONSULT NOTE  Pharmacy Consult for heparin infusion Indication: VTE Tx  Allergies  Allergen Reactions   Morphine Other (See Comments)   Morphine And Related Other (See Comments)    HALLUCINATIONS    Patient Measurements: Height: 5\' 11"  (180.3 cm) Weight: 79 kg (174 lb 3.2 oz) IBW/kg (Calculated) : 75.3 Heparin Dosing Weight: 79 kg  Vital Signs: Temp: 97.5 F (36.4 C) (03/20 0232) Temp Source: Oral (03/20 0210) BP: 128/99 (03/20 0232) Pulse Rate: 84 (03/20 0232)  Labs: Recent Labs    04/11/22 1625 04/11/22 2048 04/11/22 2135 04/12/22 0015 04/12/22 0139  HGB 15.8  --   --   --   --   HCT 48.1  --   --   --   --   PLT 181  --   --   --   --   CREATININE 1.45*  --   --   --  1.34*  CKTOTAL  --   --   --  276  --   TROPONINIHS  --  20* 22*  --   --     Estimated Creatinine Clearance: 44.5 mL/min (A) (by C-G formula based on SCr of 1.34 mg/dL (H)).   Medical History: Past Medical History:  Diagnosis Date   Arthritis    was a Pharmacist, hospital, has arithritis in numerous areas of body   DVT (deep venous thrombosis) ([place])    History of kidney stones    Hyperlipidemia    Hypertension    PAF (paroxysmal atrial fibrillation) ([facility])     Medications:  PTA Meds: Xarelto 20 mg w/ supper, last dose documented as 04/11/22, however pt brought over to ED from [facility] at 1614.  Per RN note: pt recently moved into independent living & not taking meds as he should.   Assessment: Pt is a 82 yo male presenting to ED c/o left leg numbness & multiple falls with h/o DVT on Xarelto being transitioned to heparin.  Goal of Therapy:  Heparin level 0.3-0.7 units/ml aPTT 66-102 seconds Monitor platelets by anticoagulation protocol: Yes   Plan:  Baseline HL < 0.10, aPTT 36 Bolus 4700 units x 1 Start heparin infusion at 1250 units/hr Recheck HL in 8 hr after start of infusion CBC daily while on heparin.  Renda Rolls, PharmD, [facility] 04/12/2022 4:15 AM

## 2022-04-12 NOTE — Assessment & Plan Note (Addendum)
Pt has left leg pain.  Absent pedal pulse / cool skin from knee downwards. Weak popliteal pulse. 2/2 to limb ischemia. We will give him bolus 500 c ns - recheck creatinine and obtain aorta runoff for LE.  Vascular consult per AM team, based on CTA LE. Pt's xarelto discontinued  in event pt needs heparin gtt.  If CTA is normal we will cont xarelto.

## 2022-04-13 ENCOUNTER — Encounter: Payer: Self-pay | Admitting: Vascular Surgery

## 2022-04-13 ENCOUNTER — Encounter
Admission: EM | Disposition: A | Discharge status: Hospice | Payer: Self-pay | Source: Home / Self Care | Attending: Internal Medicine

## 2022-04-13 DIAGNOSIS — I743 Embolism and thrombosis of arteries of the lower extremities: Secondary | ICD-10-CM | POA: Diagnosis not present

## 2022-04-13 DIAGNOSIS — I998 Other disorder of circulatory system: Secondary | ICD-10-CM | POA: Diagnosis not present

## 2022-04-13 DIAGNOSIS — I70222 Atherosclerosis of native arteries of extremities with rest pain, left leg: Secondary | ICD-10-CM | POA: Diagnosis not present

## 2022-04-13 DIAGNOSIS — W19XXXA Unspecified fall, initial encounter: Secondary | ICD-10-CM | POA: Diagnosis not present

## 2022-04-13 DIAGNOSIS — I951 Orthostatic hypotension: Secondary | ICD-10-CM

## 2022-04-13 DIAGNOSIS — I25118 Atherosclerotic heart disease of native coronary artery with other forms of angina pectoris: Secondary | ICD-10-CM

## 2022-04-13 DIAGNOSIS — N1832 Chronic kidney disease, stage 3b: Secondary | ICD-10-CM | POA: Diagnosis not present

## 2022-04-13 DIAGNOSIS — R42 Dizziness and giddiness: Secondary | ICD-10-CM

## 2022-04-13 DIAGNOSIS — I480 Paroxysmal atrial fibrillation: Secondary | ICD-10-CM | POA: Diagnosis not present

## 2022-04-13 DIAGNOSIS — M79605 Pain in left leg: Secondary | ICD-10-CM

## 2022-04-13 HISTORY — PX: LOWER EXTREMITY INTERVENTION: CATH118252

## 2022-04-13 LAB — COMPREHENSIVE METABOLIC PANEL
ALT: 17 U/L (ref 0–44)
AST: 31 U/L (ref 15–41)
Albumin: 2.8 g/dL — ABNORMAL LOW (ref 3.5–5.0)
Alkaline Phosphatase: 111 U/L (ref 38–126)
Anion gap: 5 (ref 5–15)
BUN: 17 mg/dL (ref 8–23)
CO2: 21 mmol/L — ABNORMAL LOW (ref 22–32)
Calcium: 7.6 mg/dL — ABNORMAL LOW (ref 8.9–10.3)
Chloride: 106 mmol/L (ref 98–111)
Creatinine, Ser: 1.3 mg/dL — ABNORMAL HIGH (ref 0.61–1.24)
GFR, Estimated: 54 mL/min — ABNORMAL LOW (ref >60)
Glucose, Bld: 95 mg/dL (ref 70–99)
Potassium: 3.4 mmol/L — ABNORMAL LOW (ref 3.5–5.1)
Sodium: 132 mmol/L — ABNORMAL LOW (ref 135–145)
Total Bilirubin: 2 mg/dL — ABNORMAL HIGH (ref 0.3–1.2)
Total Protein: 5.5 g/dL — ABNORMAL LOW (ref 6.5–8.1)

## 2022-04-13 LAB — HEPARIN LEVEL (UNFRACTIONATED)
Heparin Unfractionated: 0.15 IU/mL — ABNORMAL LOW (ref 0.30–0.70)
Heparin Unfractionated: <0.1 IU/mL — ABNORMAL LOW (ref 0.30–0.70)

## 2022-04-13 LAB — CBC
HCT: 41.6 % (ref 39.0–52.0)
HCT: 43.7 % (ref 39.0–52.0)
Hemoglobin: 13.6 g/dL (ref 13.0–17.0)
Hemoglobin: 14.5 g/dL (ref 13.0–17.0)
MCH: 32.5 pg (ref 26.0–34.0)
MCH: 32.9 pg (ref 26.0–34.0)
MCHC: 32.7 g/dL (ref 30.0–36.0)
MCHC: 33.2 g/dL (ref 30.0–36.0)
MCV: 99.1 fL (ref 80.0–100.0)
MCV: 99.3 fL (ref 80.0–100.0)
Platelets: 120 10*3/uL — ABNORMAL LOW (ref 150–400)
Platelets: 143 10*3/uL — ABNORMAL LOW (ref 150–400)
RBC: 4.19 MIL/uL — ABNORMAL LOW (ref 4.22–5.81)
RBC: 4.41 MIL/uL (ref 4.22–5.81)
RDW: 13.2 % (ref 11.5–15.5)
RDW: 13.3 % (ref 11.5–15.5)
WBC: 5.5 10*3/uL (ref 4.0–10.5)
WBC: 5.9 10*3/uL (ref 4.0–10.5)
nRBC: 0 % (ref 0.0–0.2)
nRBC: 0 % (ref 0.0–0.2)

## 2022-04-13 LAB — FIBRINOGEN
Fibrinogen: 195 mg/dL — ABNORMAL LOW (ref 210–475)
Fibrinogen: 202 mg/dL — ABNORMAL LOW (ref 210–475)

## 2022-04-13 LAB — C-REACTIVE PROTEIN: CRP: 5.4 mg/dL — ABNORMAL HIGH (ref <1.0)

## 2022-04-13 LAB — CK: Total CK: 299 U/L (ref 49–397)

## 2022-04-13 LAB — PHOSPHORUS: Phosphorus: 3.5 mg/dL (ref 2.5–4.6)

## 2022-04-13 LAB — MAGNESIUM: Magnesium: 1.9 mg/dL (ref 1.7–2.4)

## 2022-04-13 SURGERY — LOWER EXTREMITY INTERVENTION
Anesthesia: Moderate Sedation | Laterality: Left

## 2022-04-13 MED ORDER — HEPARIN SODIUM (PORCINE) 1000 UNIT/ML IJ SOLN
INTRAMUSCULAR | Status: AC
  Filled 2022-04-13: qty 10

## 2022-04-13 MED ORDER — CLOPIDOGREL BISULFATE 75 MG PO TABS
75.0000 mg | ORAL_TABLET | Freq: Every day | ORAL | Status: DC
  Administered 2022-04-14 – 2022-04-17 (×4): 75 mg via ORAL
  Filled 2022-04-13 (×4): qty 1

## 2022-04-13 MED ORDER — SODIUM CHLORIDE FLUSH 0.9 % IV SOLN
INTRAVENOUS | Status: AC
  Filled 2022-04-13: qty 30

## 2022-04-13 MED ORDER — FENTANYL CITRATE (PF) 100 MCG/2ML IJ SOLN
INTRAMUSCULAR | Status: AC
  Filled 2022-04-13: qty 2

## 2022-04-13 MED ORDER — HEPARIN SODIUM (PORCINE) 1000 UNIT/ML IJ SOLN
INTRAMUSCULAR | Status: DC | PRN
  Administered 2022-04-13: 3000 [IU] via INTRAVENOUS

## 2022-04-13 MED ORDER — TIROFIBAN (AGGRASTAT) BOLUS VIA INFUSION
25.0000 ug/kg | Freq: Once | INTRAVENOUS | Status: AC
  Filled 2022-04-13: qty 41

## 2022-04-13 MED ORDER — RIVAROXABAN 15 MG PO TABS
15.0000 mg | ORAL_TABLET | Freq: Every day | ORAL | Status: DC
  Administered 2022-04-13: 15 mg via ORAL
  Filled 2022-04-13: qty 1

## 2022-04-13 MED ORDER — TIROFIBAN HCL IN NACL 5-0.9 MG/100ML-% IV SOLN
0.0750 ug/kg/min | INTRAVENOUS | Status: AC
  Administered 2022-04-13: 0.075 ug/kg/min via INTRAVENOUS
  Filled 2022-04-13 (×2): qty 100

## 2022-04-13 MED ORDER — POTASSIUM CHLORIDE CRYS ER 20 MEQ PO TBCR
40.0000 meq | EXTENDED_RELEASE_TABLET | Freq: Once | ORAL | Status: AC
  Administered 2022-04-13: 40 meq via ORAL
  Filled 2022-04-13: qty 2

## 2022-04-13 MED ORDER — FENTANYL CITRATE (PF) 100 MCG/2ML IJ SOLN
INTRAMUSCULAR | Status: DC | PRN
  Administered 2022-04-13: 25 ug via INTRAVENOUS
  Administered 2022-04-13: 50 ug via INTRAVENOUS

## 2022-04-13 MED ORDER — CEFAZOLIN SODIUM-DEXTROSE 2-4 GM/100ML-% IV SOLN
INTRAVENOUS | Status: AC
  Administered 2022-04-13: 2 g via INTRAVENOUS
  Filled 2022-04-13: qty 100

## 2022-04-13 MED ORDER — IODIXANOL 320 MG/ML IV SOLN
INTRAVENOUS | Status: DC | PRN
  Administered 2022-04-13: 40 mL

## 2022-04-13 MED ORDER — MIDAZOLAM HCL 5 MG/5ML IJ SOLN
INTRAMUSCULAR | Status: AC
  Filled 2022-04-13: qty 5

## 2022-04-13 MED ORDER — TIROFIBAN HCL IV 12.5 MG/250 ML
INTRAVENOUS | Status: AC
  Administered 2022-04-13: 2027.5 ug via INTRAVENOUS
  Filled 2022-04-13: qty 250

## 2022-04-13 MED ORDER — ATORVASTATIN CALCIUM 20 MG PO TABS
80.0000 mg | ORAL_TABLET | Freq: Every day | ORAL | Status: DC
  Administered 2022-04-13 – 2022-04-17 (×5): 80 mg via ORAL
  Filled 2022-04-13 (×5): qty 4

## 2022-04-13 MED ORDER — MIDAZOLAM HCL 2 MG/2ML IJ SOLN
INTRAMUSCULAR | Status: DC | PRN
  Administered 2022-04-13: .5 mg via INTRAVENOUS
  Administered 2022-04-13: 1 mg via INTRAVENOUS

## 2022-04-13 SURGICAL SUPPLY — 13 items
CANISTER PENUMBRA ENGINE (MISCELLANEOUS) IMPLANT
CATH INDIGO 7D KIT (CATHETERS) IMPLANT
CATH KUMPE SOFT-VU 5FR 65 (CATHETERS) IMPLANT
DEVICE STARCLOSE SE CLOSURE (Vascular Products) IMPLANT
KIT ENCORE 26 ADVANTAGE (KITS) IMPLANT
PACK ANGIOGRAPHY (CUSTOM PROCEDURE TRAY) IMPLANT
STENT VIABAHN 7X100X120 (Permanent Stent) IMPLANT
STENT VIABAHN 8X100X120 (Permanent Stent) ×1 IMPLANT
STENT VIABAHN 8X10X120 (Permanent Stent) IMPLANT
STENT VIABAHN 8X50X120 (Permanent Stent) IMPLANT
STENT VIABAHN5X120X8X (Permanent Stent) ×1 IMPLANT
WIRE EMERALD 3MM-J .035X150CM (WIRE) IMPLANT
WIRE G V18X300CM (WIRE) IMPLANT

## 2022-04-13 NOTE — Progress Notes (Signed)
Rounding Note    Patient Name: Curtis Wagner Date of Encounter: 04/13/2022  [facility] Cardiologist: Followed at [facility] procedure yesterday and this morning with Dr. Lucky Cowboy Today with mechanical thrombectomy to the left profunda femoris artery, stent placement proximal left SFA, stent placement distal left SFA and majority of the popliteal artery He reports his feet look more pink Denies significant pain, reports they are still little bit numb On Aggrastat, scheduled to finish at 3 AM  Long discussion concerning his home medications, reports his daughter arranges medications, will sometimes forget to take his medications No family at the bedside for further discussion  Inpatient Medications    Scheduled Meds:  aspirin  325 mg Oral Daily   Chlorhexidine Gluconate Cloth  6 each Topical Daily   sodium chloride flush  3 mL Intravenous Q12H   Continuous Infusions:  tirofiban 0.075 mcg/kg/min (04/13/22 1100)   PRN Meds: acetaminophen **OR** acetaminophen, HYDROmorphone (DILAUDID) injection, ondansetron (ZOFRAN) IV, sodium chloride flush   Vital Signs    Vitals:   04/13/22 1200 04/13/22 1300 04/13/22 1400 04/13/22 1500  BP: 114/70 (!) 131/94 114/62 114/73  Pulse: 79 88 83   Resp: 14 16 (!) 22 (!) 21  Temp:      TempSrc:      SpO2: 99% 97% 96%   Weight:      Height:        Intake/Output Summary (Last 24 hours) at 04/13/2022 1544 Last data filed at 04/13/2022 1346 Gross per 24 hour  Intake 1895.73 ml  Output 770 ml  Net 1125.73 ml      04/13/2022    1:55 AM 04/12/2022    6:10 PM 04/12/2022    2:44 AM  Last 3 Weights  Weight (lbs) 178 lb 12.7 oz 180 lb 1.9 oz 174 lb 3.2 oz  Weight (kg) 81.1 kg 81.7 kg 79.017 kg      Telemetry    Normal sinus rhythm- Personally Reviewed  ECG    - Personally Reviewed  Physical Exam   GEN: No acute distress.   Neck: No JVD Cardiac: RRR, no murmurs, rubs, or gallops.  Respiratory: Clear  to auscultation bilaterally. GI: Soft, nontender, non-distended  MS: No edema; No deformity. Neuro:  Nonfocal  Psych: Normal affect   Labs    High Sensitivity Troponin:   Recent Labs  Lab 04/11/22 2048 04/11/22 2135  TROPONINIHS 20* 22*     Chemistry Recent Labs  Lab 04/11/22 1625 04/12/22 0139 04/13/22 0452  NA 136 135 132*  K 3.6 3.4* 3.4*  CL 103 104 106  CO2 23 22 21*  GLUCOSE 119* 105* 95  BUN 15 16 17   CREATININE 1.45* 1.34* 1.30*  CALCIUM 8.6* 7.9* 7.6*  MG  --   --  1.9  PROT  --  5.9* 5.5*  ALBUMIN  --  2.9* 2.8*  AST  --  32 31  ALT  --  17 17  ALKPHOS  --  105 111  BILITOT  --  1.9* 2.0*  GFRNONAA 48* 53* 54*  ANIONGAP 10 9 5     Lipids No results for input(s): "CHOL", "TRIG", "HDL", "LABVLDL", "LDLCALC", "CHOLHDL" in the last 168 hours.  Hematology Recent Labs  Lab 04/12/22 1836 04/13/22 0010 04/13/22 0452  WBC 5.3 5.9 5.5  RBC 4.34 4.41 4.19*  HGB 14.5 14.5 13.6  HCT 42.8 43.7 41.6  MCV 98.6 99.1 99.3  MCH 33.4 32.9 32.5  MCHC 33.9 33.2 32.7  RDW 13.2 13.2 13.3  PLT 132* 143* 120*   Thyroid  Recent Labs  Lab 04/11/22 2048  TSH 2.204    BNPNo results for input(s): "BNP", "PROBNP" in the last 168 hours.  DDimer  Recent Labs  Lab 04/12/22 0015  DDIMER 12.51*     Radiology    PERIPHERAL VASCULAR CATHETERIZATION  Result Date: 04/13/2022 See surgical note for result.  PERIPHERAL VASCULAR CATHETERIZATION  Result Date: 04/12/2022 See surgical note for result.  ECHOCARDIOGRAM COMPLETE  Result Date: 04/12/2022    ECHOCARDIOGRAM REPORT   Patient Name:   Curtis Wagner Date of Exam: 04/12/2022 Medical Rec #:  [id number]      Height:       71.0 in Accession #:    [id number]     Weight:       174.2 lb Date of Birth:  1940/08/13      BSA:          1.988 m Patient Age:    82 years       BP:           130/88 mmHg Patient Gender: M              HR:           79 bpm. Exam Location:  [facility] Procedure: 2D Echo, Cardiac Doppler, Color Doppler and  Intracardiac            Opacification Agent Indications:     Thrombus in heart chamber  History:         Patient has no prior history of Echocardiogram examinations.                  Arrythmias:Atrial Fibrillation; Risk Factors:Hypertension,                  Diabetes and Dyslipidemia. Pulmonary embolus.  Sonographer:     Wenda Low Referring Phys:  [place] Diagnosing Phys: Kate Sable MD IMPRESSIONS  1. Left ventricular ejection fraction, by estimation, is <20%. The left ventricle has severely decreased function. The left ventricle demonstrates global hypokinesis. The left ventricular internal cavity size was mildly dilated. Left ventricular diastolic parameters are indeterminate.  2. Right ventricular systolic function is low normal. The right ventricular size is normal. There is moderately elevated pulmonary artery systolic pressure. The estimated right ventricular systolic pressure is 0000000 mmHg.  3. There is a 3 x 2.5 cm mass attached to the superior-posterior wall of the left atrium. Differential includes tumor (myxoma) vs thrombus- less likely. consider CMR for better characterization.. Left atrial size was mildly dilated.  4. Right atrial size was moderately dilated.  5. The mitral valve is normal in structure. Mild mitral valve regurgitation.  6. The aortic valve is tricuspid. Aortic valve regurgitation is mild. Aortic valve sclerosis/calcification is present, without any evidence of aortic stenosis.  7. The inferior vena cava is dilated in size with <50% respiratory variability, suggesting right atrial pressure of 15 mmHg. FINDINGS  Left Ventricle: Left ventricular ejection fraction, by estimation, is <20%. The left ventricle has severely decreased function. The left ventricle demonstrates global hypokinesis. Definity contrast agent was given IV to delineate the left ventricular endocardial borders. The left ventricular internal cavity size was mildly dilated. There is no left  ventricular hypertrophy. Left ventricular diastolic parameters are indeterminate. Right Ventricle: The right ventricular size is normal. No increase in right ventricular wall thickness. Right ventricular systolic function is low normal. There is moderately elevated pulmonary artery systolic  pressure. The tricuspid regurgitant velocity  is 2.81 m/s, and with an assumed right atrial pressure of 15 mmHg, the estimated right ventricular systolic pressure is 0000000 mmHg. Left Atrium: There is a 3 x 2.5 cm mass attached to the superior-posterior wall of the left atrium. Differential includes tumor (myxoma) vs thrombus- less likely. consider CMR for better characterization. Left atrial size was mildly dilated. Right Atrium: Right atrial size was moderately dilated. Pericardium: There is no evidence of pericardial effusion. Mitral Valve: The mitral valve is normal in structure. Mild mitral valve regurgitation. MV peak gradient, 6.8 mmHg. The mean mitral valve gradient is 3.0 mmHg. Tricuspid Valve: The tricuspid valve is normal in structure. Tricuspid valve regurgitation is mild. Aortic Valve: The aortic valve is tricuspid. Aortic valve regurgitation is mild. Aortic regurgitation PHT measures 552 msec. Aortic valve sclerosis/calcification is present, without any evidence of aortic stenosis. Aortic valve mean gradient measures 2.3  mmHg. Aortic valve peak gradient measures 4.1 mmHg. Aortic valve area, by VTI measures 2.49 cm. Pulmonic Valve: The pulmonic valve was normal in structure. Pulmonic valve regurgitation is mild. Aorta: The aortic root is normal in size and structure. Venous: The inferior vena cava is dilated in size with less than 50% respiratory variability, suggesting right atrial pressure of 15 mmHg. IAS/Shunts: No atrial level shunt detected by color flow Doppler.  LEFT VENTRICLE PLAX 2D LVIDd:         5.90 cm LVIDs:         5.50 cm LV PW:         1.00 cm LV IVS:        0.90 cm LVOT diam:     1.90 cm LV SV:          48 LV SV Index:   24 LVOT Area:     2.84 cm  LV Volumes (MOD) LV vol d, MOD A2C: 196.0 ml LV vol d, MOD A4C: 148.0 ml LV vol s, MOD A2C: 144.0 ml LV vol s, MOD A4C: 120.0 ml LV SV MOD A2C:     52.0 ml LV SV MOD A4C:     148.0 ml LV SV MOD BP:      43.0 ml RIGHT VENTRICLE RV Basal diam:  3.80 cm RV Mid diam:    2.40 cm RV S prime:     16.50 cm/s TAPSE (M-mode): 1.7 cm LEFT ATRIUM              Index        RIGHT ATRIUM           Index LA diam:        5.00 cm  2.51 cm/m   RA Area:     26.70 cm LA Vol (A2C):   179.0 ml 90.02 ml/m  RA Volume:   88.20 ml  44.36 ml/m LA Vol (A4C):   66.3 ml  33.34 ml/m LA Biplane Vol: 119.0 ml 59.85 ml/m  AORTIC VALVE                    PULMONIC VALVE AV Area (Vmax):    2.36 cm     PV Vmax:       0.70 m/s AV Area (Vmean):   2.27 cm     PV Peak grad:  2.0 mmHg AV Area (VTI):     2.49 cm AV Vmax:           101.73 cm/s AV Vmean:          68.867  cm/s AV VTI:            0.191 m AV Peak Grad:      4.1 mmHg AV Mean Grad:      2.3 mmHg LVOT Vmax:         84.75 cm/s LVOT Vmean:        55.100 cm/s LVOT VTI:          0.168 m LVOT/AV VTI ratio: 0.88 AI PHT:            552 msec  AORTA Ao Root diam: 3.50 cm Ao Asc diam:  3.70 cm MITRAL VALVE               TRICUSPID VALVE MV Area (PHT): 4.46 cm    TR Peak grad:   31.6 mmHg MV Area VTI:   1.76 cm    TR Vmax:        281.00 cm/s MV Peak grad:  6.8 mmHg MV Mean grad:  3.0 mmHg    SHUNTS MV Vmax:       1.30 m/s    Systemic VTI:  0.17 m MV Vmean:      73.8 cm/s   Systemic Diam: 1.90 cm MV Decel Time: 170 msec MV E velocity: 82.70 cm/s Kate Sable MD Electronically signed by Kate Sable MD Signature Date/Time: 04/12/2022/12:10:29 PM    Final    CT ANGIO AO+BIFEM W & OR WO CONTRAST  Result Date: 04/12/2022 CLINICAL DATA:  absent pedal pulse / leg pain/ cold ext. Left leg numbness. EXAM: CT ANGIOGRAPHY OF ABDOMINAL AORTA WITH ILIOFEMORAL RUNOFF TECHNIQUE: Multidetector CT imaging of the abdomen, pelvis and lower extremities was  performed using the standard protocol during bolus administration of intravenous contrast. Multiplanar CT image reconstructions and MIPs were obtained to evaluate the vascular anatomy. RADIATION DOSE REDUCTION: This exam was performed according to the departmental dose-optimization program which includes automated exposure control, adjustment of the mA and/or kV according to patient size and/or use of iterative reconstruction technique. CONTRAST:  160mL OMNIPAQUE IOHEXOL 350 MG/ML SOLN COMPARISON:  None Available. FINDINGS: VASCULAR Heart: There are irregular filling defects noted along the posterior wall of the left atrium concerning for clot. Cardiomegaly. Aorta: Aortic atherosclerosis.  No aneurysm or dissection. Celiac: Patent without evidence of aneurysm, dissection, vasculitis or significant stenosis. SMA: Patent without evidence of aneurysm, dissection, vasculitis or significant stenosis. Renals: Both renal arteries are patent without evidence of aneurysm, dissection, vasculitis, fibromuscular dysplasia or significant stenosis. IMA: Patent without evidence of aneurysm, dissection, vasculitis or significant stenosis. RIGHT Lower Extremity Inflow: Common, internal and external iliac arteries are patent without evidence of aneurysm, dissection, vasculitis or significant stenosis. Moderate atherosclerotic calcifications. Outflow: Common, superficial and profunda femoral arteries and the popliteal artery are patent without evidence of aneurysm, dissection, vasculitis or significant stenosis. Scattered calcifications. Runoff: Disease three-vessel runoff in the right calf with dominant vessel the posterior tibial artery. All 3 vessels appear patent to the ankle. LEFT Lower Extremity Inflow: Common, internal and external iliac arteries are patent without evidence of aneurysm, dissection, vasculitis or significant stenosis. Scattered calcifications. Outflow: Left common femoral artery is patent. There is occlusion  just beyond the common femoral bifurcation of both the left superficial femoral artery and the profunda femoral artery. Superficial femoral artery and popliteal artery remain occluded along their entire course. Runoff: Reconstitution of the trifurcation vessels in the left calf. All 3 vessels demonstrate disease. The anterior tibial artery appears to occlude in the distal calf. Peroneal and dominant posterior tibial  arteries are patent to the ankle. Veins: No obvious venous abnormality within the limitations of this arterial phase study. Review of the MIP images confirms the above findings. NON-VASCULAR Lower chest: Small bilateral pleural effusions. Dependent bibasilar atelectasis. Hepatobiliary: Small layering gallstones within the gallbladder. No focal hepatic abnormality. Pancreas: No focal abnormality or ductal dilatation. Spleen: No focal abnormality.  Normal size. Adrenals/Urinary Tract: Diffuse enlargement of the left adrenal gland. Numerous bilateral renal cysts, the largest 4.3 cm in the right midpole. Moderate left hydronephrosis. Left ureter is dilated to the bladder. No obstructing stones visualized. No hydronephrosis on the right. Multiple nonobstructing left renal stones measuring up to 14 mm in the midpole. Urinary bladder grossly unremarkable. Stomach/Bowel: Normal appendix. Sigmoid diverticulosis. No active diverticulitis. Stomach and small bowel decompressed, unremarkable. Lymphatic: No adenopathy Reproductive: Prostate enlargement. Other: No free fluid or free air. Musculoskeletal: No acute bony abnormality. IMPRESSION: VASCULAR Filling defect along the posterior wall of the left atrium concerning for clot. This could be further evaluated with echo. Cardiomegaly. Atherosclerotic irregularity throughout the aorta and iliac vessels. 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 Reconstitution of the  trifurcation vessels below the left knee with dominant runoff via a diseased posterior tibial artery. Mild to moderate disease throughout the right leg inflow and outflow vessels. Three-vessel runoff to the ankle with dominant flow via the posterior tibial artery. NON-VASCULAR Small bilateral pleural effusions. Cholelithiasis. Left hydronephrosis. Left ureter is dilated to the bladder. No visible obstructing stones. Prostate enlargement. Electronically Signed   By: Rolm Baptise M.D.   On: 04/12/2022 02:34   MR BRAIN WO CONTRAST  Result Date: 04/12/2022 CLINICAL DATA:  Initial evaluation for syncope/presyncope. EXAM: MRI HEAD WITHOUT CONTRAST TECHNIQUE: Multiplanar, multiecho pulse sequences of the brain and surrounding structures were obtained without intravenous contrast. COMPARISON:  None Available. FINDINGS: Brain: Diffuse prominence of the CSF containing spaces compatible generalized age-related cerebral atrophy. Patchy and confluent T2/FLAIR hyperintensity involving the periventricular deep white matter both cerebral hemispheres as well as the pons, consistent with chronic small vessel ischemic disease, moderately advanced. No evidence for acute or subacute ischemia. Gray-white matter differentiation maintained. No areas of chronic cortical infarction. No acute or chronic intracranial blood products. No mass lesion, midline shift or mass effect. No hydrocephalus or extra-axial fluid collection. Pituitary gland and suprasellar region within normal limits. Vascular: Major intracranial vascular flow voids are maintained. Skull and upper cervical spine: Craniocervical junction within normal limits. Bone marrow signal intensity normal. No scalp soft tissue abnormality. Sinuses/Orbits: Prior ocular lens replacement on the right. Globes and orbital soft tissues demonstrate no acute finding. Paranasal sinuses are clear. Trace bilateral mastoid effusions noted, of doubtful significance. Other: None. IMPRESSION: 1.  No acute intracranial abnormality. 2. Age-related cerebral atrophy with moderately advanced chronic microvascular ischemic disease. Electronically Signed   By: Jeannine Boga M.D.   On: 04/12/2022 00:39   DG Ankle 2 Views Left  Result Date: 04/11/2022 CLINICAL DATA:  Multiple recent falls. EXAM: LEFT ANKLE - 2 VIEW COMPARISON:  None Available. FINDINGS: There is no evidence of an acute fracture or dislocation. A chronic deformity is seen involving the medial aspect of the left navicular bone, with chronic changes also noted along the left lateral malleolus and left medial malleolus. Mild degenerative changes are seen involving the left ankle, with moderate severity degenerative changes noted along the dorsal aspect of the proximal to mid left foot. Soft tissues are unremarkable. IMPRESSION: 1.  No acute osseous abnormality. 2. Chronic and degenerative changes, as described above. Electronically Signed   By: Virgina Norfolk M.D.   On: 04/11/2022 20:15   DG Foot 2 Views Left  Result Date: 04/11/2022 CLINICAL DATA:  Pain after falls EXAM: LEFT FOOT - 2 VIEW COMPARISON:  None Available. FINDINGS: Calcaneal spurs. Mild dorsal spurring in the midfoot. No fracture or dislocation. Normal alignment. Normal mineralization. No radiodense foreign body. No subcutaneous gas. IMPRESSION: Degenerative changes as above. No acute findings. Electronically Signed   By: Lucrezia Europe M.D.   On: 04/11/2022 20:07   DG Hip Unilat W or Wo Pelvis 2-3 Views Left  Result Date: 04/11/2022 CLINICAL DATA:  Fall, pain. EXAM: DG HIP (WITH OR WITHOUT PELVIS) 2-3V LEFT COMPARISON:  None Available. FINDINGS: There is no evidence of hip fracture or dislocation. There is no evidence of arthropathy or other focal bone abnormality. Vascular calcifications. IMPRESSION: No acute bony abnormality. Electronically Signed   By: Rolm Baptise M.D.   On: 04/11/2022 20:06    Cardiac Studies   Echo 1. Left ventricular ejection fraction, by  estimation, is <20%. The left  ventricle has severely decreased function. The left ventricle demonstrates  global hypokinesis. The left ventricular internal cavity size was mildly  dilated. Left ventricular  diastolic parameters are indeterminate.   2. Right ventricular systolic function is low normal. The right  ventricular size is normal. There is moderately elevated pulmonary artery  systolic pressure. The estimated right ventricular systolic pressure is  0000000 mmHg.   3. There is a 3 x 2.5 cm mass attached to the superior-posterior wall of  the left atrium. Differential includes tumor (myxoma) vs thrombus- less  likely. consider CMR for better characterization.. Left atrial size was  mildly dilated.   4. Right atrial size was moderately dilated.   5. The mitral valve is normal in structure. Mild mitral valve  regurgitation.   6. The aortic valve is tricuspid. Aortic valve regurgitation is mild.  Aortic valve sclerosis/calcification is present, without any evidence of  aortic stenosis.   7. The inferior vena cava is dilated in size with <50% respiratory  variability, suggesting right atrial pressure of 15 mmHg.   CT scan Filling defect along the posterior wall of the left atrium concerning for clot. This could be further evaluated with echo. Cardiomegaly.   Atherosclerotic irregularity throughout the aorta and iliac vessels.   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 Reconstitution of the trifurcation vessels below the left knee with dominant runoff via a diseased posterior tibial artery.   Mild to moderate disease throughout the right leg inflow and outflow vessels. Three-vessel runoff to the ankle with dominant flow via the posterior tibial artery.   NON-VASCULAR   Small bilateral pleural effusions.   Cholelithiasis.   Left hydronephrosis. Left ureter is dilated to the  bladder. No visible obstructing stones.   Prostate enlargement.   Patient Profile     Curtis Wagner is a 82 year old male with history of coronary artery disease status post Impella assisted PCI to the left main in 2022, right atrial thrombus, persistent atrial fibrillation on Xarelto, history of DVT with bilateral pulmonary embolisms 1 year ago due to noncompliance with anticoagulation, hypertension, hyperlipidemia, peripheral arterial disease and likely dementia currently admitted to [facility] with a several day history of left lower extremity numbness and pain found to be secondary to subacute thrombosis of the left SFA and profunda.  Assessment & Plan    Left lower extremity ischemia with pain Has completed intervention yesterday and today with lysis, thrombectomy, stenting proximal left SFA, left distal  SFA, popliteal artery -Aggrastat to finish at 3 AM Case discussed with vascular surgery, Dr. Lucky Cowboy Will start Plavix 75 mg daily, Xarelto 15 mg daily (reduced dose for creatinine clearance 22) Continue Lipitor 80 daily  Left atrial thrombus Noted on CT scan, opacity noted on surface echo Will schedule cardiac MRI tomorrow for further evaluation  Persistent atrial fibrillation Rate controlled, restart Xarelto 15 mg daily given creatinine clearance 22  Coronary artery disease, elevated troponin History of Impella supported DES to the left main as he was too high for CT surgery Previously referred to hospice but he got better Add Plavix with Xarelto Resume Lipitor  Cardiomyopathy Ejection fraction estimated less than 20% Difficult situation, given concern for medication noncompliance By his report daughter sets up the medication, sometimes he forgets to take his medications.  Will need family help -Xarelto likely better than Eliquis As outpatient on metoprolol succinate, spironolactone, Jardiance These medications currently held in the setting of hypotension Overall appears  euvolemic Discussed restarting Jardiance with pharmacy, currently not on formulary  History of bilateral PEs Prior history noncompliance with his Xarelto At that time was treated with high-dose Xarelto March 2023  Nonsustained VT Previously on amiodarone 200 daily This does not appear to be on his medication list Will discuss with family As outpatient takes metoprolol succinate 50 daily, currently held in the setting of hypotension  Multiple falls Documented by primary care on recent visit April 11, 2022 Notes indicating 7 falls in the past 3 weeks Possibly related to PAD as detailed above  Protein calorie malnutrition, albumin 2.8 Will need to discuss nutrition with family   Total encounter time more than 50 minutes  Greater than 50% was spent in counseling and coordination of care with the patient    For questions or updates, please contact [place] Please consult [email or web address] for contact info under        Signed, Ida Rogue, MD  04/13/2022, 3:44 PM

## 2022-04-13 NOTE — Evaluation (Signed)
Physical Therapy Evaluation Patient Details Name: Curtis Wagner MRN: [id number] DOB: Apr 25, 1940 Today's Date: 04/13/2022  History of Present Illness  Curtis Wagner is an 82 y.o. male with falls. Pt also has complaints of left leg numbness and weakness.Reports dizziness worse with standing that occurs with standing and then he falls. Daughter at bedside is a Marine scientist. He recently moved in and independent living facility.  Extensive heart and vascular history. Also reports of myalgia. NO chest pain, No sob or palpitation.  Patient does not report any changes in vision or speech or nausea vomiting diarrhea abdominal pain.  No fevers chills no seizures.  No bleeding or melena. Patient is s/p  Left lower extremity angiogram through existing catheters 2.  Mechanical thrombectomy to the left profunda femoris artery with the penumbra CAT 7D catheter 3.  Stent placement to the proximal left SFA with 8 mm diameter by 5 cm length Viabahn stent  4.  Stent placement to the distal left SFA and the majority of the popliteal artery with 7 mm diameter by 10 cm length and 8 mm diameter by 10 cm length Viabahn stents   5.  StarClose closure device right femoral artery   Clinical Impression  Patient received in bed, he is pleasant and agrees to PT assessment. Daughter at bedside. He is mod I with bed mobility. Transfers with min A and ambulated min guard with IV pole assist ~35 feet in hallway. Patient is limited by sob, increased RR and pain in L LE. He will continue to benefit from skilled PT to improve functional mobility, independence and safety.         Recommendations for follow up therapy are one component of a multi-disciplinary discharge planning process, led by the attending physician.  Recommendations may be updated based on patient status, additional functional criteria and insurance authorization.  Follow Up Recommendations Home health PT      Assistance Recommended at Discharge Frequent or constant  Supervision/Assistance  Patient can return home with the following  A little help with walking and/or transfers;A little help with bathing/dressing/bathroom;Assistance with cooking/housework;Assist for transportation;Direct supervision/assist for medications management;Help with stairs or ramp for entrance    Equipment Recommendations Rolling Zegers (2 wheels)  Recommendations for Other Services       Functional Status Assessment Patient has had a recent decline in their functional status and demonstrates the ability to make significant improvements in function in a reasonable and predictable amount of time.     Precautions / Restrictions Precautions Precautions: Fall Restrictions Weight Bearing Restrictions: No      Mobility  Bed Mobility Overal bed mobility: Modified Independent                  Transfers Overall transfer level: Needs assistance Equipment used: None Transfers: Sit to/from Stand Sit to Stand: Min assist                Ambulation/Gait Ambulation/Gait assistance: Min guard Gait Distance (Feet): 35 Feet Assistive device: IV Pole Gait Pattern/deviations: Step-through pattern, Decreased step length - right, Decreased step length - left, Decreased weight shift to left Gait velocity: decr     General Gait Details: patient reports pain in back of L leg, also some sob with mobility. RR up to 30s  Stairs            Wheelchair Mobility    Modified Rankin (Stroke Patients Only)       Balance Overall balance assessment: Needs assistance Sitting-balance support: Feet supported Sitting balance-Leahy  Scale: Good     Standing balance support: Single extremity supported, During functional activity, Reliant on assistive device for balance Standing balance-Leahy Scale: Fair                               Pertinent Vitals/Pain Pain Assessment Pain Assessment: Faces Faces Pain Scale: Hurts little more Pain Location: L LE Pain  Descriptors / Indicators: Discomfort, Tightness, Sore Pain Intervention(s): Monitored during session    Home Living Family/patient expects to be discharged to:: Private residence Living Arrangements: Alone Available Help at Discharge: Family;Available 24 hours/day (patient just moved to independent living at [facility]) Type of Home: House Home Access: Level entry       Home Layout: One level Home Equipment: Cane - single point Additional Comments: was not using AD prior to this leg pain, has cane    Prior Function Prior Level of Function : Independent/Modified Independent;Driving             Mobility Comments: using SPC prior to admission, nothing before that, drives ADLs Comments: independent     Hand Dominance        Extremity/Trunk Assessment   Upper Extremity Assessment Upper Extremity Assessment: Overall WFL for tasks assessed    Lower Extremity Assessment Lower Extremity Assessment: LLE deficits/detail LLE: Unable to fully assess due to pain LLE Coordination: decreased gross motor    Cervical / Trunk Assessment Cervical / Trunk Assessment: Normal  Communication   Communication: HOH  Cognition Arousal/Alertness: Awake/alert Behavior During Therapy: WFL for tasks assessed/performed Overall Cognitive Status: Within Functional Limits for tasks assessed                                          General Comments      Exercises     Assessment/Plan    PT Assessment Patient needs continued PT services  PT Problem List Decreased strength;Decreased activity tolerance;Decreased balance;Decreased mobility;Pain;Cardiopulmonary status limiting activity;Decreased knowledge of use of DME       PT Treatment Interventions DME instruction;Therapeutic exercise;Balance training;Gait training;Functional mobility training;Therapeutic activities;Patient/family education;Stair training    PT Goals (Current goals can be found in the Care Plan section)   Acute Rehab PT Goals Patient Stated Goal: to return to Independent living with family assist and HHPT PT Goal Formulation: With patient/family Time For Goal Achievement: 04/21/22 Potential to Achieve Goals: Good    Frequency Min 2X/week     Co-evaluation               AM-PAC PT "6 Clicks" Mobility  Outcome Measure Help needed turning from your back to your side while in a flat bed without using bedrails?: None Help needed moving from lying on your back to sitting on the side of a flat bed without using bedrails?: None Help needed moving to and from a bed to a chair (including a wheelchair)?: A Little Help needed standing up from a chair using your arms (e.g., wheelchair or bedside chair)?: A Little Help needed to walk in hospital room?: A Little Help needed climbing 3-5 steps with a railing? : A Lot 6 Click Score: 19    End of Session   Activity Tolerance: Patient limited by pain Patient left: in chair;with call bell/phone within reach;with family/visitor present Nurse Communication: Mobility status PT Visit Diagnosis: Unsteadiness on feet (R26.81);Other abnormalities of gait and  mobility (R26.89);Muscle weakness (generalized) (M62.81);Pain;Difficulty in walking, not elsewhere classified (R26.2);History of falling (Z91.81) Pain - Right/Left: Left Pain - part of body: Leg    Time: 1445-1506 PT Time Calculation (min) (ACUTE ONLY): 21 min   Charges:   PT Evaluation $PT Eval Moderate Complexity: 1 Mod PT Treatments $Gait Training: 8-22 mins        [employer], PT, [employer] 04/13/22,3:22 PM

## 2022-04-13 NOTE — Progress Notes (Signed)
PT Cancellation Note  Patient Details Name: Curtis Wagner MRN: [id number] DOB: 10/24/1940   Cancelled Treatment:    Reason Eval/Treat Not Completed: Patient at procedure or test/unavailable Patient off unit. Will re-attempt when appropriate.  Wells Mabe 04/13/2022, 9:01 AM

## 2022-04-13 NOTE — Progress Notes (Signed)
OT Cancellation Note  Patient Details Name: Curtis Wagner MRN: [id number] DOB: 1940-08-18   Cancelled Treatment:    Reason Eval/Treat Not Completed: Patient at procedure or test/ unavailable. OT order received and chart reviewed. OT will re-attempt when pt is available.   Darleen Crocker, MS, OTR/L , CBIS ascom [phone]  04/13/22, 9:05 AM

## 2022-04-13 NOTE — Progress Notes (Addendum)
  Progress Note   Patient: Curtis Wagner [id number] DOB: 09-07-1940 DOA: 04/11/2022     1 DOS: the patient was seen and examined on 04/13/2022   Brief hospital course:  Assessment and Plan: * Fall - Fall precautions - PT/OT    Orthostatic hypotension - PT/OT    Absent pedal pulses - s/p L lower extremity angiogram, mechanical thrombectomy, stent to the proximal L SFA and distal L SFA by vascular surgery (Dr. Lucky Cowboy) - Heart/cardiac diet  - Lipitor 80 mg PO daily  - Plavix 75 mg PO daily  - Xarelto 15 mg PO daily  - IV aggrastat infusion    Myalgia - PT/OT   - CK is wnl    Type 2 diabetes mellitus with stage 3b chronic kidney disease, without long-term current use of insulin (HCC) - Monitor as glucose is wnl at this time    History of Bilateral pulmonary embolism (HCC) - Xarelto 15 mg PO daily  - IV aggrastat as above    AKI (acute kidney injury) ([place]) - Cr at pt's baseline    Hypertension - Hold BP meds due to orthostasis    PAF (paroxysmal atrial fibrillation) (HCC) - Xarelto 15 mg PO daily  - IV aggrastat as above    DVT prophylaxis: IV aggrastat & xarelto 15 mg PO daily       Subjective: Pt seen and examined at the bedside. Vascular surgery took the pt to the OR today. Pt now s/p L lower extremity angiogram, mechanical thrombectomy, stent to the proximal L SFA and distal L SFA. Pt now on IV aggrastat drip. Appreciate vascular surgery following along.  UPDATE: Spoke with pt's daughter and pt at the bedside. They advised the pt already has a DNR at home. Thus, DNR paperwork was filed and signed. DNR order placed into EPIC.  Physical Exam: Vitals:   04/13/22 1100 04/13/22 1115 04/13/22 1200 04/13/22 1300  BP:  120/76 114/70 (!) 131/94  Pulse: 82 76 79 88  Resp: 18 15 14 16   Temp:      TempSrc:      SpO2: 98% 99% 99% 97%  Weight:      Height:       Constitutional:      Appearance: Normal appearance.  HENT:     Head: Normocephalic.     Mouth/Throat:      Mouth: Mucous membranes are moist.  Cardiovascular:     Rate and Rhythm: Normal rate and regular rhythm.  Pulmonary:     Effort: Pulmonary effort is normal.  Abdominal:     General: Abdomen is flat.  Musculoskeletal:     Cervical back: Neck supple.     Comments: Decreased L leg movement   Skin:    Comments: L Lower leg w/ improved color  Neurological:     Mental Status: He is alert and oriented to person, place, and time.  Psychiatric:        Mood and Affect: Mood normal.   Data Reviewed:   Disposition: Status is: Inpatient  Planned Discharge Destination: Barriers to discharge: IV aggrastat drip and PT/OT     Time spent: 35 minutes  Author: Lucienne Minks , MD 04/13/2022 1:51 PM  For on call review [email or web address].

## 2022-04-13 NOTE — Op Note (Signed)
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  Left lower extremity angiogram through existing catheters             2.  Mechanical thrombectomy to the left profunda femoris artery with the penumbra CAT 7D catheter             3.  Stent placement to the proximal left SFA with 8 mm diameter by 5 cm length Viabahn stent             4.  Stent placement to the distal left SFA and the majority of the popliteal artery with 7 mm diameter by 10 cm length and 8 mm diameter by 10 cm length Viabahn stents             5.  StarClose closure device right femoral artery  EBL: 75 cc  Contrast: 50 cc  Fluoro Time: 3.6 minutes  Moderate Conscious Sedation Time: approximately 37 minutes using 1.5 mg of Versed and 75 mcg of Fentanyl              Indications:  Patient is a 82 y.o.male with thrombosis and rest pain of the left lower extremity. The patient has had overnight thrombolytic therapy and is brought back for repeat angiography.  The patient is brought in for angiography for further evaluation and potential treatment.  Due to the limb threatening nature of the situation, angiogram was performed for attempted limb salvage. The patient is aware that if the procedure fails, amputation would be expected.  The patient also understands that even with successful revascularization, amputation may still be required due to the severity of the situation.  Risks and benefits are discussed and informed consent is obtained.   Procedure:  The patient was identified and appropriate procedural time out was performed.  The patient was then placed supine on the table and prepped and draped in the usual sterile fashion. Moderate  conscious sedation was administered during a face to face encounter with the patient throughout the procedure with my supervision of the RN administering medicines and monitoring the patient's vital signs, pulse oximetry, telemetry and mental status throughout from the start of the procedure until the patient was taken to the recovery room.  The existing thrombolytic catheter was removed with placement of a V18 wire distally.  Selective left lower extremity angiogram was then performed. This demonstrated that the SFA and popliteal arteries now had flow.  There were 3 areas of residual thrombus creating flow limitation.  The first was a short segment over 2 to 3 cm in the proximal SFA.  The next 2 were within reasonably close proximity at [facility] canal and the above-knee popliteal artery and then the below-knee popliteal artery.  The profunda femoris artery continued to have occlusion from thrombus.  There was three-vessel runoff distally. It was felt that it was in the patient's best interest to proceed with intervention after these images.  I elected to primarily stent the areas of residual flow-limiting thrombus.  The proximal SFA thrombus which was short segment was treated with an 8 mm diameter by 5 cm length Viabahn stent.  The 2 distal lesions were treated with a 7 mm diameter by 10 cm  length Viabahn stent down to the distal popliteal artery and up to the mid popliteal artery.  A second stent was required and an 8 mm diameter by 10 cm length Viabahn stent was deployed from the most distal SFA down to the previously placed stent with about 1 to 2 cm overlap.  The stents had less than 10% residual stenosis after placement with good wall apposition so they were not postdilated.  I then used the V18 wire and a Kumpe catheter to cannulate the occluded profunda femoris artery.  Mechanical thrombectomy was performed with the penumbra CAT 7D catheter to try to open a channel in the profunda femoris artery.  I did not  want to use the bolt or other catheters in fear that some of this thrombus would be sent down the SFA.  Mechanical thrombectomy was performed with 2 passes with the CAT 70 catheter.  A small channel was opened and the initial lateral branch of the profunda femoris artery had good flow although it remained more occlusive distally.  I felt at this point, no attempt at ballooning or other treatments would be prudent as it may harm the recently revascularized SFA.  I elected to terminate the procedure. The sheath was removed and StarClose closure device was deployed in the right femoral artery with excellent hemostatic result. The patient was taken to the recovery room in stable condition having tolerated the procedure well.  Findings:                            Left Lower Extremity: The SFA and popliteal arteries now had flow.  There were 3 areas of residual thrombus creating flow limitation.  The first was a short segment over 2 to 3 cm in the proximal SFA.  The next 2 were within reasonably close proximity at [facility] canal and the above-knee popliteal artery and then the below-knee popliteal artery.  The profunda femoris artery continued to have occlusion from thrombus.  There was three-vessel runoff distally.   Disposition: Patient was taken to the recovery room in stable condition having tolerated the procedure well.  Complications: None  Leotis Pain 04/13/2022 8:23 AM   This note was created with Dragon Medical transcription system. Any errors in dictation are purely unintentional.

## 2022-04-13 NOTE — Interval H&P Note (Signed)
History and Physical Interval Note:  04/13/2022 7:44 AM  Curtis Wagner  has presented today for surgery, with the diagnosis of Left leg lysis.  The various methods of treatment have been discussed with the patient and family. After consideration of risks, benefits and other options for treatment, the patient has consented to  Procedure(s): LOWER EXTREMITY INTERVENTION (Left) as a surgical intervention.  The patient's history has been reviewed, patient examined, no change in status, stable for surgery.  I have reviewed the patient's chart and labs.  Questions were answered to the patient's satisfaction.     Leotis Pain

## 2022-04-14 ENCOUNTER — Encounter: Payer: Self-pay | Admitting: Vascular Surgery

## 2022-04-14 ENCOUNTER — Inpatient Hospital Stay (HOSPITAL_COMMUNITY): Payer: Medicare PPO

## 2022-04-14 DIAGNOSIS — Z7901 Long term (current) use of anticoagulants: Secondary | ICD-10-CM

## 2022-04-14 DIAGNOSIS — I4901 Ventricular fibrillation: Secondary | ICD-10-CM

## 2022-04-14 DIAGNOSIS — W19XXXA Unspecified fall, initial encounter: Secondary | ICD-10-CM | POA: Diagnosis not present

## 2022-04-14 DIAGNOSIS — Z9889 Other specified postprocedural states: Secondary | ICD-10-CM

## 2022-04-14 DIAGNOSIS — I25118 Atherosclerotic heart disease of native coronary artery with other forms of angina pectoris: Secondary | ICD-10-CM | POA: Diagnosis not present

## 2022-04-14 DIAGNOSIS — I743 Embolism and thrombosis of arteries of the lower extremities: Secondary | ICD-10-CM | POA: Diagnosis not present

## 2022-04-14 DIAGNOSIS — I2699 Other pulmonary embolism without acute cor pulmonale: Secondary | ICD-10-CM

## 2022-04-14 DIAGNOSIS — I70222 Atherosclerosis of native arteries of extremities with rest pain, left leg: Secondary | ICD-10-CM | POA: Diagnosis not present

## 2022-04-14 DIAGNOSIS — I420 Dilated cardiomyopathy: Secondary | ICD-10-CM

## 2022-04-14 DIAGNOSIS — I480 Paroxysmal atrial fibrillation: Secondary | ICD-10-CM | POA: Diagnosis not present

## 2022-04-14 DIAGNOSIS — Z95828 Presence of other vascular implants and grafts: Secondary | ICD-10-CM

## 2022-04-14 DIAGNOSIS — I951 Orthostatic hypotension: Secondary | ICD-10-CM | POA: Diagnosis not present

## 2022-04-14 LAB — C-REACTIVE PROTEIN: CRP: 6.3 mg/dL — ABNORMAL HIGH (ref <1.0)

## 2022-04-14 LAB — COMPREHENSIVE METABOLIC PANEL
ALT: 14 U/L (ref 0–44)
AST: 29 U/L (ref 15–41)
Albumin: 2.6 g/dL — ABNORMAL LOW (ref 3.5–5.0)
Alkaline Phosphatase: 106 U/L (ref 38–126)
Anion gap: 12 (ref 5–15)
BUN: 20 mg/dL (ref 8–23)
CO2: 21 mmol/L — ABNORMAL LOW (ref 22–32)
Calcium: 8.3 mg/dL — ABNORMAL LOW (ref 8.9–10.3)
Chloride: 105 mmol/L (ref 98–111)
Creatinine, Ser: 1.4 mg/dL — ABNORMAL HIGH (ref 0.61–1.24)
GFR, Estimated: 50 mL/min — ABNORMAL LOW (ref >60)
Glucose, Bld: 122 mg/dL — ABNORMAL HIGH (ref 70–99)
Potassium: 4.2 mmol/L (ref 3.5–5.1)
Sodium: 138 mmol/L (ref 135–145)
Total Bilirubin: 1.1 mg/dL (ref 0.3–1.2)
Total Protein: 5.4 g/dL — ABNORMAL LOW (ref 6.5–8.1)

## 2022-04-14 LAB — CBC
HCT: 38.3 % — ABNORMAL LOW (ref 39.0–52.0)
Hemoglobin: 12.6 g/dL — ABNORMAL LOW (ref 13.0–17.0)
MCH: 32.6 pg (ref 26.0–34.0)
MCHC: 32.9 g/dL (ref 30.0–36.0)
MCV: 99.2 fL (ref 80.0–100.0)
Platelets: 121 10*3/uL — ABNORMAL LOW (ref 150–400)
RBC: 3.86 MIL/uL — ABNORMAL LOW (ref 4.22–5.81)
RDW: 13.2 % (ref 11.5–15.5)
WBC: 5.2 10*3/uL (ref 4.0–10.5)
nRBC: 0 % (ref 0.0–0.2)

## 2022-04-14 LAB — CK: Total CK: 130 U/L (ref 49–397)

## 2022-04-14 LAB — MAGNESIUM: Magnesium: 1.8 mg/dL (ref 1.7–2.4)

## 2022-04-14 LAB — PHOSPHORUS: Phosphorus: 2.6 mg/dL (ref 2.5–4.6)

## 2022-04-14 MED ORDER — APIXABAN 5 MG PO TABS
5.0000 mg | ORAL_TABLET | Freq: Two times a day (BID) | ORAL | Status: DC
  Administered 2022-04-14 – 2022-04-17 (×6): 5 mg via ORAL
  Filled 2022-04-14 (×6): qty 1

## 2022-04-14 MED ORDER — AMIODARONE HCL 200 MG PO TABS
200.0000 mg | ORAL_TABLET | Freq: Every day | ORAL | Status: DC
  Administered 2022-04-14 – 2022-04-15 (×2): 200 mg via ORAL
  Filled 2022-04-14 (×2): qty 1

## 2022-04-14 MED ORDER — MAGNESIUM SULFATE 2 GM/50ML IV SOLN
2.0000 g | Freq: Once | INTRAVENOUS | Status: AC
  Administered 2022-04-14: 2 g via INTRAVENOUS
  Filled 2022-04-14: qty 50

## 2022-04-14 MED ORDER — METOPROLOL SUCCINATE ER 50 MG PO TB24
25.0000 mg | ORAL_TABLET | Freq: Every day | ORAL | Status: DC
  Administered 2022-04-14 – 2022-04-17 (×4): 25 mg via ORAL
  Filled 2022-04-14 (×4): qty 1

## 2022-04-14 MED ORDER — EMPAGLIFLOZIN 10 MG PO TABS
10.0000 mg | ORAL_TABLET | Freq: Every day | ORAL | Status: DC
  Administered 2022-04-14 – 2022-04-17 (×4): 10 mg via ORAL
  Filled 2022-04-14 (×4): qty 1

## 2022-04-14 MED ORDER — GADOBUTROL 1 MMOL/ML IV SOLN
11.0000 mL | Freq: Once | INTRAVENOUS | Status: AC | PRN
  Administered 2022-04-14: 11 mL via INTRAVENOUS

## 2022-04-14 NOTE — Progress Notes (Signed)
Per MD okay for patient to transport to MRI without RN.

## 2022-04-14 NOTE — Progress Notes (Signed)
OT Cancellation Note  Patient Details Name: Curtis Wagner MRN: [id number] DOB: 1940-09-19   Cancelled Treatment:    Reason Eval/Treat Not Completed: Patient at procedure or test/ unavailable. Attempted x2, AM pt completing PT session requesting return later. PM pt off unit for testing, will re-attempt next date.   Dessie Coma, M.S. OTR/L  04/14/22, 2:44 PM  ascom [phone]

## 2022-04-14 NOTE — Progress Notes (Signed)
Rounding Note    Patient Name: Curtis Wagner Date of Encounter: 04/14/2022  [place] Cardiologist: Followed at [facility]  Subjective   PV procedure x2 this admission with Dr. Lucky Wagner mechanical thrombectomy to the left profunda femoris artery, stent placement proximal left SFA, stent placement distal left SFA and majority of the popliteal artery Denies significant leg pain, numbness resolving  No family at the bedside to discuss medications and habits at home Previously reported forgetting some of his medications  Plan for MRI today to evaluate opacity in left atrium  Inpatient Medications    Scheduled Meds:  atorvastatin  80 mg Oral Daily   Chlorhexidine Gluconate Cloth  6 each Topical Daily   clopidogrel  75 mg Oral Daily   Rivaroxaban  15 mg Oral Q supper   sodium chloride flush  3 mL Intravenous Q12H   Continuous Infusions:   PRN Meds: acetaminophen **OR** acetaminophen, HYDROmorphone (DILAUDID) injection, ondansetron (ZOFRAN) IV, sodium chloride flush   Vital Signs    Vitals:   04/14/22 1030 04/14/22 1100 04/14/22 1200 04/14/22 1300  BP: 110/78 111/78 129/86 129/80  Pulse: 89 88 90 93  Resp: 17 18 (!) 21 (!) 24  Temp:      TempSrc:      SpO2: 97% 98% 100% 100%  Weight:      Height:        Intake/Output Summary (Last 24 hours) at 04/14/2022 1433 Last data filed at 04/14/2022 1141 Gross per 24 hour  Intake 384.87 ml  Output 850 ml  Net -465.13 ml      04/14/2022    1:20 AM 04/13/2022    1:55 AM 04/12/2022    6:10 PM  Last 3 Weights  Weight (lbs) 184 lb 1.4 oz 178 lb 12.7 oz 180 lb 1.9 oz  Weight (kg) 83.5 kg 81.1 kg 81.7 kg      Telemetry    Normal sinus rhythm- Personally Reviewed  ECG    - Personally Reviewed  Physical Exam   Constitutional:  oriented to person, place, and time. No distress.  HENT:  Head: Grossly normal Eyes:  no discharge. No scleral icterus.  Neck: No JVD, no carotid bruits  Cardiovascular: Regular rate and  rhythm, no murmurs appreciated Pulmonary/Chest: Clear to auscultation bilaterally, no wheezes or rails Abdominal: Soft.  no distension.  no tenderness.  Musculoskeletal: Normal range of motion Neurological:  normal muscle tone. Coordination normal. No atrophy Skin: Skin warm and dry Psychiatric: normal affect, pleasant  Labs    High Sensitivity Troponin:   Recent Labs  Lab 04/11/22 2048 04/11/22 2135  TROPONINIHS 20* 22*     Chemistry Recent Labs  Lab 04/12/22 0139 04/13/22 0452 04/14/22 0302  NA 135 132* 138  K 3.4* 3.4* 4.2  CL 104 106 105  CO2 22 21* 21*  GLUCOSE 105* 95 122*  BUN 16 17 20   CREATININE 1.34* 1.30* 1.40*  CALCIUM 7.9* 7.6* 8.3*  MG  --  1.9 1.8  PROT 5.9* 5.5* 5.4*  ALBUMIN 2.9* 2.8* 2.6*  AST 32 31 29  ALT 17 17 14   ALKPHOS 105 111 106  BILITOT 1.9* 2.0* 1.1  GFRNONAA 53* 54* 50*  ANIONGAP 9 5 12     Lipids No results for input(s): "CHOL", "TRIG", "HDL", "LABVLDL", "LDLCALC", "CHOLHDL" in the last 168 hours.  Hematology Recent Labs  Lab 04/13/22 0010 04/13/22 0452 04/14/22 0302  WBC 5.9 5.5 5.2  RBC 4.41 4.19* 3.86*  HGB 14.5 13.6 12.6*  HCT 43.7  41.6 38.3*  MCV 99.1 99.3 99.2  MCH 32.9 32.5 32.6  MCHC 33.2 32.7 32.9  RDW 13.2 13.3 13.2  PLT 143* 120* 121*   Thyroid  Recent Labs  Lab 04/11/22 2048  TSH 2.204    BNPNo results for input(s): "BNP", "PROBNP" in the last 168 hours.  DDimer  Recent Labs  Lab 04/12/22 0015  DDIMER 12.51*     Radiology    PERIPHERAL VASCULAR CATHETERIZATION  Result Date: 04/13/2022 See surgical note for result.  PERIPHERAL VASCULAR CATHETERIZATION  Result Date: 04/12/2022 See surgical note for result.   Cardiac Studies   Echo 1. Left ventricular ejection fraction, by estimation, is <20%. The left  ventricle has severely decreased function. The left ventricle demonstrates  global hypokinesis. The left ventricular internal cavity size was mildly  dilated. Left ventricular  diastolic  parameters are indeterminate.   2. Right ventricular systolic function is low normal. The right  ventricular size is normal. There is moderately elevated pulmonary artery  systolic pressure. The estimated right ventricular systolic pressure is  0000000 mmHg.   3. There is a 3 x 2.5 cm mass attached to the superior-posterior wall of  the left atrium. Differential includes tumor (myxoma) vs thrombus- less  likely. consider CMR for better characterization.. Left atrial size was  mildly dilated.   4. Right atrial size was moderately dilated.   5. The mitral valve is normal in structure. Mild mitral valve  regurgitation.   6. The aortic valve is tricuspid. Aortic valve regurgitation is mild.  Aortic valve sclerosis/calcification is present, without any evidence of  aortic stenosis.   7. The inferior vena cava is dilated in size with <50% respiratory  variability, suggesting right atrial pressure of 15 mmHg.   CT scan Filling defect along the posterior wall of the left atrium concerning for clot. This could be further evaluated with echo. Cardiomegaly.   Atherosclerotic irregularity throughout the aorta and iliac vessels.   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 Reconstitution of the trifurcation vessels below the left knee with dominant runoff via a diseased posterior tibial artery.   Mild to moderate disease throughout the right leg inflow and outflow vessels. Three-vessel runoff to the ankle with dominant flow via the posterior tibial artery.   NON-VASCULAR   Small bilateral pleural effusions.   Cholelithiasis.   Left hydronephrosis. Left ureter is dilated to the bladder. No visible obstructing stones.   Prostate enlargement.   Patient Profile     Curtis Wagner is a 82 year old male with history of coronary artery disease status post Impella assisted PCI to the left main in 2022,  right atrial thrombus, persistent atrial fibrillation on Xarelto, history of DVT with bilateral pulmonary embolisms 1 year ago due to noncompliance with anticoagulation, hypertension, hyperlipidemia, peripheral arterial disease and likely dementia currently admitted to [facility] with a several day history of left lower extremity numbness and pain found to be secondary to subacute thrombosis of the left SFA and profunda.   Assessment & Plan    Left lower extremity ischemia with pain Intervention over several days this admission with lysis, thrombectomy, stenting proximal left SFA, left distal  SFA, popliteal artery Touched base with Dr. Lucky Wagner concerning dosing Continue Plavix, change Xarelto 15 daily to Eliquis 5 twice daily Will encourage daughter to participate in efforts for medication compliance Lipitor 80 daily  Left atrial thrombus Noted on CT scan, opacity noted on surface echo  cardiac MRI today for further evaluation, rule out thrombus  Persistent atrial fibrillation Rate controlled, Xarelto 15 mg daily given creatinine clearance 22 Will change to Eliquis 5 twice daily, encourage patient medication compliance  Coronary artery disease, elevated troponin History of Impella supported DES to the left main as he was too high for CT surgery Previously referred to hospice but he got better On Plavix with Eliquis 5 twice daily as above Resume Lipitor  Cardiomyopathy Ejection fraction estimated less than 20% Difficult situation, given concern for medication noncompliance By his report , daughter sets up the medication, sometimes he forgets to take his medications.  Will need family help -Restart metoprolol succinate 25 daily, Jardiance 10 mg daily Will look to add spironolactone 25 mg tomorrow Further titration up on his metoprolol succinate as tolerated Overall appears euvolemic  History of bilateral PEs Prior history noncompliance with his Xarelto At that time was treated with high-dose  Xarelto March 2023 Eliquis 5 twice daily as above  Nonsustained VT Previously on amiodarone 200 daily per outpatient cardiology,  High risk of cardiac arrhythmia in the setting of EF less than 20% As outpatient takes metoprolol succinate 50 daily,  Will restart metoprolol succinate 25 daily given lower blood pressure Restart amiodarone 200 daily  Multiple falls Documented by primary care on recent visit April 11, 2022 Notes indicating 7 falls in the past 3 weeks Possibly related to PAD as detailed above  Protein calorie malnutrition, albumin 2.8 Will need to discuss nutrition with family   Total encounter time more than 50 minutes  Greater than 50% was spent in counseling and coordination of care with the patient   For questions or updates, please contact [place] Please consult [email or web address] for contact info under        Signed, Ida Rogue, MD  04/14/2022, 2:34 PM

## 2022-04-14 NOTE — Progress Notes (Signed)
  Progress Note   Patient: Curtis Wagner [id number] DOB: 12/08/1940 DOA: 04/11/2022     2 DOS: the patient was seen and examined on 04/14/2022   Brief hospital course:  Assessment and Plan: * Fall - Fall precautions - PT/OT    Orthostatic hypotension - PT/OT    Absent pedal pulses - s/p L lower extremity angiogram, mechanical thrombectomy, stent to the proximal L SFA and distal L SFA by vascular surgery (Dr. Lucky Cowboy) - Heart/cardiac diet  - Lipitor 80 mg PO daily  - Plavix 75 mg PO daily  - Xarelto 15 mg PO daily    Myalgia - PT/OT   - CK is wnl    Type 2 diabetes mellitus with stage 3b chronic kidney disease, without long-term current use of insulin (HCC) - Monitor as glucose is wnl at this time    History of Bilateral pulmonary embolism (HCC) - Xarelto 15 mg PO daily    AKI (acute kidney injury) ([place]) - Cr at pt's baseline    Hypertension - Hold BP meds due to orthostasis    PAF (paroxysmal atrial fibrillation) (HCC) - Xarelto 15 mg PO daily    DVT prophylaxis: Xarelto 15 mg PO daily       Subjective: Pt seen and examined at the bedside He is tolerating the xarelto well. L lower leg is warm and only slightly tender. Appreciate PT following along and working with the pt.  Physical Exam: Vitals:   04/14/22 0528 04/14/22 0600 04/14/22 0700 04/14/22 0800  BP:  115/80 124/80 124/77  Pulse: 95 94 89 95  Resp: 14 11 (!) 25 17  Temp:    98.1 F (36.7 C)  TempSrc:    Oral  SpO2: 92% 97% 95% 97%  Weight:      Height:       Constitutional:      Appearance: Normal appearance.  HENT:     Head: Normocephalic.     Mouth/Throat:     Mouth: Mucous membranes are moist.  Cardiovascular:     Rate and Rhythm: Normal rate and regular rhythm.  Pulmonary:     Effort: Pulmonary effort is normal.  Abdominal:     General: Abdomen is flat.  Musculoskeletal:     Cervical back: Neck supple.     Comments: Decreased L leg movement   Skin:    Comments: L Lower leg w/  improved color and temperature  Neurological:     Mental Status: He is alert and oriented to person, place, and time.  Psychiatric:        Mood and Affect: Mood normal.   Data Reviewed:   Disposition: Status is: Inpatient  Planned Discharge Destination: Home    Time spent: 35 minutes  Author: Lucienne Minks , MD 04/14/2022 9:13 AM  For on call review [email or web address].

## 2022-04-14 NOTE — Consult Note (Signed)
PHARMACY CONSULT NOTE - FOLLOW UP  Pharmacy Consult for Electrolyte Monitoring and Replacement   Recent Labs: Potassium (mmol/L)  Date Value  04/14/2022 4.2   Magnesium (mg/dL)  Date Value  04/14/2022 1.8   Calcium (mg/dL)  Date Value  04/14/2022 8.3 (L)   Albumin (g/dL)  Date Value  04/14/2022 2.6 (L)   Phosphorus (mg/dL)  Date Value  04/14/2022 2.6   Sodium (mmol/L)  Date Value  04/14/2022 138  06/16/2016 143     Assessment: 82 yo M with pertinent PMH HFrEF, pAF presents with limb ischemia now s/p thrombectomy. Pharmacy consulted to manage electrolytes while pt in CCU.  Goal of Therapy:  K >/= 4.0 and Mg >/= 2.0  Plan:  Magnesium sulfate 2 g IV x 1 Follow up electrolytes with AM labs  Delena Bali ,PharmD Clinical Pharmacist 04/14/2022 8:35 AM

## 2022-04-14 NOTE — Progress Notes (Signed)
[facility]  Daily Progress Note   Subjective  -   Patient not having significant pain but has not been up and walking on his left foot.  Objective Vitals:   04/14/22 1030 04/14/22 1100 04/14/22 1200 04/14/22 1300  BP: 110/78 111/78 129/86 129/80  Pulse: 89 88 90 93  Resp: 17 18 (!) 21 (!) 24  Temp:      TempSrc:      SpO2: 97% 98% 100% 100%  Weight:      Height:        Intake/Output Summary (Last 24 hours) at 04/14/2022 1532 Last data filed at 04/14/2022 1141 Gross per 24 hour  Intake 377.57 ml  Output 850 ml  Net -472.43 ml    PULM  CTAB CV  RRR VASC  1-2+ left lower extremity edema with some thick skin changes patchy throughout the foot.  The foot is warm and well-perfused otherwise.  Palpable dorsalis pedis pulse and weakly palpable posterior tibial pulse on the left  Laboratory CBC    Component Value Date/Time   WBC 5.2 04/14/2022 0302   HGB 12.6 (L) 04/14/2022 0302   HGB 15.4 06/16/2016 1538   HCT 38.3 (L) 04/14/2022 0302   HCT 44.5 06/16/2016 1538   PLT 121 (L) 04/14/2022 0302   PLT 236 06/16/2016 1538    BMET    Component Value Date/Time   NA 138 04/14/2022 0302   NA 143 06/16/2016 1538   K 4.2 04/14/2022 0302   CL 105 04/14/2022 0302   CO2 21 (L) 04/14/2022 0302   GLUCOSE 122 (H) 04/14/2022 0302   BUN 20 04/14/2022 0302   BUN 18 06/16/2016 1538   CREATININE 1.40 (H) 04/14/2022 0302   CALCIUM 8.3 (L) 04/14/2022 0302   GFRNONAA 50 (L) 04/14/2022 0302   GFRAA 62 06/16/2016 1538    Assessment/Planning: POD #1/2 s/p percutaneous revascularization with thrombolytic therapy and endovascular revascularization for an ischemic left lower extremity  Has a palpable pulse distally, but does still have some fixed skin changes within the foot and neurologic dysfunction. Okay to ambulate and transition to oral anticoagulation After discussions with Dr. Rockey Situ we will go with 5 mg twice daily of Eliquis due to renal dysfunction as well  as Plavix. PT and OT would be helpful at this point.  No activity limitations   Leotis Pain  04/14/2022, 3:32 PM

## 2022-04-14 NOTE — Progress Notes (Signed)
Physical Therapy Treatment Patient Details Name: Curtis Wagner MRN: [id number] DOB: 28-Aug-1940 Today's Date: 04/14/2022   History of Present Illness Curtis Wagner is an 82 y.o. male with falls. Pt also has complaints of left leg numbness and weakness.Reports dizziness worse with standing that occurs with standing and then he falls. Daughter at bedside is a Marine scientist. He recently moved in and independent living facility.  Extensive heart and vascular history. Also reports of myalgia. NO chest pain, No sob or palpitation.  Patient does not report any changes in vision or speech or nausea vomiting diarrhea abdominal pain.  No fevers chills no seizures.  No bleeding or melena. Patient is s/p  Left lower extremity angiogram through existing catheters 2.  Mechanical thrombectomy to the left profunda femoris artery with the penumbra CAT 7D catheter 3.  Stent placement to the proximal left SFA with 8 mm diameter by 5 cm length Viabahn stent  4.  Stent placement to the distal left SFA and the majority of the popliteal artery with 7 mm diameter by 10 cm length and 8 mm diameter by 10 cm length Viabahn stents   5.  StarClose closure device right femoral artery    PT Comments    Pt received upright in recliner agreeable to PT services. Pt requires min VC's for hand placement to stand to RW. Pt able to complete ~94' of gait with correct RW use with step lengths. Intermittent bouts of Vtach present on telemetry. Pt with improved tolerance for OOB mobility using RW for energy conservation, improved balance, and assist in offloading her LLE. Pt returns to recliner then reports need for BM. He then was able to SPT with RW with minguard to [facility] to pass BM. Pt left in care of RN. Continue to recommend f/u services as described below to optimize safety and mobility in home setting.     Recommendations for follow up therapy are one component of a multi-disciplinary discharge planning process, led by the attending physician.   Recommendations may be updated based on patient status, additional functional criteria and insurance authorization.  Follow Up Recommendations  Home health PT     Assistance Recommended at Discharge Frequent or constant Supervision/Assistance  Patient can return home with the following A little help with walking and/or transfers;A little help with bathing/dressing/bathroom;Assistance with cooking/housework;Assist for transportation;Direct supervision/assist for medications management;Help with stairs or ramp for entrance   Equipment Recommendations  Rolling Blomgren (2 wheels)    Recommendations for Other Services       Precautions / Restrictions Precautions Precautions: Fall Restrictions Weight Bearing Restrictions: No     Mobility  Bed Mobility               General bed mobility comments: NT. In recliner beginning of session. Patient Response: Cooperative  Transfers Overall transfer level: Needs assistance Equipment used: Rolling Benninger (2 wheels) Transfers: Sit to/from Stand Sit to Stand: Min guard           General transfer comment: VC's for hand placement.    Ambulation/Gait Ambulation/Gait assistance: Min guard Gait Distance (Feet): 94 Feet Assistive device: Rolling Daino (2 wheels) Gait Pattern/deviations: Step-through pattern, Decreased step length - right, Decreased step length - left, Decreased weight shift to left       General Gait Details: Improved gait tolerance using RW   Stairs             Wheelchair Mobility    Modified Rankin (Stroke Patients Only)       Balance  Overall balance assessment: Needs assistance Sitting-balance support: Feet supported Sitting balance-Leahy Scale: Good     Standing balance support: Bilateral upper extremity supported, During functional activity Standing balance-Leahy Scale: Fair                              Cognition                                                 Exercises Other Exercises Other Exercises: Benefits of RW for home use (i.e. energy conservation, reduced falls risk, offload painful LLE)    General Comments General comments (skin integrity, edema, etc.): Max HR up to 120's with Vtach. RN aware. SPO2 WNL during gait. Some desaturations identified but believe due to poor pleth waveform.      Pertinent Vitals/Pain Pain Assessment Pain Assessment: Faces Faces Pain Scale: Hurts little more Pain Location: L LE (calf specifically) Pain Descriptors / Indicators: Discomfort, Tightness, Sore Pain Intervention(s): Monitored during session    Home Living                          Prior Function            PT Goals (current goals can now be found in the care plan section) Acute Rehab PT Goals Patient Stated Goal: to return to Independent living with family assist and HHPT PT Goal Formulation: With patient/family Time For Goal Achievement: 04/21/22 Potential to Achieve Goals: Good Progress towards PT goals: Progressing toward goals    Frequency    Min 2X/week      PT Plan Current plan remains appropriate    Co-evaluation              AM-PAC PT "6 Clicks" Mobility   Outcome Measure  Help needed turning from your back to your side while in a flat bed without using bedrails?: None Help needed moving from lying on your back to sitting on the side of a flat bed without using bedrails?: None Help needed moving to and from a bed to a chair (including a wheelchair)?: A Little Help needed standing up from a chair using your arms (e.g., wheelchair or bedside chair)?: A Little Help needed to walk in hospital room?: A Little Help needed climbing 3-5 steps with a railing? : A Lot 6 Click Score: 19    End of Session Equipment Utilized During Treatment: Gait belt Activity Tolerance: Patient tolerated treatment well Patient left: with call bell/phone within reach;with nursing/sitter in room ([facility] Nurse  Communication: Mobility status PT Visit Diagnosis: Unsteadiness on feet (R26.81);Other abnormalities of gait and mobility (R26.89);Muscle weakness (generalized) (M62.81);Pain;Difficulty in walking, not elsewhere classified (R26.2);History of falling (Z91.81) Pain - Right/Left: Left Pain - part of body: Leg     Time: 1105-1130 PT Time Calculation (min) (ACUTE ONLY): 25 min  Charges:  $Therapeutic Exercise: 23-37 mins                     Bracha Frankowski M. Fairly IV, PT, DPT Physical Therapist- [facility]  04/14/2022, 1:25 PM

## 2022-04-15 DIAGNOSIS — I480 Paroxysmal atrial fibrillation: Secondary | ICD-10-CM | POA: Diagnosis not present

## 2022-04-15 DIAGNOSIS — W19XXXA Unspecified fall, initial encounter: Secondary | ICD-10-CM | POA: Diagnosis not present

## 2022-04-15 DIAGNOSIS — I70222 Atherosclerosis of native arteries of extremities with rest pain, left leg: Secondary | ICD-10-CM

## 2022-04-15 DIAGNOSIS — I743 Embolism and thrombosis of arteries of the lower extremities: Secondary | ICD-10-CM

## 2022-04-15 DIAGNOSIS — I951 Orthostatic hypotension: Secondary | ICD-10-CM | POA: Diagnosis not present

## 2022-04-15 DIAGNOSIS — I998 Other disorder of circulatory system: Secondary | ICD-10-CM | POA: Diagnosis not present

## 2022-04-15 DIAGNOSIS — I2699 Other pulmonary embolism without acute cor pulmonale: Secondary | ICD-10-CM | POA: Diagnosis not present

## 2022-04-15 LAB — CBC
HCT: 37 % — ABNORMAL LOW (ref 39.0–52.0)
Hemoglobin: 12.4 g/dL — ABNORMAL LOW (ref 13.0–17.0)
MCH: 32.8 pg (ref 26.0–34.0)
MCHC: 33.5 g/dL (ref 30.0–36.0)
MCV: 97.9 fL (ref 80.0–100.0)
Platelets: 115 10*3/uL — ABNORMAL LOW (ref 150–400)
RBC: 3.78 MIL/uL — ABNORMAL LOW (ref 4.22–5.81)
RDW: 13.2 % (ref 11.5–15.5)
WBC: 5.1 10*3/uL (ref 4.0–10.5)
nRBC: 0 % (ref 0.0–0.2)

## 2022-04-15 LAB — COMPREHENSIVE METABOLIC PANEL
ALT: 12 U/L (ref 0–44)
AST: 30 U/L (ref 15–41)
Albumin: 2.6 g/dL — ABNORMAL LOW (ref 3.5–5.0)
Alkaline Phosphatase: 104 U/L (ref 38–126)
Anion gap: 6 (ref 5–15)
BUN: 17 mg/dL (ref 8–23)
CO2: 21 mmol/L — ABNORMAL LOW (ref 22–32)
Calcium: 7.8 mg/dL — ABNORMAL LOW (ref 8.9–10.3)
Chloride: 106 mmol/L (ref 98–111)
Creatinine, Ser: 1.31 mg/dL — ABNORMAL HIGH (ref 0.61–1.24)
GFR, Estimated: 54 mL/min — ABNORMAL LOW (ref >60)
Glucose, Bld: 128 mg/dL — ABNORMAL HIGH (ref 70–99)
Potassium: 3.3 mmol/L — ABNORMAL LOW (ref 3.5–5.1)
Sodium: 133 mmol/L — ABNORMAL LOW (ref 135–145)
Total Bilirubin: 1.1 mg/dL (ref 0.3–1.2)
Total Protein: 5.3 g/dL — ABNORMAL LOW (ref 6.5–8.1)

## 2022-04-15 LAB — MAGNESIUM: Magnesium: 2.1 mg/dL (ref 1.7–2.4)

## 2022-04-15 LAB — C-REACTIVE PROTEIN: CRP: 5.7 mg/dL — ABNORMAL HIGH (ref <1.0)

## 2022-04-15 LAB — PHOSPHORUS: Phosphorus: 2.6 mg/dL (ref 2.5–4.6)

## 2022-04-15 LAB — CK: Total CK: 68 U/L (ref 49–397)

## 2022-04-15 MED ORDER — OXYCODONE-ACETAMINOPHEN 5-325 MG PO TABS
1.0000 | ORAL_TABLET | Freq: Four times a day (QID) | ORAL | Status: DC | PRN
  Administered 2022-04-15 – 2022-04-16 (×3): 1 via ORAL
  Filled 2022-04-15 (×3): qty 1

## 2022-04-15 MED ORDER — POTASSIUM CHLORIDE CRYS ER 20 MEQ PO TBCR
40.0000 meq | EXTENDED_RELEASE_TABLET | Freq: Once | ORAL | Status: AC
  Administered 2022-04-15: 40 meq via ORAL
  Filled 2022-04-15: qty 2

## 2022-04-15 MED ORDER — FUROSEMIDE 10 MG/ML IJ SOLN
20.0000 mg | Freq: Two times a day (BID) | INTRAMUSCULAR | Status: AC
  Administered 2022-04-15 (×2): 20 mg via INTRAVENOUS
  Filled 2022-04-15 (×2): qty 2

## 2022-04-15 MED ORDER — AMIODARONE HCL 200 MG PO TABS
200.0000 mg | ORAL_TABLET | Freq: Two times a day (BID) | ORAL | Status: DC
  Administered 2022-04-15 – 2022-04-17 (×4): 200 mg via ORAL
  Filled 2022-04-15 (×4): qty 1

## 2022-04-15 NOTE — Progress Notes (Signed)
Rounding Note    Patient Name: Curtis Wagner Date of Encounter: 04/15/2022  [facility] Cardiologist: Followed at [facility]  Subjective   No complaints, sitting in recliner Reports no significant leg pain," legs are pink" Daughter at the bedside Discussed recent procedures PV procedure x2 this admission with Dr. Lucky Cowboy mechanical thrombectomy to the left profunda femoris artery, stent placement proximal left SFA, stent placement distal left SFA and majority of the popliteal artery Denies significant leg pain, numbness resolving  MRI yesterday confirming left atrial thrombus Yesterday transitioned from Xarelto 15 mg daily to Eliquis 5 twice daily  Inpatient Medications    Scheduled Meds:  amiodarone  200 mg Oral Daily   apixaban  5 mg Oral BID   atorvastatin  80 mg Oral Daily   Chlorhexidine Gluconate Cloth  6 each Topical Daily   clopidogrel  75 mg Oral Daily   empagliflozin  10 mg Oral Daily   furosemide  20 mg Intravenous BID   metoprolol succinate  25 mg Oral Daily   sodium chloride flush  3 mL Intravenous Q12H   Continuous Infusions:   PRN Meds: acetaminophen **OR** acetaminophen, HYDROmorphone (DILAUDID) injection, ondansetron (ZOFRAN) IV, oxyCODONE-acetaminophen, sodium chloride flush   Vital Signs    Vitals:   04/15/22 1200 04/15/22 1300 04/15/22 1400 04/15/22 1600  BP: 113/71 96/68 112/70 109/85  Pulse:      Resp: (!) 21 (!) 24 13 (!) 28  Temp: (!) 97.5 F (36.4 C)   (!) 97.2 F (36.2 C)  TempSrc: Oral   Oral  SpO2: 97%   98%  Weight:      Height:        Intake/Output Summary (Last 24 hours) at 04/15/2022 1700 Last data filed at 04/15/2022 1637 Gross per 24 hour  Intake 1245 ml  Output 1850 ml  Net -605 ml      04/15/2022    4:00 AM 04/14/2022    1:20 AM 04/13/2022    1:55 AM  Last 3 Weights  Weight (lbs) 184 lb 8.4 oz 184 lb 1.4 oz 178 lb 12.7 oz  Weight (kg) 83.7 kg 83.5 kg 81.1 kg      Telemetry    Atrial fibrillation-  Personally Reviewed  ECG    - Personally Reviewed  Physical Exam   Constitutional:  oriented to person, place, and time. No distress.  HENT:  Head: Grossly normal Eyes:  no discharge. No scleral icterus.  Neck: No JVD, no carotid bruits  Cardiovascular: Irregularly irregular, no murmurs appreciated Pulmonary/Chest: Clear to auscultation bilaterally, no wheezes or rails Abdominal: Soft.  no distension.  no tenderness.  Musculoskeletal: Normal range of motion Neurological:  normal muscle tone. Coordination normal. No atrophy Skin: Skin warm and dry Psychiatric: normal affect, pleasant  Labs    High Sensitivity Troponin:   Recent Labs  Lab 04/11/22 2048 04/11/22 2135  TROPONINIHS 20* 22*     Chemistry Recent Labs  Lab 04/13/22 0452 04/14/22 0302 04/15/22 0402  NA 132* 138 133*  K 3.4* 4.2 3.3*  CL 106 105 106  CO2 21* 21* 21*  GLUCOSE 95 122* 128*  BUN 17 20 17   CREATININE 1.30* 1.40* 1.31*  CALCIUM 7.6* 8.3* 7.8*  MG 1.9 1.8 2.1  PROT 5.5* 5.4* 5.3*  ALBUMIN 2.8* 2.6* 2.6*  AST 31 29 30   ALT 17 14 12   ALKPHOS 111 106 104  BILITOT 2.0* 1.1 1.1  GFRNONAA 54* 50* 54*  ANIONGAP 5 12 6     Lipids  No results for input(s): "CHOL", "TRIG", "HDL", "LABVLDL", "LDLCALC", "CHOLHDL" in the last 168 hours.  Hematology Recent Labs  Lab 04/13/22 0452 04/14/22 0302 04/15/22 0402  WBC 5.5 5.2 5.1  RBC 4.19* 3.86* 3.78*  HGB 13.6 12.6* 12.4*  HCT 41.6 38.3* 37.0*  MCV 99.3 99.2 97.9  MCH 32.5 32.6 32.8  MCHC 32.7 32.9 33.5  RDW 13.3 13.2 13.2  PLT 120* 121* 115*   Thyroid  Recent Labs  Lab 04/11/22 2048  TSH 2.204    BNPNo results for input(s): "BNP", "PROBNP" in the last 168 hours.  DDimer  Recent Labs  Lab 04/12/22 0015  DDIMER 12.51*     Radiology    MR CARDIAC MORPHOLOGY W WO CONTRAST  Result Date: 04/14/2022 CLINICAL DATA:  LA mass, cardiomyopathy EXAM: CARDIAC MRI TECHNIQUE: The patient was scanned on a 1.5 Tesla Siemens magnet. A dedicated  cardiac coil was used. Functional imaging was done using Fiesta sequences. 2,3, and 4 chamber views were done to assess for RWMA's. Modified Simpson's rule using a short axis stack was used to calculate an ejection fraction on a dedicated work Conservation officer, nature. The patient received 11 cc of Gadavist. After 10 minutes inversion recovery sequences were used to assess for infiltration and scar tissue. Velocity flow mapping performed in the ascending aorta and main pulmonary artery. CONTRAST:  11 cc  of Gadavist FINDINGS: 1. Moderately dilated LV size, severely reduced systolic function (LVEF = 18%). Normal LV wall thickness. There is global hypokinesis with no regional wall motion abnormalities. There is no late gadolinium enhancement in the left ventricular myocardium. Mildly elevated LV native T1 value of 1046 ms (normal <1000) LV ECV 32% (normal <30%) LVEDV: 291 ml LVESV: 240 ml SV: 51 ml CO: 4 L/min Myocardial mass: 150 g 2. Normal right ventricular size and thickness. Mildly reduced RV systolic function (RVEF = 39%). 3. Severely dilated left atrium, mildly dilated right atrium. There is a 31 x 22 mm mass in the superior and posterior aspect of the left atrium most consistent with a thrombus. 4. Normal size of the aortic root, ascending aorta and pulmonary artery. 5.  Mild MR, mild AI. 6.  Normal pericardium.  No pericardial effusion. IMPRESSION: 1. Moderately dilated LV, severely reduced LV systolic function. LVEF 18%. 2. There is a 31 x 22 mm mass in the superior and posterior aspect of the left atrium most consistent with a thrombus. 3. There is no late gadolinium enhancement in the left ventricular myocardium. 4. There is no evidence for myocardial infiltration. 5. Mildly reduced RV function. 6. Findings consistent with dilated nonischemic cardiomyopathy. Electronically Signed   By: Kate Sable M.D.   On: 04/14/2022 17:53   MR CARDIAC VELOCITY FLOW MAP  Result Date: 04/14/2022 CLINICAL  DATA:  LA mass, cardiomyopathy EXAM: CARDIAC MRI TECHNIQUE: The patient was scanned on a 1.5 Tesla Siemens magnet. A dedicated cardiac coil was used. Functional imaging was done using Fiesta sequences. 2,3, and 4 chamber views were done to assess for RWMA's. Modified Simpson's rule using a short axis stack was used to calculate an ejection fraction on a dedicated work Conservation officer, nature. The patient received 11 cc of Gadavist. After 10 minutes inversion recovery sequences were used to assess for infiltration and scar tissue. Velocity flow mapping performed in the ascending aorta and main pulmonary artery. CONTRAST:  11 cc  of Gadavist FINDINGS: 1. Moderately dilated LV size, severely reduced systolic function (LVEF = 18%). Normal LV wall thickness.  There is global hypokinesis with no regional wall motion abnormalities. There is no late gadolinium enhancement in the left ventricular myocardium. Mildly elevated LV native T1 value of 1046 ms (normal <1000) LV ECV 32% (normal <30%) LVEDV: 291 ml LVESV: 240 ml SV: 51 ml CO: 4 L/min Myocardial mass: 150 g 2. Normal right ventricular size and thickness. Mildly reduced RV systolic function (RVEF = 39%). 3. Severely dilated left atrium, mildly dilated right atrium. There is a 31 x 22 mm mass in the superior and posterior aspect of the left atrium most consistent with a thrombus. 4. Normal size of the aortic root, ascending aorta and pulmonary artery. 5.  Mild MR, mild AI. 6.  Normal pericardium.  No pericardial effusion. IMPRESSION: 1. Moderately dilated LV, severely reduced LV systolic function. LVEF 18%. 2. There is a 31 x 22 mm mass in the superior and posterior aspect of the left atrium most consistent with a thrombus. 3. There is no late gadolinium enhancement in the left ventricular myocardium. 4. There is no evidence for myocardial infiltration. 5. Mildly reduced RV function. 6. Findings consistent with dilated nonischemic cardiomyopathy. Electronically  Signed   By: Kate Sable M.D.   On: 04/14/2022 17:53    Cardiac Studies   Echo 1. Left ventricular ejection fraction, by estimation, is <20%. The left  ventricle has severely decreased function. The left ventricle demonstrates  global hypokinesis. The left ventricular internal cavity size was mildly  dilated. Left ventricular  diastolic parameters are indeterminate.   2. Right ventricular systolic function is low normal. The right  ventricular size is normal. There is moderately elevated pulmonary artery  systolic pressure. The estimated right ventricular systolic pressure is  0000000 mmHg.   3. There is a 3 x 2.5 cm mass attached to the superior-posterior wall of  the left atrium. Differential includes tumor (myxoma) vs thrombus- less  likely. consider CMR for better characterization.. Left atrial size was  mildly dilated.   4. Right atrial size was moderately dilated.   5. The mitral valve is normal in structure. Mild mitral valve  regurgitation.   6. The aortic valve is tricuspid. Aortic valve regurgitation is mild.  Aortic valve sclerosis/calcification is present, without any evidence of  aortic stenosis.   7. The inferior vena cava is dilated in size with <50% respiratory  variability, suggesting right atrial pressure of 15 mmHg.   CT scan Filling defect along the posterior wall of the left atrium concerning for clot. This could be further evaluated with echo. Cardiomegaly.   Atherosclerotic irregularity throughout the aorta and iliac vessels.   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 Reconstitution of the trifurcation vessels below the left knee with dominant runoff via a diseased posterior tibial artery.   Mild to moderate disease throughout the right leg inflow and outflow vessels. Three-vessel runoff to the ankle with dominant flow via the posterior tibial artery.    NON-VASCULAR   Small bilateral pleural effusions.   Cholelithiasis.   Left hydronephrosis. Left ureter is dilated to the bladder. No visible obstructing stones.   Prostate enlargement.   Patient Profile     Curtis Wagner is a 82 year old male with history of coronary artery disease status post Impella assisted PCI to the left main in 2022, right atrial thrombus, persistent atrial fibrillation on Xarelto, history of DVT with bilateral pulmonary embolisms 1 year ago due to noncompliance with anticoagulation, hypertension, hyperlipidemia, peripheral arterial  disease and likely dementia [facility] a several day history of left lower extremity numbness and pain found to be secondary to subacute thrombosis of the left SFA and profunda.   Assessment & Plan    Left lower extremity ischemia with pain Intervention over several days this admission with lysis, thrombectomy, stenting proximal left SFA, left distal  SFA, popliteal artery Touched base with Dr. Lucky Cowboy concerning dosing Continue Plavix with Eliquis 5 twice daily Lipitor 80 daily  Left atrial thrombus Noted on CT scan, opacity noted on surface echo cardiac MRI confirming thrombus On Eliquis 5 twice daily Decision made not to transition to warfarin as he lives alone, transportation issues, prior history of medication compliance issues  Persistent atrial fibrillation Rate controlled, continue Eliquis 5 twice daily,   Cardiomyopathy In the setting of cardiomyopathy EF 20% Will continue metoprolol succinate 25 daily, Jardiance 10 Blood pressure low limiting restart of spironolactone  Nonsustained VT Restart amiodarone 200 twice daily given nonsustained VT on telemetry Continue metoprolol succinate 25  Coronary artery disease, elevated troponin History of Impella supported DES to the left main as he was too high for CT surgery Previously referred to hospice but he got better On Plavix with Eliquis 5 twice daily  as above Resume Lipitor  History of bilateral PEs Prior history noncompliance with his Xarelto At that time was treated with high-dose Xarelto March 2023 Stressed importance of compliance with his Eliquis 5 twice daily   Multiple falls Documented by primary care on recent visit April 11, 2022 Notes indicating 7 falls in the past 3 weeks Possibly related to PAD as detailed above  Protein calorie malnutrition,  albumin 2.8 Lives alone, family lives nearby, can help with nutrition  Case discussed with patient and daughter at the bedside  Total encounter time more than 50 minutes  Greater than 50% was spent in counseling and coordination of care with the patient   For questions or updates, please contact [place] Please consult [email or web address] for contact info under        Signed, Ida Rogue, MD  04/15/2022, 5:00 PM

## 2022-04-15 NOTE — TOC Initial Note (Signed)
Transition of Care [facility]) - Initial/Assessment Note    Patient Details  Name: Curtis Wagner MRN: [id number] Date of Birth: December 05, 1940  Transition of Care [facility] CM/SW Contact:    Tiburcio Bash, LCSW Phone Number: 04/15/2022, 10:47 AM  Clinical Narrative:                  CSW met with patient at bedside to discuss [facility] services, he reports he feels he already has that. CSW inquired as to where he lives he states at home and is agreeable to [facility] and Therapist, sports.   Patient reports he does not remember his PCP or pharmacy preference, reports he lives in [place] and recently moved here from [place]. Patient reports at time of discharge his daughter in law or son in law could pick him up, although he typically drives.   Due to slight confusion during assessment TOC has reached out to patient's daughter listed in chart [name], no answer lvm.   Patient is set up with [facility] PT and RN services when he's medically stable to [email or web address] aware we are following up to identify patient's current PCP pending call back from daughter.   Expected Discharge Plan: [place] Barriers to Discharge: Continued Medical Work up   Patient Goals and CMS Choice Patient states their goals for this hospitalization and ongoing recovery are:: to go home CMS Medicare.gov Compare Post Acute Care list provided to:: Patient Choice offered to / list presented to : Patient      Expected Discharge Plan and Services       Living arrangements for the past 2 months: Single Family Home                           HH Arranged: PT, RN [facility] Agency: [place] Date [facility] Agency Contacted: 04/15/22 Time [place]: 77 Representative spoke with at [place]: Alvis Lemmings  Prior Living Arrangements/Services Living arrangements for the past 2 months: [place] with:: Self   Do you feel safe going back to the place where you live?: Yes               Activities of Daily  Living Home Assistive Devices/Equipment: Contact lenses ADL Screening (condition at time of admission) Patient's cognitive ability adequate to safely complete daily activities?: Yes Is the patient deaf or have difficulty hearing?: No Does the patient have difficulty seeing, even when wearing glasses/contacts?: No Does the patient have difficulty concentrating, remembering, or making decisions?: No Patient able to express need for assistance with ADLs?: Yes Does the patient have difficulty dressing or bathing?: No Independently performs ADLs?: No Does the patient have difficulty walking or climbing stairs?: Yes Weakness of Legs: Left Weakness of Arms/Hands: None  Permission Sought/Granted                  Emotional Assessment              Admission diagnosis:  Left leg pain [M79.605] Falls [W19.XXXA] Fall, initial encounter [id number]] Orthostatic dizziness [R42] Patient Active Problem List   Diagnosis Date Noted   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 with rest pain ([place]) 04/15/2022   Arterial embolism of left leg ([place]) 04/15/2022   Dilated cardiomyopathy ([place]) 04/14/2022   Ventricular fibrillation ([place]) 04/14/2022   Left leg pain 04/13/2022   Orthostatic dizziness 04/13/2022   Coronary artery disease of native artery of native heart with  stable angina pectoris ([place]) 04/13/2022   Fall 04/12/2022   Orthostatic hypotension 04/12/2022   Myalgia 04/12/2022   Absent pedal pulses 04/12/2022   PAD (peripheral artery disease) ([place]) 04/12/2022   Acute lower limb ischemia 04/12/2022   AKI (acute kidney injury) ([place]) 04/11/2022   Type 2 diabetes mellitus with stage 3b chronic kidney disease, without long-term current use of insulin ([place]) 04/01/2021   Bilateral pulmonary embolism ([place]) 03/30/2021   PAF (paroxysmal atrial fibrillation) ([place])    Hypertension    Hyperlipidemia    Arthritis of shoulder region, right 01/01/2015   PCP:  Patient, No Pcp  Per Pharmacy:   [facility] - [place], [place] - [address] [place] [address] Phone: [phone] Fax: [phone]     Social Determinants of Health (SDOH) Social History: SDOH Screenings   Food Insecurity: No Food Insecurity (04/12/2022)  Housing: Low Risk  (04/12/2022)  Transportation Needs: No Transportation Needs (04/12/2022)  Utilities: Not At Risk (04/12/2022)  Tobacco Use: Medium Risk (04/14/2022)   SDOH Interventions:     Readmission Risk Interventions     No data to display

## 2022-04-15 NOTE — Progress Notes (Signed)
  Progress Note   Patient: Curtis Wagner [id number] DOB: August 23, 1940 DOA: 04/11/2022     3 DOS: the patient was seen and examined on 04/15/2022   Brief hospital course:  Assessment and Plan: * Fall - Fall precautions - PT/OT    Orthostatic hypotension - PT/OT    Absent pedal pulses - s/p L lower extremity angiogram, mechanical thrombectomy, stent to the proximal L SFA and distal L SFA by vascular surgery (Dr. Lucky Cowboy) - Percocet 5-325 mg 1 tab PO q6 hr PRN  - Heart/cardiac diet  - Lipitor 80 mg PO daily  - Plavix 75 mg PO daily  - Eliquis 5 mg PO bid    Myalgia - PT/OT   - CK is wnl    Type 2 diabetes mellitus with stage 3b chronic kidney disease, without long-term current use of insulin (HCC) - Jardiance 10 mg PO daily  - Monitor glucose levels     History of Bilateral pulmonary embolism (HCC) - Eliquis 5 mg PO bid    AKI (acute kidney injury) (HCC) - Cr at pt's baseline    Hypertension - Toprol XL 25 mg PO daily    PAF (paroxysmal atrial fibrillation) (HCC) - Eliquis 5 mg PO bid  - Toprol XL 25 mg PO daily  - Amiodarone 200 mg PO daily    DVT prophylaxis: Eliquis 5 mg PO bid       Subjective: Pt seen and examined at the bedside. Clinically he is doing well. His L lower leg tenderness and pain is improved s/p vascular surgery intervention. IV lasix 20 mg q12 (2 doses total) ordered today to help with lower ext edema. Appreciate vascular surgery following along.  Physical Exam: Vitals:   04/15/22 0700 04/15/22 0823 04/15/22 0900 04/15/22 1000  BP: (!) 127/103 130/89 124/88 114/68  Pulse: 78 76 76 75  Resp: (!) 22 16 (!) 25 (!) 22  Temp:  97.7 F (36.5 C)    TempSrc:  Oral    SpO2: 92% 97% 99% 100%  Weight:      Height:       Constitutional:      Appearance: Normal appearance.  HENT:     Head: Normocephalic.     Mouth/Throat:     Mouth: Mucous membranes are moist.  Cardiovascular:     Rate and Rhythm: Normal rate and regular rhythm.  Pulmonary:      Effort: Pulmonary effort is normal.  Abdominal:     General: Abdomen is flat.  Musculoskeletal:     Cervical back: Neck supple.  Skin:    Comments: L Lower leg w/ normal color and temperature. 1+ pitting edema  Neurological:     Mental Status: He is alert and oriented to person, place, and time.  Psychiatric:        Mood and Affect: Mood normal.   Data Reviewed:   Disposition: Status is: Inpatient  Planned Discharge Destination: Barriers to discharge: Ambulation, PT/OT and recovery from stent placement by vascular surgery     Time spent: 35 minutes  Author: Lucienne Minks , MD 04/15/2022 10:38 AM  For on call review [email or web address].

## 2022-04-15 NOTE — Progress Notes (Deleted)
Unable to obtain pt weight due to bed error.  Cameron Ali, RN

## 2022-04-15 NOTE — Evaluation (Signed)
Occupational Therapy Evaluation Patient Details Name: Curtis Wagner MRN: [id number] DOB: 04-10-1940 Today's Date: 04/15/2022   History of Present Illness Curtis Wagner is an 82 y.o. male with falls. Pt also has complaints of left leg numbness and weakness.Reports dizziness worse with standing that occurs with standing and then he falls. Daughter at bedside is a Marine scientist. He recently moved in and independent living facility.  Extensive heart and vascular history. Also reports of myalgia. NO chest pain, No sob or palpitation.  Patient does not report any changes in vision or speech or nausea vomiting diarrhea abdominal pain.  No fevers chills no seizures.  No bleeding or melena. Patient is s/p  Left lower extremity angiogram through existing catheters 2.  Mechanical thrombectomy to the left profunda femoris artery with the penumbra CAT 7D catheter 3.  Stent placement to the proximal left SFA with 8 mm diameter by 5 cm length Viabahn stent  4.  Stent placement to the distal left SFA and the majority of the popliteal artery with 7 mm diameter by 10 cm length and 8 mm diameter by 10 cm length Viabahn stents   5.  StarClose closure device right femoral artery   Clinical Impression   Patient received for OT evaluation. See flowsheet below for details of function. Generally, patient MOD (I) for bed mobility, supervision with RW for functional mobility, and set up-MIN A for ADLs. Concern for cognition; would benefit from cognitive screen/evaluation.  Patient will benefit from continued OT while in acute care.       Recommendations for follow up therapy are one component of a multi-disciplinary discharge planning process, led by the attending physician.  Recommendations may be updated based on patient status, additional functional criteria and insurance authorization.   Follow Up Recommendations  Home health OT     Assistance Recommended at Discharge Intermittent Supervision/Assistance  Patient can return  home with the following A little help with walking and/or transfers;A little help with bathing/dressing/bathroom;Assistance with cooking/housework;Direct supervision/assist for medications management;Direct supervision/assist for financial management;Assist for transportation    Functional Status Assessment  Patient has had a recent decline in their functional status and demonstrates the ability to make significant improvements in function in a reasonable and predictable amount of time.  Equipment Recommendations  None recommended by OT    Recommendations for Other Services       Precautions / Restrictions Precautions Precautions: Fall Restrictions Weight Bearing Restrictions: No      Mobility Bed Mobility Overal bed mobility: Modified Independent                  Transfers Overall transfer level: Needs assistance Equipment used: Rolling Dugal (2 wheels) Transfers: Sit to/from Stand Sit to Stand: Supervision (and cues for hand placement)                  Balance Overall balance assessment: Needs assistance Sitting-balance support: Feet supported Sitting balance-Leahy Scale: Good     Standing balance support: Bilateral upper extremity supported, During functional activity Standing balance-Leahy Scale: Fair                             ADL either performed or assessed with clinical judgement   ADL Overall ADL's : Needs assistance/impaired                     Lower Body Dressing: Set up Lower Body Dressing Details (indicate cue type and reason): Pt demonstrating  ability to perform modified figure four position while seated with BIL LE elevated in chair with extra time. Anticipate pt will be able to perform LB dressing with set up.               General ADL Comments: Pt would benefit from supervision at d/c to ensure proper use of RW for mobility and during ADL performance. Unclear how much of therapy instruction pt is retaining.  Anticipate pt to be supervision/standby for ADLs at home with use of RW. Intermittent cues may be needed for safety.     Vision         Perception     Praxis      Pertinent Vitals/Pain Pain Assessment Pain Assessment: 0-10 Pain Score: 6  Pain Location: LLE Pain Descriptors / Indicators: Discomfort, Tightness, Sore Pain Intervention(s): Limited activity within patient's tolerance     Hand Dominance Right   Extremity/Trunk Assessment Upper Extremity Assessment Upper Extremity Assessment: Overall WFL for tasks assessed;LUE deficits/detail LUE Deficits / Details: Pt states he has only L shoulder injury. Demonstrates decreased shoulder flexion to approx 80 degrees today.   Lower Extremity Assessment Lower Extremity Assessment: LLE deficits/detail LLE Deficits / Details: s/p angiogram and stent       Communication Communication Communication: No difficulties   Cognition Arousal/Alertness: Awake/alert Behavior During Therapy: WFL for tasks assessed/performed Overall Cognitive Status: Within Functional Limits for tasks assessed                                 General Comments: Note in medical record indicating possible difficulty with medication management. Pt is pleasant; has difficulty with details such as when he started using a cane; stated he's used a cane since surgery (which according to PT notes he has been using a RW). 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 OT asked pt before he t/f sit to stand (had BIL hands on RW) whether PT taught him yesterday where he was supposed to put his hands; pt stated they did not (which is highly doubtful); concern for carryover of therapy teaching.     General Comments  Pt HR appearing steady during session today; some PVC's noted on monitor; RN just outside pt's room; O2  WNL. Pt denies shortness of breath.    Exercises     Shoulder Instructions      Home Living Family/patient expects to be discharged to:: Private residence Living Arrangements: Alone Available Help at Discharge: Family;Available 24 hours/day (has son and daughter (is an Therapist, sports)) Type of Home: House Home Access: Level entry     Home Layout: One level     Bathroom Shower/Tub: Occupational psychologist: Handicapped height Bathroom Accessibility: Yes How Accessible: Accessible via [place] Home Equipment: [employer] - single point   Additional Comments: was not using AD prior to this leg pain, has cane      Prior Functioning/Environment Prior Level of Function : Independent/Modified Independent;Driving             Mobility Comments: using SPC prior to admission, nothing before that, drives ADLs Comments: States he is (I). There is a handoff note from [facility] OT attempts stating that family would "like tips on getting him to take his meds", so perhaps pt having difficulty with medication management. Unclear, as pt  presents with signs of mild cognitive impairments.        OT Problem List: Impaired balance (sitting and/or standing);Decreased cognition;Decreased knowledge of use of DME or AE;Decreased safety awareness      OT Treatment/Interventions: Self-care/ADL training;Therapeutic exercise;Therapeutic activities;DME and/or AE instruction;Patient/family education    OT Goals(Current goals can be found in the care plan section) Acute Rehab OT Goals Patient Stated Goal: Go home. OT Goal Formulation: With patient Time For Goal Achievement: 04/29/22 Potential to Achieve Goals: Good ADL Goals Pt Will Perform Lower Body Bathing: with modified independence;sit to/from stand Pt Will Perform Toileting - Clothing Manipulation and hygiene: with modified independence;sit to/from stand Additional ADL Goal #1: Pt will perform medication management with 100% accuracy over a period of 1  week using assistive devices if needed.  OT Frequency: Min 2X/week    Co-evaluation              AM-PAC OT "6 Clicks" Daily Activity     Outcome Measure Help from another person eating meals?: None Help from another person taking care of personal grooming?: A Little Help from another person toileting, which includes using toliet, bedpan, or urinal?: A Little Help from another person bathing (including washing, rinsing, drying)?: A Little Help from another person to put on and taking off regular upper body clothing?: A Little Help from another person to put on and taking off regular lower body clothing?: A Little 6 Click Score: 19   End of Session Equipment Utilized During Treatment: Rolling Ellenbecker (2 wheels) Nurse Communication: Mobility status  Activity Tolerance: Patient limited by pain Patient left: in chair;with call bell/phone within reach (nurse just outside of pt's room)  OT Visit Diagnosis: Unsteadiness on feet (R26.81)                Time: [id number] OT Time Calculation (min): 23 min Charges:  OT General Charges $OT Visit: 1 Visit OT Evaluation $OT Eval Moderate Complexity: 1 Mod  Alawna Graybeal Carleene Mains, MS, OTR/L   Vania Rea 04/15/2022, 11:23 AM

## 2022-04-15 NOTE — Progress Notes (Signed)
Subjective: Interval History: none..   Objective: Vital signs in last 24 hours: Temp:  [97.7 F (36.5 C)-98.9 F (37.2 C)] 97.7 F (36.5 C) (03/23 0823) Pulse Rate:  [72-98] 76 (03/23 0900) Resp:  [14-28] 25 (03/23 0900) BP: (88-147)/(66-103) 124/88 (03/23 0900) SpO2:  [77 %-100 %] 99 % (03/23 0900) Weight:  [83.7 kg] 83.7 kg (03/23 0400)  Intake/Output from previous day: 03/22 0701 - 03/23 0700 In: 815 [P.O.:240; IV Piggyback:50] Out: [id number] [Urine:875] Intake/Output this shift: Total I/O In: -  Out: 225 [Urine:225]  General appearance: alert, no distress, and easily conversant.  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 Extremities: edema mild edema bilaterally and left foot warm and well-perfused.  No gross neurologic deficit.  Able to wiggle his toes and dorsiflex and plantarflex at the ankle Incision/Wound: Right groin access site dressed and without hematoma.  There is mild surrounding ecchymosis.  Lab Results: Recent Labs    04/14/22 0302 04/15/22 0402  WBC 5.2 5.1  HGB 12.6* 12.4*  HCT 38.3* 37.0*  PLT 121* 115*   BMET Recent Labs    04/14/22 0302 04/15/22 0402  NA 138 133*  K 4.2 3.3*  CL 105 106  CO2 21* 21*  GLUCOSE 122* 128*  BUN 20 17  CREATININE 1.40* 1.31*  CALCIUM 8.3* 7.8*    Studies/Results: MR CARDIAC MORPHOLOGY W WO CONTRAST  Result Date: 04/14/2022 CLINICAL DATA:  LA mass, cardiomyopathy EXAM: CARDIAC MRI TECHNIQUE: The patient was scanned on a 1.5 Tesla Siemens magnet. A dedicated cardiac coil was used. Functional imaging was done using Fiesta sequences. 2,3, and 4 chamber views were done to assess for RWMA's. Modified Simpson's rule using a short axis stack was used to calculate an ejection fraction on a dedicated work Conservation officer, nature. The patient received 11 cc of Gadavist. After 10 minutes inversion recovery sequences were used to assess for infiltration  and scar tissue. Velocity flow mapping performed in the ascending aorta and main pulmonary artery. CONTRAST:  11 cc  of Gadavist FINDINGS: 1. Moderately dilated LV size, severely reduced systolic function (LVEF = 18%). Normal LV wall thickness. There is global hypokinesis with no regional wall motion abnormalities. There is no late gadolinium enhancement in the left ventricular myocardium. Mildly elevated LV native T1 value of 1046 ms (normal <1000) LV ECV 32% (normal <30%) LVEDV: 291 ml LVESV: 240 ml SV: 51 ml CO: 4 L/min Myocardial mass: 150 g 2. Normal right ventricular size and thickness. Mildly reduced RV systolic function (RVEF = 39%). 3. Severely dilated left atrium, mildly dilated right atrium. There is a 31 x 22 mm mass in the superior and posterior aspect of the left atrium most consistent with a thrombus. 4. Normal size of the aortic root, ascending aorta and pulmonary artery. 5.  Mild MR, mild AI. 6.  Normal pericardium.  No pericardial effusion. IMPRESSION: 1. Moderately dilated LV, severely reduced LV systolic function. LVEF 18%. 2. There is a 31 x 22 mm mass in the superior and posterior aspect of the left atrium most consistent with a thrombus. 3. There is no late gadolinium enhancement in the left ventricular myocardium. 4. There is no evidence for myocardial infiltration. 5. Mildly reduced RV function. 6. Findings consistent with dilated nonischemic cardiomyopathy. Electronically Signed   By: Kate Sable M.D.   On: 04/14/2022 17:53   MR CARDIAC VELOCITY FLOW MAP  Result Date: 04/14/2022 CLINICAL DATA:  LA mass,  cardiomyopathy EXAM: CARDIAC MRI TECHNIQUE: The patient was scanned on a 1.5 Tesla Siemens magnet. A dedicated cardiac coil was used. Functional imaging was done using Fiesta sequences. 2,3, and 4 chamber views were done to assess for RWMA's. Modified Simpson's rule using a short axis stack was used to calculate an ejection fraction on a dedicated work Doctor, hospital. The patient received 11 cc of Gadavist. After 10 minutes inversion recovery sequences were used to assess for infiltration and scar tissue. Velocity flow mapping performed in the ascending aorta and main pulmonary artery. CONTRAST:  11 cc  of Gadavist FINDINGS: 1. Moderately dilated LV size, severely reduced systolic function (LVEF = 18%). Normal LV wall thickness. There is global hypokinesis with no regional wall motion abnormalities. There is no late gadolinium enhancement in the left ventricular myocardium. Mildly elevated LV native T1 value of 1046 ms (normal <1000) LV ECV 32% (normal <30%) LVEDV: 291 ml LVESV: 240 ml SV: 51 ml CO: 4 L/min Myocardial mass: 150 g 2. Normal right ventricular size and thickness. Mildly reduced RV systolic function (RVEF = 39%). 3. Severely dilated left atrium, mildly dilated right atrium. There is a 31 x 22 mm mass in the superior and posterior aspect of the left atrium most consistent with a thrombus. 4. Normal size of the aortic root, ascending aorta and pulmonary artery. 5.  Mild MR, mild AI. 6.  Normal pericardium.  No pericardial effusion. IMPRESSION: 1. Moderately dilated LV, severely reduced LV systolic function. LVEF 18%. 2. There is a 31 x 22 mm mass in the superior and posterior aspect of the left atrium most consistent with a thrombus. 3. There is no late gadolinium enhancement in the left ventricular myocardium. 4. There is no evidence for myocardial infiltration. 5. Mildly reduced RV function. 6. Findings consistent with dilated nonischemic cardiomyopathy. Electronically Signed   By: Kate Sable M.D.   On: 04/14/2022 17:53   PERIPHERAL VASCULAR CATHETERIZATION  Result Date: 04/13/2022 See surgical note for result.  PERIPHERAL VASCULAR CATHETERIZATION  Result Date: 04/12/2022 See surgical note for result.  ECHOCARDIOGRAM COMPLETE  Result Date: 04/12/2022    ECHOCARDIOGRAM REPORT   Patient Name:   Curtis Wagner Date of Exam: 04/12/2022  Medical Rec #:  [id number]      Height:       71.0 in Accession #:    [id number]     Weight:       174.2 lb Date of Birth:  1941-01-03      BSA:          1.988 m Patient Age:    82 years       BP:           130/88 mmHg Patient Gender: M              HR:           79 bpm. Exam Location:  [facility] Procedure: 2D Echo, Cardiac Doppler, Color Doppler and Intracardiac            Opacification Agent Indications:     Thrombus in heart chamber  History:         Patient has no prior history of Echocardiogram examinations.                  Arrythmias:Atrial Fibrillation; Risk Factors:Hypertension,                  Diabetes and Dyslipidemia. Pulmonary embolus.  Sonographer:     Wenda Low  Referring Phys:  [place] Diagnosing Phys: Kate Sable MD IMPRESSIONS  1. Left ventricular ejection fraction, by estimation, is <20%. The left ventricle has severely decreased function. The left ventricle demonstrates global hypokinesis. The left ventricular internal cavity size was mildly dilated. Left ventricular diastolic parameters are indeterminate.  2. Right ventricular systolic function is low normal. The right ventricular size is normal. There is moderately elevated pulmonary artery systolic pressure. The estimated right ventricular systolic pressure is 0000000 mmHg.  3. There is a 3 x 2.5 cm mass attached to the superior-posterior wall of the left atrium. Differential includes tumor (myxoma) vs thrombus- less likely. consider CMR for better characterization.. Left atrial size was mildly dilated.  4. Right atrial size was moderately dilated.  5. The mitral valve is normal in structure. Mild mitral valve regurgitation.  6. The aortic valve is tricuspid. Aortic valve regurgitation is mild. Aortic valve sclerosis/calcification is present, without any evidence of aortic stenosis.  7. The inferior vena cava is dilated in size with <50% respiratory variability, suggesting right atrial pressure of 15 mmHg. FINDINGS  Left  Ventricle: Left ventricular ejection fraction, by estimation, is <20%. The left ventricle has severely decreased function. The left ventricle demonstrates global hypokinesis. Definity contrast agent was given IV to delineate the left ventricular endocardial borders. The left ventricular internal cavity size was mildly dilated. There is no left ventricular hypertrophy. Left ventricular diastolic parameters are indeterminate. Right Ventricle: The right ventricular size is normal. No increase in right ventricular wall thickness. Right ventricular systolic function is low normal. There is moderately elevated pulmonary artery systolic pressure. The tricuspid regurgitant velocity  is 2.81 m/s, and with an assumed right atrial pressure of 15 mmHg, the estimated right ventricular systolic pressure is 0000000 mmHg. Left Atrium: There is a 3 x 2.5 cm mass attached to the superior-posterior wall of the left atrium. Differential includes tumor (myxoma) vs thrombus- less likely. consider CMR for better characterization. Left atrial size was mildly dilated. Right Atrium: Right atrial size was moderately dilated. Pericardium: There is no evidence of pericardial effusion. Mitral Valve: The mitral valve is normal in structure. Mild mitral valve regurgitation. MV peak gradient, 6.8 mmHg. The mean mitral valve gradient is 3.0 mmHg. Tricuspid Valve: The tricuspid valve is normal in structure. Tricuspid valve regurgitation is mild. Aortic Valve: The aortic valve is tricuspid. Aortic valve regurgitation is mild. Aortic regurgitation PHT measures 552 msec. Aortic valve sclerosis/calcification is present, without any evidence of aortic stenosis. Aortic valve mean gradient measures 2.3  mmHg. Aortic valve peak gradient measures 4.1 mmHg. Aortic valve area, by VTI measures 2.49 cm. Pulmonic Valve: The pulmonic valve was normal in structure. Pulmonic valve regurgitation is mild. Aorta: The aortic root is normal in size and structure. Venous:  The inferior vena cava is dilated in size with less than 50% respiratory variability, suggesting right atrial pressure of 15 mmHg. IAS/Shunts: No atrial level shunt detected by color flow Doppler.  LEFT VENTRICLE PLAX 2D LVIDd:         5.90 cm LVIDs:         5.50 cm LV PW:         1.00 cm LV IVS:        0.90 cm LVOT diam:     1.90 cm LV SV:         48 LV SV Index:   24 LVOT Area:     2.84 cm  LV Volumes (MOD) LV vol d, MOD A2C: 196.0 ml  LV vol d, MOD A4C: 148.0 ml LV vol s, MOD A2C: 144.0 ml LV vol s, MOD A4C: 120.0 ml LV SV MOD A2C:     52.0 ml LV SV MOD A4C:     148.0 ml LV SV MOD BP:      43.0 ml RIGHT VENTRICLE RV Basal diam:  3.80 cm RV Mid diam:    2.40 cm RV S prime:     16.50 cm/s TAPSE (M-mode): 1.7 cm LEFT ATRIUM              Index        RIGHT ATRIUM           Index LA diam:        5.00 cm  2.51 cm/m   RA Area:     26.70 cm LA Vol (A2C):   179.0 ml 90.02 ml/m  RA Volume:   88.20 ml  44.36 ml/m LA Vol (A4C):   66.3 ml  33.34 ml/m LA Biplane Vol: 119.0 ml 59.85 ml/m  AORTIC VALVE                    PULMONIC VALVE AV Area (Vmax):    2.36 cm     PV Vmax:       0.70 m/s AV Area (Vmean):   2.27 cm     PV Peak grad:  2.0 mmHg AV Area (VTI):     2.49 cm AV Vmax:           101.73 cm/s AV Vmean:          68.867 cm/s AV VTI:            0.191 m AV Peak Grad:      4.1 mmHg AV Mean Grad:      2.3 mmHg LVOT Vmax:         84.75 cm/s LVOT Vmean:        55.100 cm/s LVOT VTI:          0.168 m LVOT/AV VTI ratio: 0.88 AI PHT:            552 msec  AORTA Ao Root diam: 3.50 cm Ao Asc diam:  3.70 cm MITRAL VALVE               TRICUSPID VALVE MV Area (PHT): 4.46 cm    TR Peak grad:   31.6 mmHg MV Area VTI:   1.76 cm    TR Vmax:        281.00 cm/s MV Peak grad:  6.8 mmHg MV Mean grad:  3.0 mmHg    SHUNTS MV Vmax:       1.30 m/s    Systemic VTI:  0.17 m MV Vmean:      73.8 cm/s   Systemic Diam: 1.90 cm MV Decel Time: 170 msec MV E velocity: 82.70 cm/s Kate Sable MD Electronically signed by Kate Sable MD  Signature Date/Time: 04/12/2022/12:10:29 PM    Final    CT ANGIO AO+BIFEM W & OR WO CONTRAST  Result Date: 04/12/2022 CLINICAL DATA:  absent pedal pulse / leg pain/ cold ext. Left leg numbness. EXAM: CT ANGIOGRAPHY OF ABDOMINAL AORTA WITH ILIOFEMORAL RUNOFF TECHNIQUE: Multidetector CT imaging of the abdomen, pelvis and lower extremities was performed using the standard protocol during bolus administration of intravenous contrast. Multiplanar CT image reconstructions and MIPs were obtained to evaluate the vascular anatomy. RADIATION DOSE REDUCTION: This exam was performed according to the departmental dose-optimization program which includes automated  exposure control, adjustment of the mA and/or kV according to patient size and/or use of iterative reconstruction technique. CONTRAST:  14mL OMNIPAQUE IOHEXOL 350 MG/ML SOLN COMPARISON:  None Available. FINDINGS: VASCULAR Heart: There are irregular filling defects noted along the posterior wall of the left atrium concerning for clot. Cardiomegaly. Aorta: Aortic atherosclerosis.  No aneurysm or dissection. Celiac: Patent without evidence of aneurysm, dissection, vasculitis or significant stenosis. SMA: Patent without evidence of aneurysm, dissection, vasculitis or significant stenosis. Renals: Both renal arteries are patent without evidence of aneurysm, dissection, vasculitis, fibromuscular dysplasia or significant stenosis. IMA: Patent without evidence of aneurysm, dissection, vasculitis or significant stenosis. RIGHT Lower Extremity Inflow: Common, internal and external iliac arteries are patent without evidence of aneurysm, dissection, vasculitis or significant stenosis. Moderate atherosclerotic calcifications. Outflow: Common, superficial and profunda femoral arteries and the popliteal artery are patent without evidence of aneurysm, dissection, vasculitis or significant stenosis. Scattered calcifications. Runoff: Disease three-vessel runoff in the right calf with  dominant vessel the posterior tibial artery. All 3 vessels appear patent to the ankle. LEFT Lower Extremity Inflow: Common, internal and external iliac arteries are patent without evidence of aneurysm, dissection, vasculitis or significant stenosis. Scattered calcifications. Outflow: Left common femoral artery is patent. 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 Superficial femoral artery and popliteal artery remain occluded along their entire course. Runoff: Reconstitution of the trifurcation vessels in the left calf. All 3 vessels demonstrate disease. The anterior tibial artery appears to occlude in the distal calf. Peroneal and dominant posterior tibial arteries are patent to the ankle. Veins: No obvious venous abnormality within the limitations of this arterial phase study. Review of the MIP images confirms the above findings. NON-VASCULAR Lower chest: Small bilateral pleural effusions. Dependent bibasilar atelectasis. Hepatobiliary: Small layering gallstones within the gallbladder. No focal hepatic abnormality. Pancreas: No focal abnormality or ductal dilatation. Spleen: No focal abnormality.  Normal size. Adrenals/Urinary Tract: Diffuse enlargement of the left adrenal gland. Numerous bilateral renal cysts, the largest 4.3 cm in the right midpole. Moderate left hydronephrosis. Left ureter is dilated to the bladder. No obstructing stones visualized. No hydronephrosis on the right. Multiple nonobstructing left renal stones measuring up to 14 mm in the midpole. Urinary bladder grossly unremarkable. Stomach/Bowel: Normal appendix. Sigmoid diverticulosis. No active diverticulitis. Stomach and small bowel decompressed, unremarkable. Lymphatic: No adenopathy Reproductive: Prostate enlargement. Other: No free fluid or free air. Musculoskeletal: No acute bony abnormality. IMPRESSION: VASCULAR Filling defect along the  posterior wall of the left atrium concerning for clot. This could be further evaluated with echo. Cardiomegaly. Atherosclerotic irregularity throughout the aorta and iliac vessels. 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 Reconstitution of the trifurcation vessels below the left knee with dominant runoff via a diseased posterior tibial artery. Mild to moderate disease throughout the right leg inflow and outflow vessels. Three-vessel runoff to the ankle with dominant flow via the posterior tibial artery. NON-VASCULAR Small bilateral pleural effusions. Cholelithiasis. Left hydronephrosis. Left ureter is dilated to the bladder. No visible obstructing stones. Prostate enlargement. Electronically Signed   By: Rolm Baptise M.D.   On: 04/12/2022 02:34   MR BRAIN WO CONTRAST  Result Date: 04/12/2022 CLINICAL DATA:  Initial evaluation for syncope/presyncope. EXAM: MRI HEAD WITHOUT CONTRAST TECHNIQUE: Multiplanar, multiecho pulse sequences of the brain and surrounding structures were obtained without intravenous contrast. COMPARISON:  None Available. FINDINGS: Brain:  Diffuse prominence of the CSF containing spaces compatible generalized age-related cerebral atrophy. Patchy and confluent T2/FLAIR hyperintensity involving the periventricular deep white matter both cerebral hemispheres as well as the pons, consistent with chronic small vessel ischemic disease, moderately advanced. No evidence for acute or subacute ischemia. Gray-white matter differentiation maintained. No areas of chronic cortical infarction. No acute or chronic intracranial blood products. No mass lesion, midline shift or mass effect. No hydrocephalus or extra-axial fluid collection. Pituitary gland and suprasellar region within normal limits. Vascular: Major intracranial vascular flow voids are maintained. Skull and upper cervical spine: Craniocervical junction  within normal limits. Bone marrow signal intensity normal. No scalp soft tissue abnormality. Sinuses/Orbits: Prior ocular lens replacement on the right. Globes and orbital soft tissues demonstrate no acute finding. Paranasal sinuses are clear. Trace bilateral mastoid effusions noted, of doubtful significance. Other: None. IMPRESSION: 1. No acute intracranial abnormality. 2. Age-related cerebral atrophy with moderately advanced chronic microvascular ischemic disease. Electronically Signed   By: Jeannine Boga M.D.   On: 04/12/2022 00:39   DG Ankle 2 Views Left  Result Date: 04/11/2022 CLINICAL DATA:  Multiple recent falls. EXAM: LEFT ANKLE - 2 VIEW COMPARISON:  None Available. FINDINGS: There is no evidence of an acute fracture or dislocation. A chronic deformity is seen involving the medial aspect of the left navicular bone, with chronic changes also noted along the left lateral malleolus and left medial malleolus. Mild degenerative changes are seen involving the left ankle, with moderate severity degenerative changes noted along the dorsal aspect of the proximal to mid left foot. Soft tissues are unremarkable. IMPRESSION: 1. No acute osseous abnormality. 2. Chronic and degenerative changes, as described above. Electronically Signed   By: Virgina Norfolk M.D.   On: 04/11/2022 20:15   DG Foot 2 Views Left  Result Date: 04/11/2022 CLINICAL DATA:  Pain after falls EXAM: LEFT FOOT - 2 VIEW COMPARISON:  None Available. FINDINGS: Calcaneal spurs. Mild dorsal spurring in the midfoot. No fracture or dislocation. Normal alignment. Normal mineralization. No radiodense foreign body. No subcutaneous gas. IMPRESSION: Degenerative changes as above. No acute findings. Electronically Signed   By: Lucrezia Europe M.D.   On: 04/11/2022 20:07   DG Hip Unilat W or Wo Pelvis 2-3 Views Left  Result Date: 04/11/2022 CLINICAL DATA:  Fall, pain. EXAM: DG HIP (WITH OR WITHOUT PELVIS) 2-3V LEFT COMPARISON:  None Available.  FINDINGS: There is no evidence of hip fracture or dislocation. There is no evidence of arthropathy or other focal bone abnormality. Vascular calcifications. IMPRESSION: No acute bony abnormality. Electronically Signed   By: Rolm Baptise M.D.   On: 04/11/2022 20:06   Anti-infectives: Anti-infectives (From admission, onward)    Start     Dose/Rate Route Frequency Ordered Stop   04/12/22 1426  ceFAZolin (ANCEF) IVPB 2g/100 mL premix        2 g 200 mL/hr over 30 Minutes Intravenous 30 min pre-op 04/12/22 1426 04/13/22 0817       Assessment/Plan: s/p Procedure(s): LOWER EXTREMITY INTERVENTION (Left) Status post percutaneous thrombectomy left lower extremity-normal early convalescence.  His leg is warm and well-perfused and he is regaining some of the neurologic deficit reported earlier.  Continue cardiovascular risk factor modification regimen including anticoagulation for his dysrhythmias.   LOS: 3 days   Bertram Savin 04/15/2022, 10:32 AM

## 2022-04-15 NOTE — Consult Note (Signed)
PHARMACY CONSULT NOTE - FOLLOW UP  Pharmacy Consult for Electrolyte Monitoring and Replacement   Recent Labs: Potassium (mmol/L)  Date Value  04/15/2022 3.3 (L)   Magnesium (mg/dL)  Date Value  04/15/2022 2.1   Calcium (mg/dL)  Date Value  04/15/2022 7.8 (L)   Albumin (g/dL)  Date Value  04/15/2022 2.6 (L)   Phosphorus (mg/dL)  Date Value  04/15/2022 2.6   Sodium (mmol/L)  Date Value  04/15/2022 133 (L)  06/16/2016 143     Assessment: 82 yo M with pertinent PMH HFrEF, pAF presents with limb ischemia now s/p thrombectomy. Pharmacy consulted to manage electrolytes while pt in CCU. Pt is on amio. IV lasix x 2 doses.   Goal of Therapy:  K >/= 4.0 and Mg >/= 2.0  Plan:  Kcl 40 mEq x 2 doses by medical team.  Follow up electrolytes with AM labs  Oswald Hillock ,PharmD Clinical Pharmacist 04/15/2022 8:37 AM

## 2022-04-16 DIAGNOSIS — N1831 Chronic kidney disease, stage 3a: Secondary | ICD-10-CM | POA: Insufficient documentation

## 2022-04-16 DIAGNOSIS — E440 Moderate protein-calorie malnutrition: Secondary | ICD-10-CM | POA: Insufficient documentation

## 2022-04-16 DIAGNOSIS — I4729 Other ventricular tachycardia: Secondary | ICD-10-CM | POA: Insufficient documentation

## 2022-04-16 DIAGNOSIS — I420 Dilated cardiomyopathy: Secondary | ICD-10-CM | POA: Diagnosis not present

## 2022-04-16 DIAGNOSIS — M79605 Pain in left leg: Secondary | ICD-10-CM | POA: Diagnosis not present

## 2022-04-16 DIAGNOSIS — I5022 Chronic systolic (congestive) heart failure: Secondary | ICD-10-CM | POA: Insufficient documentation

## 2022-04-16 DIAGNOSIS — I272 Pulmonary hypertension, unspecified: Secondary | ICD-10-CM | POA: Insufficient documentation

## 2022-04-16 DIAGNOSIS — I480 Paroxysmal atrial fibrillation: Secondary | ICD-10-CM

## 2022-04-16 DIAGNOSIS — I70222 Atherosclerosis of native arteries of extremities with rest pain, left leg: Secondary | ICD-10-CM | POA: Diagnosis not present

## 2022-04-16 DIAGNOSIS — E876 Hypokalemia: Secondary | ICD-10-CM | POA: Insufficient documentation

## 2022-04-16 DIAGNOSIS — Z515 Encounter for palliative care: Secondary | ICD-10-CM

## 2022-04-16 DIAGNOSIS — I2699 Other pulmonary embolism without acute cor pulmonale: Secondary | ICD-10-CM | POA: Diagnosis not present

## 2022-04-16 DIAGNOSIS — R42 Dizziness and giddiness: Secondary | ICD-10-CM | POA: Diagnosis not present

## 2022-04-16 DIAGNOSIS — Z86711 Personal history of pulmonary embolism: Secondary | ICD-10-CM | POA: Insufficient documentation

## 2022-04-16 DIAGNOSIS — E871 Hypo-osmolality and hyponatremia: Secondary | ICD-10-CM | POA: Insufficient documentation

## 2022-04-16 DIAGNOSIS — Z7189 Other specified counseling: Secondary | ICD-10-CM

## 2022-04-16 DIAGNOSIS — D696 Thrombocytopenia, unspecified: Secondary | ICD-10-CM | POA: Insufficient documentation

## 2022-04-16 DIAGNOSIS — W19XXXA Unspecified fall, initial encounter: Secondary | ICD-10-CM | POA: Diagnosis not present

## 2022-04-16 DIAGNOSIS — I998 Other disorder of circulatory system: Secondary | ICD-10-CM | POA: Diagnosis not present

## 2022-04-16 LAB — COMPREHENSIVE METABOLIC PANEL
ALT: 14 U/L (ref 0–44)
AST: 32 U/L (ref 15–41)
Albumin: 3 g/dL — ABNORMAL LOW (ref 3.5–5.0)
Alkaline Phosphatase: 109 U/L (ref 38–126)
Anion gap: 10 (ref 5–15)
BUN: 16 mg/dL (ref 8–23)
CO2: 24 mmol/L (ref 22–32)
Calcium: 8.4 mg/dL — ABNORMAL LOW (ref 8.9–10.3)
Chloride: 101 mmol/L (ref 98–111)
Creatinine, Ser: 1.44 mg/dL — ABNORMAL HIGH (ref 0.61–1.24)
GFR, Estimated: 48 mL/min — ABNORMAL LOW (ref >60)
Glucose, Bld: 105 mg/dL — ABNORMAL HIGH (ref 70–99)
Potassium: 4.2 mmol/L (ref 3.5–5.1)
Sodium: 135 mmol/L (ref 135–145)
Total Bilirubin: 1.4 mg/dL — ABNORMAL HIGH (ref 0.3–1.2)
Total Protein: 6.4 g/dL — ABNORMAL LOW (ref 6.5–8.1)

## 2022-04-16 LAB — CBC
HCT: 44.7 % (ref 39.0–52.0)
Hemoglobin: 14.7 g/dL (ref 13.0–17.0)
MCH: 32.7 pg (ref 26.0–34.0)
MCHC: 32.9 g/dL (ref 30.0–36.0)
MCV: 99.6 fL (ref 80.0–100.0)
Platelets: 170 10*3/uL (ref 150–400)
RBC: 4.49 MIL/uL (ref 4.22–5.81)
RDW: 13.4 % (ref 11.5–15.5)
WBC: 5.7 10*3/uL (ref 4.0–10.5)
nRBC: 0 % (ref 0.0–0.2)

## 2022-04-16 LAB — PHOSPHORUS: Phosphorus: 3.4 mg/dL (ref 2.5–4.6)

## 2022-04-16 LAB — MAGNESIUM: Magnesium: 1.8 mg/dL (ref 1.7–2.4)

## 2022-04-16 LAB — C-REACTIVE PROTEIN: CRP: 7.2 mg/dL — ABNORMAL HIGH (ref <1.0)

## 2022-04-16 LAB — CK: Total CK: 53 U/L (ref 49–397)

## 2022-04-16 MED ORDER — ENSURE ENLIVE PO LIQD
237.0000 mL | Freq: Two times a day (BID) | ORAL | Status: DC
  Administered 2022-04-16 – 2022-04-17 (×2): 237 mL via ORAL

## 2022-04-16 MED ORDER — MAGNESIUM SULFATE 2 GM/50ML IV SOLN
2.0000 g | Freq: Once | INTRAVENOUS | Status: AC
  Administered 2022-04-16: 2 g via INTRAVENOUS
  Filled 2022-04-16: qty 50

## 2022-04-16 NOTE — Progress Notes (Signed)
Subjective: Interval History: none..   Objective: Vital signs in last 24 hours: Temp:  [97.2 F (36.2 C)-97.7 F (36.5 C)] 97.3 F (36.3 C) (03/24 0044) Pulse Rate:  [55-83] 75 (03/24 0600) Resp:  [11-28] 24 (03/24 0600) BP: (95-130)/(68-104) 119/84 (03/24 0600) SpO2:  [95 %-100 %] 97 % (03/24 0600) Weight:  [83 kg] 83 kg (03/24 0234)  Intake/Output from previous day: 03/23 0701 - 03/24 0700 In: 1203 [P.O.:1200; I.V.:3] Out: 2850 [Urine:2850] Intake/Output this shift: No intake/output data recorded.  General appearance: alert and no distress Resp: Normal respiratory effort Cardio: irregularly irregular rhythm Extremities: Left lower extremity with mild edema.  Right femoral access site dressing removed.  No evidence of hematoma.  There remains some mild ecchymosis unchanged. He continues to have a strongly palpable dorsalis pedis pulse on the left.  Lab Results: Recent Labs    04/15/22 0402 04/16/22 0419  WBC 5.1 5.7  HGB 12.4* 14.7  HCT 37.0* 44.7  PLT 115* 170   BMET Recent Labs    04/15/22 0402 04/16/22 0419  NA 133* 135  K 3.3* 4.2  CL 106 101  CO2 21* 24  GLUCOSE 128* 105*  BUN 17 16  CREATININE 1.31* 1.44*  CALCIUM 7.8* 8.4*    Studies/Results: MR CARDIAC MORPHOLOGY W WO CONTRAST  Result Date: 04/14/2022 CLINICAL DATA:  LA mass, cardiomyopathy EXAM: CARDIAC MRI TECHNIQUE: The patient was scanned on a 1.5 Tesla Siemens magnet. A dedicated cardiac coil was used. Functional imaging was done using Fiesta sequences. 2,3, and 4 chamber views were done to assess for RWMA's. Modified Simpson's rule using a short axis stack was used to calculate an ejection fraction on a dedicated work Conservation officer, nature. The patient received 11 cc of Gadavist. After 10 minutes inversion recovery sequences were used to assess for infiltration and scar tissue. Velocity flow mapping performed in the ascending aorta and main pulmonary artery. CONTRAST:  11 cc  of Gadavist  FINDINGS: 1. Moderately dilated LV size, severely reduced systolic function (LVEF = 18%). Normal LV wall thickness. There is global hypokinesis with no regional wall motion abnormalities. There is no late gadolinium enhancement in the left ventricular myocardium. Mildly elevated LV native T1 value of 1046 ms (normal <1000) LV ECV 32% (normal <30%) LVEDV: 291 ml LVESV: 240 ml SV: 51 ml CO: 4 L/min Myocardial mass: 150 g 2. Normal right ventricular size and thickness. Mildly reduced RV systolic function (RVEF = 39%). 3. Severely dilated left atrium, mildly dilated right atrium. There is a 31 x 22 mm mass in the superior and posterior aspect of the left atrium most consistent with a thrombus. 4. Normal size of the aortic root, ascending aorta and pulmonary artery. 5.  Mild MR, mild AI. 6.  Normal pericardium.  No pericardial effusion. IMPRESSION: 1. Moderately dilated LV, severely reduced LV systolic function. LVEF 18%. 2. There is a 31 x 22 mm mass in the superior and posterior aspect of the left atrium most consistent with a thrombus. 3. There is no late gadolinium enhancement in the left ventricular myocardium. 4. There is no evidence for myocardial infiltration. 5. Mildly reduced RV function. 6. Findings consistent with dilated nonischemic cardiomyopathy. Electronically Signed   By: Kate Sable M.D.   On: 04/14/2022 17:53   MR CARDIAC VELOCITY FLOW MAP  Result Date: 04/14/2022 CLINICAL DATA:  LA mass, cardiomyopathy EXAM: CARDIAC MRI TECHNIQUE: The patient was scanned on a 1.5 Tesla Siemens magnet. A dedicated cardiac coil was used. Functional imaging  was done using Fiesta sequences. 2,3, and 4 chamber views were done to assess for RWMA's. Modified Simpson's rule using a short axis stack was used to calculate an ejection fraction on a dedicated work Conservation officer, nature. The patient received 11 cc of Gadavist. After 10 minutes inversion recovery sequences were used to assess for infiltration and  scar tissue. Velocity flow mapping performed in the ascending aorta and main pulmonary artery. CONTRAST:  11 cc  of Gadavist FINDINGS: 1. Moderately dilated LV size, severely reduced systolic function (LVEF = 18%). Normal LV wall thickness. There is global hypokinesis with no regional wall motion abnormalities. There is no late gadolinium enhancement in the left ventricular myocardium. Mildly elevated LV native T1 value of 1046 ms (normal <1000) LV ECV 32% (normal <30%) LVEDV: 291 ml LVESV: 240 ml SV: 51 ml CO: 4 L/min Myocardial mass: 150 g 2. Normal right ventricular size and thickness. Mildly reduced RV systolic function (RVEF = 39%). 3. Severely dilated left atrium, mildly dilated right atrium. There is a 31 x 22 mm mass in the superior and posterior aspect of the left atrium most consistent with a thrombus. 4. Normal size of the aortic root, ascending aorta and pulmonary artery. 5.  Mild MR, mild AI. 6.  Normal pericardium.  No pericardial effusion. IMPRESSION: 1. Moderately dilated LV, severely reduced LV systolic function. LVEF 18%. 2. There is a 31 x 22 mm mass in the superior and posterior aspect of the left atrium most consistent with a thrombus. 3. There is no late gadolinium enhancement in the left ventricular myocardium. 4. There is no evidence for myocardial infiltration. 5. Mildly reduced RV function. 6. Findings consistent with dilated nonischemic cardiomyopathy. Electronically Signed   By: Kate Sable M.D.   On: 04/14/2022 17:53   PERIPHERAL VASCULAR CATHETERIZATION  Result Date: 04/13/2022 See surgical note for result.  PERIPHERAL VASCULAR CATHETERIZATION  Result Date: 04/12/2022 See surgical note for result.  ECHOCARDIOGRAM COMPLETE  Result Date: 04/12/2022    ECHOCARDIOGRAM REPORT   Patient Name:   Curtis Wagner Date of Exam: 04/12/2022 Medical Rec #:  [id number]      Height:       71.0 in Accession #:    [id number]     Weight:       174.2 lb Date of Birth:  [birth year]      BSA:           1.988 m Patient Age:    82 years       BP:           130/88 mmHg Patient Gender: M              HR:           79 bpm. Exam Location:  [facility] Procedure: 2D Echo, Cardiac Doppler, Color Doppler and Intracardiac            Opacification Agent Indications:     Thrombus in heart chamber  History:         Patient has no prior history of Echocardiogram examinations.                  Arrythmias:Atrial Fibrillation; Risk Factors:Hypertension,                  Diabetes and Dyslipidemia. Pulmonary embolus.  Sonographer:     Wenda Low Referring Phys:  [place] Diagnosing Phys: Kate Sable MD IMPRESSIONS  1. Left ventricular ejection fraction, by estimation, is <20%.  The left ventricle has severely decreased function. The left ventricle demonstrates global hypokinesis. The left ventricular internal cavity size was mildly dilated. Left ventricular diastolic parameters are indeterminate.  2. Right ventricular systolic function is low normal. The right ventricular size is normal. There is moderately elevated pulmonary artery systolic pressure. The estimated right ventricular systolic pressure is 0000000 mmHg.  3. There is a 3 x 2.5 cm mass attached to the superior-posterior wall of the left atrium. Differential includes tumor (myxoma) vs thrombus- less likely. consider CMR for better characterization.. Left atrial size was mildly dilated.  4. Right atrial size was moderately dilated.  5. The mitral valve is normal in structure. Mild mitral valve regurgitation.  6. The aortic valve is tricuspid. Aortic valve regurgitation is mild. Aortic valve sclerosis/calcification is present, without any evidence of aortic stenosis.  7. The inferior vena cava is dilated in size with <50% respiratory variability, suggesting right atrial pressure of 15 mmHg. FINDINGS  Left Ventricle: Left ventricular ejection fraction, by estimation, is <20%. The left ventricle has severely decreased function. The left ventricle  demonstrates global hypokinesis. Definity contrast agent was given IV to delineate the left ventricular endocardial borders. The left ventricular internal cavity size was mildly dilated. There is no left ventricular hypertrophy. Left ventricular diastolic parameters are indeterminate. Right Ventricle: The right ventricular size is normal. No increase in right ventricular wall thickness. Right ventricular systolic function is low normal. There is moderately elevated pulmonary artery systolic pressure. The tricuspid regurgitant velocity  is 2.81 m/s, and with an assumed right atrial pressure of 15 mmHg, the estimated right ventricular systolic pressure is 0000000 mmHg. Left Atrium: There is a 3 x 2.5 cm mass attached to the superior-posterior wall of the left atrium. Differential includes tumor (myxoma) vs thrombus- less likely. consider CMR for better characterization. Left atrial size was mildly dilated. Right Atrium: Right atrial size was moderately dilated. Pericardium: There is no evidence of pericardial effusion. Mitral Valve: The mitral valve is normal in structure. Mild mitral valve regurgitation. MV peak gradient, 6.8 mmHg. The mean mitral valve gradient is 3.0 mmHg. Tricuspid Valve: The tricuspid valve is normal in structure. Tricuspid valve regurgitation is mild. Aortic Valve: The aortic valve is tricuspid. Aortic valve regurgitation is mild. Aortic regurgitation PHT measures 552 msec. Aortic valve sclerosis/calcification is present, without any evidence of aortic stenosis. Aortic valve mean gradient measures 2.3  mmHg. Aortic valve peak gradient measures 4.1 mmHg. Aortic valve area, by VTI measures 2.49 cm. Pulmonic Valve: The pulmonic valve was normal in structure. Pulmonic valve regurgitation is mild. Aorta: The aortic root is normal in size and structure. Venous: The inferior vena cava is dilated in size with less than 50% respiratory variability, suggesting right atrial pressure of 15 mmHg. IAS/Shunts:  No atrial level shunt detected by color flow Doppler.  LEFT VENTRICLE PLAX 2D LVIDd:         5.90 cm LVIDs:         5.50 cm LV PW:         1.00 cm LV IVS:        0.90 cm LVOT diam:     1.90 cm LV SV:         48 LV SV Index:   24 LVOT Area:     2.84 cm  LV Volumes (MOD) LV vol d, MOD A2C: 196.0 ml LV vol d, MOD A4C: 148.0 ml LV vol s, MOD A2C: 144.0 ml LV vol s, MOD A4C: 120.0 ml LV SV  MOD A2C:     52.0 ml LV SV MOD A4C:     148.0 ml LV SV MOD BP:      43.0 ml RIGHT VENTRICLE RV Basal diam:  3.80 cm RV Mid diam:    2.40 cm RV S prime:     16.50 cm/s TAPSE (M-mode): 1.7 cm LEFT ATRIUM              Index        RIGHT ATRIUM           Index LA diam:        5.00 cm  2.51 cm/m   RA Area:     26.70 cm LA Vol (A2C):   179.0 ml 90.02 ml/m  RA Volume:   88.20 ml  44.36 ml/m LA Vol (A4C):   66.3 ml  33.34 ml/m LA Biplane Vol: 119.0 ml 59.85 ml/m  AORTIC VALVE                    PULMONIC VALVE AV Area (Vmax):    2.36 cm     PV Vmax:       0.70 m/s AV Area (Vmean):   2.27 cm     PV Peak grad:  2.0 mmHg AV Area (VTI):     2.49 cm AV Vmax:           101.73 cm/s AV Vmean:          68.867 cm/s AV VTI:            0.191 m AV Peak Grad:      4.1 mmHg AV Mean Grad:      2.3 mmHg LVOT Vmax:         84.75 cm/s LVOT Vmean:        55.100 cm/s LVOT VTI:          0.168 m LVOT/AV VTI ratio: 0.88 AI PHT:            552 msec  AORTA Ao Root diam: 3.50 cm Ao Asc diam:  3.70 cm MITRAL VALVE               TRICUSPID VALVE MV Area (PHT): 4.46 cm    TR Peak grad:   31.6 mmHg MV Area VTI:   1.76 cm    TR Vmax:        281.00 cm/s MV Peak grad:  6.8 mmHg MV Mean grad:  3.0 mmHg    SHUNTS MV Vmax:       1.30 m/s    Systemic VTI:  0.17 m MV Vmean:      73.8 cm/s   Systemic Diam: 1.90 cm MV Decel Time: 170 msec MV E velocity: 82.70 cm/s Kate Sable MD Electronically signed by Kate Sable MD Signature Date/Time: 04/12/2022/12:10:29 PM    Final    CT ANGIO AO+BIFEM W & OR WO CONTRAST  Result Date: 04/12/2022 CLINICAL DATA:  absent  pedal pulse / leg pain/ cold ext. Left leg numbness. EXAM: CT ANGIOGRAPHY OF ABDOMINAL AORTA WITH ILIOFEMORAL RUNOFF TECHNIQUE: Multidetector CT imaging of the abdomen, pelvis and lower extremities was performed using the standard protocol during bolus administration of intravenous contrast. Multiplanar CT image reconstructions and MIPs were obtained to evaluate the vascular anatomy. RADIATION DOSE REDUCTION: This exam was performed according to the departmental dose-optimization program which includes automated exposure control, adjustment of the mA and/or kV according to patient size and/or use of iterative reconstruction technique. CONTRAST:  143mL OMNIPAQUE IOHEXOL  350 MG/ML SOLN COMPARISON:  None Available. FINDINGS: VASCULAR Heart: There are irregular filling defects noted along the posterior wall of the left atrium concerning for clot. Cardiomegaly. Aorta: Aortic atherosclerosis.  No aneurysm or dissection. Celiac: Patent without evidence of aneurysm, dissection, vasculitis or significant stenosis. SMA: Patent without evidence of aneurysm, dissection, vasculitis or significant stenosis. Renals: Both renal arteries are patent without evidence of aneurysm, dissection, vasculitis, fibromuscular dysplasia or significant stenosis. IMA: Patent without evidence of aneurysm, dissection, vasculitis or significant stenosis. RIGHT Lower Extremity Inflow: Common, internal and external iliac arteries are patent without evidence of aneurysm, dissection, vasculitis or significant stenosis. Moderate atherosclerotic calcifications. Outflow: Common, superficial and profunda femoral arteries and the popliteal artery are patent without evidence of aneurysm, dissection, vasculitis or significant stenosis. Scattered calcifications. Runoff: Disease three-vessel runoff in the right calf with dominant vessel the posterior tibial artery. All 3 vessels appear patent to the ankle. LEFT Lower Extremity Inflow: Common, internal and  external iliac arteries are patent without evidence of aneurysm, dissection, vasculitis or significant stenosis. Scattered calcifications. Outflow: Left common femoral artery is patent. 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 Superficial femoral artery and popliteal artery remain occluded along their entire course. Runoff: Reconstitution of the trifurcation vessels in the left calf. All 3 vessels demonstrate disease. The anterior tibial artery appears to occlude in the distal calf. Peroneal and dominant posterior tibial arteries are patent to the ankle. Veins: No obvious venous abnormality within the limitations of this arterial phase study. Review of the MIP images confirms the above findings. NON-VASCULAR Lower chest: Small bilateral pleural effusions. Dependent bibasilar atelectasis. Hepatobiliary: Small layering gallstones within the gallbladder. No focal hepatic abnormality. Pancreas: No focal abnormality or ductal dilatation. Spleen: No focal abnormality.  Normal size. Adrenals/Urinary Tract: Diffuse enlargement of the left adrenal gland. Numerous bilateral renal cysts, the largest 4.3 cm in the right midpole. Moderate left hydronephrosis. Left ureter is dilated to the bladder. No obstructing stones visualized. No hydronephrosis on the right. Multiple nonobstructing left renal stones measuring up to 14 mm in the midpole. Urinary bladder grossly unremarkable. Stomach/Bowel: Normal appendix. Sigmoid diverticulosis. No active diverticulitis. Stomach and small bowel decompressed, unremarkable. Lymphatic: No adenopathy Reproductive: Prostate enlargement. Other: No free fluid or free air. Musculoskeletal: No acute bony abnormality. IMPRESSION: VASCULAR Filling defect along the posterior wall of the left atrium concerning for clot. This could be further evaluated with echo. Cardiomegaly. Atherosclerotic irregularity  throughout the aorta and iliac vessels. 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 Reconstitution of the trifurcation vessels below the left knee with dominant runoff via a diseased posterior tibial artery. Mild to moderate disease throughout the right leg inflow and outflow vessels. Three-vessel runoff to the ankle with dominant flow via the posterior tibial artery. NON-VASCULAR Small bilateral pleural effusions. Cholelithiasis. Left hydronephrosis. Left ureter is dilated to the bladder. No visible obstructing stones. Prostate enlargement. Electronically Signed   By: Rolm Baptise M.D.   On: 04/12/2022 02:34   MR BRAIN WO CONTRAST  Result Date: 04/12/2022 CLINICAL DATA:  Initial evaluation for syncope/presyncope. EXAM: MRI HEAD WITHOUT CONTRAST TECHNIQUE: Multiplanar, multiecho pulse sequences of the brain and surrounding structures were obtained without intravenous contrast. COMPARISON:  None Available. FINDINGS: Brain: Diffuse prominence of the CSF containing spaces compatible generalized age-related cerebral atrophy. Patchy and confluent T2/FLAIR hyperintensity involving the periventricular deep white matter  both cerebral hemispheres as well as the pons, consistent with chronic small vessel ischemic disease, moderately advanced. No evidence for acute or subacute ischemia. Gray-white matter differentiation maintained. No areas of chronic cortical infarction. No acute or chronic intracranial blood products. No mass lesion, midline shift or mass effect. No hydrocephalus or extra-axial fluid collection. Pituitary gland and suprasellar region within normal limits. Vascular: Major intracranial vascular flow voids are maintained. Skull and upper cervical spine: Craniocervical junction within normal limits. Bone marrow signal intensity normal. No scalp soft tissue abnormality. Sinuses/Orbits: Prior ocular lens replacement on  the right. Globes and orbital soft tissues demonstrate no acute finding. Paranasal sinuses are clear. Trace bilateral mastoid effusions noted, of doubtful significance. Other: None. IMPRESSION: 1. No acute intracranial abnormality. 2. Age-related cerebral atrophy with moderately advanced chronic microvascular ischemic disease. Electronically Signed   By: Jeannine Boga M.D.   On: 04/12/2022 00:39   DG Ankle 2 Views Left  Result Date: 04/11/2022 CLINICAL DATA:  Multiple recent falls. EXAM: LEFT ANKLE - 2 VIEW COMPARISON:  None Available. FINDINGS: There is no evidence of an acute fracture or dislocation. A chronic deformity is seen involving the medial aspect of the left navicular bone, with chronic changes also noted along the left lateral malleolus and left medial malleolus. Mild degenerative changes are seen involving the left ankle, with moderate severity degenerative changes noted along the dorsal aspect of the proximal to mid left foot. Soft tissues are unremarkable. IMPRESSION: 1. No acute osseous abnormality. 2. Chronic and degenerative changes, as described above. Electronically Signed   By: Virgina Norfolk M.D.   On: 04/11/2022 20:15   DG Foot 2 Views Left  Result Date: 04/11/2022 CLINICAL DATA:  Pain after falls EXAM: LEFT FOOT - 2 VIEW COMPARISON:  None Available. FINDINGS: Calcaneal spurs. Mild dorsal spurring in the midfoot. No fracture or dislocation. Normal alignment. Normal mineralization. No radiodense foreign body. No subcutaneous gas. IMPRESSION: Degenerative changes as above. No acute findings. Electronically Signed   By: Lucrezia Europe M.D.   On: 04/11/2022 20:07   DG Hip Unilat W or Wo Pelvis 2-3 Views Left  Result Date: 04/11/2022 CLINICAL DATA:  Fall, pain. EXAM: DG HIP (WITH OR WITHOUT PELVIS) 2-3V LEFT COMPARISON:  None Available. FINDINGS: There is no evidence of hip fracture or dislocation. There is no evidence of arthropathy or other focal bone abnormality. Vascular  calcifications. IMPRESSION: No acute bony abnormality. Electronically Signed   By: Rolm Baptise M.D.   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  He maintains palpable pedal pulses.  Aggressive cardiovascular risk factor modification remains the mainstay of therapy moving forward including rate/rhythm control and anticoagulation with low-dose antiplatelet therapy.  Satisfactory for transition to oral medications and from ICU to stepdown from our standpoint.   LOS: 4 days   Bertram Savin 04/16/2022, 9:15 AM

## 2022-04-16 NOTE — Progress Notes (Signed)
Patient alert and oriented x3 with progressive intermittent confusion throughout shift.  MD made aware. BP stable, on room air. Family at bedside, per family this has been patient's baseline.

## 2022-04-16 NOTE — Consult Note (Signed)
Consultation Note Date: 04/16/2022   Patient Name: Curtis Wagner  DOB: 06/23/1940  MRN: [id number]  Age / Sex: 82 y.o., male  PCP: Patient, No Pcp Per Referring Physician: Sharen Hones, MD  Reason for Consultation: Establishing goals of care   HPI/Brief Hospital Course: 82 y.o. male  with past medical history of CAD s/p Impella assisted PCI, right atrial thrombus, persistent atrial fibrillation of Xarelto, DVT, HLD, HTN, PAD admitted from [facility] on 04/11/2022 with left lower extremity pain, numbness and multiple falls. Reports symptoms had been ongoing for several days. Reports hitting his head several times during multiple falls.  Found to have subacute thrombosis of the left SFA , s/p thrombectomy and subsequent stenting-likely cardioembolic occlusion on the left 3/20 and 3/21  Noted cardiomyopathy with EF 20% Has been declined for CABG in the past, he had decline LifeVest, ongoing medication noncompliance  Palliative medicine was consulted for assisting with goals of care conversations.  Subjective:  Extensive chart review has been completed prior to meeting patient including labs, vital signs, imaging, progress notes, orders, and available advanced directive documents from current and previous encounters.  Introduced myself as a Designer, jewellery as a member of the palliative care team. Explained palliative medicine is specialized medical care for people living with serious illness. It focuses on providing relief from the symptoms and stress of a serious illness. The goal is to improve quality of life for both the patient and the family.   Visited with Mr. Kepford at his bedside. Daughter-[name] present during time of visit. Alert and awake. Denies acute pain or discomfort.  Provides a brief life review. 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 He retired to [employer], [place] area and has been  living there the past 20 years. Recently moved back to [place] to be closer to his daughter and son. Moved into a home within [facility] independent living. Divorced x2. Has appointed daughter-[name] and son-Will as HCPOA.  Prior to admission, Mr. Henschen was able to perform all ADL's independently and was still able to drive. Enjoys exercising daily.  Confirmed DNR. Mentions he was under hospice services when he was living in [place]. Had issues with nurses scheduling visits. Prefers to be told in advanced the time they plan to visit. He agrees and expresses interest in resuming hospice services now that he is settled in [place].  TOC ordered placed to assist with communications with hospice liaison.  Symptom burden remains low at this time. Wishes to be able to return to his home within independent living.  I discussed importance of continued conversations with family/support persons and all members of their medical team regarding overall plan of care and treatment options ensuring decisions are in alignment with patients goals of care.  All questions/concerns addressed. Emotional support provided to patient/family/support persons. PMT will continue to follow and support patient as needed.  Objective: Primary Diagnoses: Present on Admission:  PAF (paroxysmal atrial fibrillation) ([place])  Hypertension  Type 2 diabetes mellitus with stage 3b chronic kidney disease, without long-term current use of insulin (HCC)  Bilateral pulmonary embolism (HCC)  Fall  Orthostatic hypotension  Myalgia  Absent pedal pulses   Physical Exam Constitutional:      General: He is not in acute distress.    Appearance: He is ill-appearing.  Pulmonary:     Effort: Pulmonary effort is normal. No respiratory distress.  Abdominal:     General: Abdomen is flat.  Palpations: Abdomen is soft.  Skin:    General: Skin is warm and dry.  Neurological:     Mental Status: He is alert.     Motor:  Weakness present.     Vital Signs: BP 100/75   Pulse 67   Temp (!) 97.5 F (36.4 C) (Oral)   Resp (!) 21   Ht 5\' 11"  (1.803 m)   Wt 83 kg   SpO2 90%   BMI 25.52 kg/m  Pain Scale: 0-10 POSS *See Group Information*: S-Acceptable,Sleep, easy to arouse Pain Score: 0-No pain   Palliative Assessment/Data: 60%   Assessment and Plan  SUMMARY OF RECOMMENDATIONS   DNR TOC order placed to assist with communicating with hospice liaison PMT to follow peripherally for ongoing needs and support  Thank you for this consult and allowing Palliative Medicine to participate in the care of Janann Colonel. Palliative medicine will continue to follow and assist as needed.   Time Total: 55 minutes  Time spent includes: Detailed review of medical records (labs, imaging, vital signs), medically appropriate exam (mental status, respiratory, cardiac, skin), discussed with treatment team, counseling and educating patient, family and staff, documenting clinical information, medication management and coordination of care.   Signed by: Theodoro Grist, DNP, AGNP-C Palliative Medicine    Please contact Palliative Medicine Team phone at [phone] for questions and concerns.  For individual provider: See Shea Evans

## 2022-04-16 NOTE — Hospital Course (Signed)
Curtis Wagner is a 82 year old male with history of arthritis, DVT, kidney stones, hyperlipidemia, hypertension, and PAF who was admitted to the hospital on 3/19 with complaints of left lower extremity pain, was found to have a left lower extremity ischemia.  CT angiogram showed occlusion of common femoral bifurcation of left SFA and the profunda.he superficial femoral artery and popliteal artery are occluded throughout their entire course. Occlusion at bifurcation consistent with likely embolic phenomenon.  Patient was seen by vascular surgery, thrombectomy was performed on 3/20, additional thrombectomy and drug-eluting stent was performed on 3/21. Patient also had left atrial thrombus on CT scan echocardiogram, seen by cardiology, anticoagulation was switched to Eliquis from Xarelto.  Echocardiogram also showed ejection fraction less than 20%, moderate pulmonary hypertension.

## 2022-04-16 NOTE — Progress Notes (Signed)
Progress Note   Patient: Aimee Koeppe [id number] DOB: [birth year] DOA: 04/11/2022     4 DOS: the patient was seen and examined on 04/16/2022   Brief hospital course: Aalijah Altamira is a 82 year old male with history of arthritis, DVT, kidney stones, hyperlipidemia, hypertension, and PAF who was admitted to the hospital on 3/19 with complaints of left lower extremity pain, was found to have a left lower extremity ischemia.  CT angiogram showed occlusion of common femoral bifurcation of left SFA and the profunda.he superficial femoral artery and popliteal artery are occluded throughout their entire course. Occlusion at bifurcation consistent with likely embolic phenomenon.  Patient was seen by vascular surgery, thrombectomy was performed on 3/20, additional thrombectomy and drug-eluting stent was performed on 3/21. Patient also had left atrial thrombus on CT scan echocardiogram, seen by cardiology, anticoagulation was switched to Eliquis from Xarelto.  Echocardiogram also showed ejection fraction less than 20%, moderate pulmonary hypertension.    Principal Problem:   Fall Active Problems:   Orthostatic hypotension   Absent pedal pulses   PAF (paroxysmal atrial fibrillation) (HCC)   Hypertension   AKI (acute kidney injury) ([place])   Bilateral pulmonary embolism (HCC)   Type 2 diabetes mellitus with stage 3b chronic kidney disease, without long-term current use of insulin (HCC)   Myalgia   PAD (peripheral artery disease) (HCC)   Acute lower limb ischemia   Left leg pain   Orthostatic dizziness   Coronary artery disease of native artery of native heart with stable angina pectoris ([place])   Dilated cardiomyopathy ([place])   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 with rest pain (HCC)   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 Left leg  ischemia. Patient is status post vascular intervention.  Condition has improved, continue anticoagulation and antiplatelet therapy. Patient is clinically stable, will transfer to cardiac unit.  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 Persistent atrial fibrillation. Nonsustained ventricular tachycardia. Coronary artery disease with elevated troponin. Left atrial thrombus. Patient has been seen by cardiology, due to new thrombosis, anticoagulation was switched from Xarelto to Eliquis. Currently he is euvolemic, no exacerbation of congestive heart failure.  Generalized weakness. Frequent falls. Start PT/OT.  Moderate protein calorie malnutrition. Patient appears to be malnourished, start protein supplement.  Chronic kidney disease stage IIIa. Acute kidney injury ruled out. Hypokalemia Hyponatremia. Conditions stable  Thrombocytopenia. Platelets has normalized.     Subjective:  Feels well today, denies any chest pain shortness of breath.  No leg pain.  Physical Exam: Vitals:   04/16/22 0600 04/16/22 0800 04/16/22 0900 04/16/22 1000  BP: 119/84 (!) 85/60 (!) 110/97 115/88  Pulse: 75     Resp: (!) 24 17 (!) 22 20  Temp:      TempSrc:      SpO2: 97%     Weight:      Height:       General exam: Appears calm and comfortable  Respiratory system: Clear to auscultation. Respiratory effort normal. Cardiovascular system: S1 & S2 heard, RRR. 2/6 systolic murmurs. No pedal edema. Gastrointestinal system: Abdomen is nondistended, soft and nontender. No organomegaly or masses felt. Normal bowel sounds heard. Central nervous system: Alert and oriented. No focal neurological deficits. Extremities: Symmetric 5 x 5 power. Skin: No rashes, lesions or ulcers Psychiatry: Judgement and insight appear normal. Mood &  affect appropriate.    Data Reviewed:  Review lab results, surgical  report, echocardiogram report, radiology reports.  Family Communication: daughter updated  Disposition: Status is: Inpatient Remains inpatient appropriate because: Severity of disease, status post multiple interventions.  Exploring discharge options.     Time spent: 60 minutes  Author: Sharen Hones, MD 04/16/2022 11:08 AM  For on call review [email or web address].

## 2022-04-16 NOTE — Progress Notes (Addendum)
Rounding Note    Patient Name: Curtis Wagner Date of Encounter: 04/16/2022  [place] Cardiologist: Followed at [facility]  cardiomyopathy EF 20% Appears unchanged from echo July 2022, March 2023, EF less than 20% Prior cardiac catheterization August 2022 at [facility] for CABG, successful left main IVUS guided DES 5 x 16 mm stent with Impella support, EF remained low, he declined LifeVest Briefly on hospice support.  By report from his team at [facility], hospice referral placed in [facility]   Asymptomatic, laying comfortably in bed Denies significant shortness of breath, no chest pain No significant leg swelling Telemetry reviewed, remains in atrial fibrillation, no significant ventricular tachycardi  Inpatient Medications    Scheduled Meds:  amiodarone  200 mg Oral BID   apixaban  5 mg Oral BID   atorvastatin  80 mg Oral Daily   Chlorhexidine Gluconate Cloth  6 each Topical Daily   clopidogrel  75 mg Oral Daily   empagliflozin  10 mg Oral Daily   feeding supplement  237 mL Oral BID BM   metoprolol succinate  25 mg Oral Daily   sodium chloride flush  3 mL Intravenous Q12H   Continuous Infusions:  magnesium sulfate bolus IVPB 2 g (04/16/22 1105)   PRN Meds: acetaminophen **OR** acetaminophen, HYDROmorphone (DILAUDID) injection, ondansetron (ZOFRAN) IV, oxyCODONE-acetaminophen, sodium chloride flush   Vital Signs    Vitals:   04/16/22 0900 04/16/22 1000 04/16/22 1052 04/16/22 1100  BP: (!) 110/97 115/88 113/77 93/76  Pulse:   75 73  Resp: (!) 22 20 19  (!) 24  Temp:      TempSrc:      SpO2:   97% 99%  Weight:      Height:        Intake/Output Summary (Last 24 hours) at 04/16/2022 1132 Last data filed at 04/16/2022 1100 Gross per 24 hour  Intake 963 ml  Output 2750 ml  Net -1787 ml      04/16/2022    2:34 AM 04/15/2022    4:00 AM 04/14/2022    1:20 AM  Last 3 Weights  Weight (lbs) 182 lb 15.7 oz 184 lb 8.4 oz 184 lb  1.4 oz  Weight (kg) 83 kg 83.7 kg 83.5 kg      Telemetry    Atrial fibrillation rate 70- Personally Reviewed  ECG     - Personally Reviewed  Physical Exam   GEN: No acute distress.   Neck: No JVD Cardiac: Irregularly irregular, no murmurs, rubs, or gallops.  Respiratory: Clear to auscultation bilaterally. GI: Soft, nontender, non-distended  MS: No edema; No deformity. Neuro:  Nonfocal  Psych: Normal affect   Labs    High Sensitivity Troponin:   Recent Labs  Lab 04/11/22 2048 04/11/22 2135  TROPONINIHS 20* 22*     Chemistry Recent Labs  Lab 04/14/22 0302 04/15/22 0402 04/16/22 0419  NA 138 133* 135  K 4.2 3.3* 4.2  CL 105 106 101  CO2 21* 21* 24  GLUCOSE 122* 128* 105*  BUN 20 17 16   CREATININE 1.40* 1.31* 1.44*  CALCIUM 8.3* 7.8* 8.4*  MG 1.8 2.1 1.8  PROT 5.4* 5.3* 6.4*  ALBUMIN 2.6* 2.6* 3.0*  AST 29 30 32  ALT 14 12 14   ALKPHOS 106 104 109  BILITOT 1.1 1.1 1.4*  GFRNONAA 50* 54* 48*  ANIONGAP 12 6 10     Lipids No results for input(s): "CHOL", "TRIG", "HDL", "LABVLDL", "LDLCALC", "CHOLHDL" in the last 168 hours.  Hematology Recent Labs  Lab 04/14/22 0302 04/15/22 0402 04/16/22 0419  WBC 5.2 5.1 5.7  RBC 3.86* 3.78* 4.49  HGB 12.6* 12.4* 14.7  HCT 38.3* 37.0* 44.7  MCV 99.2 97.9 99.6  MCH 32.6 32.8 32.7  MCHC 32.9 33.5 32.9  RDW 13.2 13.2 13.4  PLT 121* 115* 170   Thyroid  Recent Labs  Lab 04/11/22 2048  TSH 2.204    BNPNo results for input(s): "BNP", "PROBNP" in the last 168 hours.  DDimer  Recent Labs  Lab 04/12/22 0015  DDIMER 12.51*     Radiology    MR CARDIAC MORPHOLOGY W WO CONTRAST  Result Date: 04/14/2022 CLINICAL DATA:  LA mass, cardiomyopathy EXAM: CARDIAC MRI TECHNIQUE: The patient was scanned on a 1.5 Tesla Siemens magnet. A dedicated cardiac coil was used. Functional imaging was done using Fiesta sequences. 2,3, and 4 chamber views were done to assess for RWMA's. Modified Simpson's rule using a short axis stack  was used to calculate an ejection fraction on a dedicated work Conservation officer, nature. The patient received 11 cc of Gadavist. After 10 minutes inversion recovery sequences were used to assess for infiltration and scar tissue. Velocity flow mapping performed in the ascending aorta and main pulmonary artery. CONTRAST:  11 cc  of Gadavist FINDINGS: 1. Moderately dilated LV size, severely reduced systolic function (LVEF = 18%). Normal LV wall thickness. There is global hypokinesis with no regional wall motion abnormalities. There is no late gadolinium enhancement in the left ventricular myocardium. Mildly elevated LV native T1 value of 1046 ms (normal <1000) LV ECV 32% (normal <30%) LVEDV: 291 ml LVESV: 240 ml SV: 51 ml CO: 4 L/min Myocardial mass: 150 g 2. Normal right ventricular size and thickness. Mildly reduced RV systolic function (RVEF = 39%). 3. Severely dilated left atrium, mildly dilated right atrium. There is a 31 x 22 mm mass in the superior and posterior aspect of the left atrium most consistent with a thrombus. 4. Normal size of the aortic root, ascending aorta and pulmonary artery. 5.  Mild MR, mild AI. 6.  Normal pericardium.  No pericardial effusion. IMPRESSION: 1. Moderately dilated LV, severely reduced LV systolic function. LVEF 18%. 2. There is a 31 x 22 mm mass in the superior and posterior aspect of the left atrium most consistent with a thrombus. 3. There is no late gadolinium enhancement in the left ventricular myocardium. 4. There is no evidence for myocardial infiltration. 5. Mildly reduced RV function. 6. Findings consistent with dilated nonischemic cardiomyopathy. Electronically Signed   By: Kate Sable M.D.   On: 04/14/2022 17:53   MR CARDIAC VELOCITY FLOW MAP  Result Date: 04/14/2022 CLINICAL DATA:  LA mass, cardiomyopathy EXAM: CARDIAC MRI TECHNIQUE: The patient was scanned on a 1.5 Tesla Siemens magnet. A dedicated cardiac coil was used. Functional imaging was done  using Fiesta sequences. 2,3, and 4 chamber views were done to assess for RWMA's. Modified Simpson's rule using a short axis stack was used to calculate an ejection fraction on a dedicated work Conservation officer, nature. The patient received 11 cc of Gadavist. After 10 minutes inversion recovery sequences were used to assess for infiltration and scar tissue. Velocity flow mapping performed in the ascending aorta and main pulmonary artery. CONTRAST:  11 cc  of Gadavist FINDINGS: 1. Moderately dilated LV size, severely reduced systolic function (LVEF = 18%). Normal LV wall thickness. There is global hypokinesis with no regional wall motion abnormalities. There is no late gadolinium enhancement  in the left ventricular myocardium. Mildly elevated LV native T1 value of 1046 ms (normal <1000) LV ECV 32% (normal <30%) LVEDV: 291 ml LVESV: 240 ml SV: 51 ml CO: 4 L/min Myocardial mass: 150 g 2. Normal right ventricular size and thickness. Mildly reduced RV systolic function (RVEF = 39%). 3. Severely dilated left atrium, mildly dilated right atrium. There is a 31 x 22 mm mass in the superior and posterior aspect of the left atrium most consistent with a thrombus. 4. Normal size of the aortic root, ascending aorta and pulmonary artery. 5.  Mild MR, mild AI. 6.  Normal pericardium.  No pericardial effusion. IMPRESSION: 1. Moderately dilated LV, severely reduced LV systolic function. LVEF 18%. 2. There is a 31 x 22 mm mass in the superior and posterior aspect of the left atrium most consistent with a thrombus. 3. There is no late gadolinium enhancement in the left ventricular myocardium. 4. There is no evidence for myocardial infiltration. 5. Mildly reduced RV function. 6. Findings consistent with dilated nonischemic cardiomyopathy. Electronically Signed   By: Kate Sable M.D.   On: 04/14/2022 17:53    Cardiac Studies   Echo Left ventricular ejection fraction, by estimation, is <20%. The left  ventricle has  severely decreased function. The left ventricle demonstrates  global hypokinesis. The left ventricular internal cavity size was mildly  dilated. Left ventricular  diastolic parameters are indeterminate.   2. Right ventricular systolic function is low normal. The right  ventricular size is normal. There is moderately elevated pulmonary artery  systolic pressure. The estimated right ventricular systolic pressure is  0000000 mmHg.   3. There is a 3 x 2.5 cm mass attached to the superior-posterior wall of  the left atrium. Differential includes tumor (myxoma) vs thrombus- less  likely. consider CMR for better characterization.. Left atrial size was  mildly dilated.   4. Right atrial size was moderately dilated.   5. The mitral valve is normal in structure. Mild mitral valve  regurgitation.   6. The aortic valve is tricuspid. Aortic valve regurgitation is mild.  Aortic valve sclerosis/calcification is present, without any evidence of  aortic stenosis.   7. The inferior vena cava is dilated in size with <50% respiratory  variability, suggesting right atrial pressure of 15 mmHg.   Patient Profile     Mr. Syzdek is a 82 year old male with history of coronary artery disease status post Impella assisted PCI to the left main in 2022, right atrial thrombus, persistent atrial fibrillation on Xarelto, history of DVT with bilateral pulmonary embolisms 1 year ago due to noncompliance with anticoagulation, hypertension, hyperlipidemia, peripheral arterial disease and likely dementia currently admitted to [facility] with a several day history of left lower extremity numbness and pain found to be secondary to subacute thrombosis of the left SFA and profunda.   Assessment & Plan    Left lower extremity ischemia with pain Presenting with ischemic leg, concern for cardioembolic phenomenon Intervention over several days this admission with lysis, thrombectomy, stenting proximal left SFA, left distal  SFA, popliteal  artery Continue Plavix with Eliquis 5 twice daily Lipitor 80 daily   Left atrial thrombus Noted on CT scan, opacity noted on surface echo cardiac MRI suggesting thrombus On Eliquis 5 twice daily After discussion with patient's family, decision made not to transition to warfarin as he lives alone, transportation issues, prior history of medication compliance issues -Daughter who lives in town will assist with medication compliance   Persistent atrial fibrillation Rate controlled, continue  Eliquis 5 twice daily,  No plan for cardioversion given left atrial thrombus   Dilated cardiomyopathy In the setting of cardiomyopathy EF 20% Appears unchanged from echo July 2022, March 2023, EF less than 20% Prior cardiac catheterization August 2022 at [facility] for CABG, successful left main IVUS guided DES 5 x 16 mm stent with Impella support continue metoprolol succinate 25 daily, Jardiance 10 Given outpatient symptoms of orthostasis/dizziness, will hold off on restart of spironolactone.  Systolic pressures periodically in the 90 range   Nonsustained VT Restarted on amiodarone 200 daily with titration up to twice daily given nonsustained VT on telemetry Continue metoprolol succinate 25 No significant VT in the past 24 hours   Coronary artery disease, elevated troponin History of Impella supported DES to the left main as he was too high for CT surgery Previously referred to hospice but he got better On Plavix with Eliquis 5 twice daily as above Continue Lipitor   History of bilateral PEs Prior history noncompliance with his Xarelto At that time was treated with high-dose Xarelto March 2023 -He will continue Eliquis 5 twice daily for reasons as detailed above   Multiple falls Documented by primary care on recent visit April 11, 2022 Notes indicating 7 falls in the past 3 weeks Possibly related to PAD , ischemic leg Will need rehab, PT   Protein calorie malnutrition,   albumin 2.8 Independent living, family to assist with meals  Dispo Consider palliative care consultation Previously on hospice through [facility]    Total encounter time more than 50 minutes  Greater than 50% was spent in counseling and coordination of care with the patient   For questions or updates, please contact [place] Please consult [email or web address] for contact info under        Signed, Ida Rogue, MD  04/16/2022, 11:32 AM

## 2022-04-16 NOTE — Consult Note (Signed)
PHARMACY CONSULT NOTE - FOLLOW UP  Pharmacy Consult for Electrolyte Monitoring and Replacement   Recent Labs: Potassium (mmol/L)  Date Value  04/16/2022 4.2   Magnesium (mg/dL)  Date Value  04/16/2022 1.8   Calcium (mg/dL)  Date Value  04/16/2022 8.4 (L)   Albumin (g/dL)  Date Value  04/16/2022 3.0 (L)   Phosphorus (mg/dL)  Date Value  04/16/2022 3.4   Sodium (mmol/L)  Date Value  04/16/2022 135  06/16/2016 143     Assessment: 82 yo M with pertinent PMH HFrEF, pAF presents with limb ischemia now s/p thrombectomy. Pharmacy consulted to manage electrolytes while pt in CCU. Pt is on amio. IV lasix x 2 doses on 3/23.   Goal of Therapy:  K >/= 4.0 and Mg >/= 2.0  Plan:  Mg 2 g IV x 1.  F/u with AM labs.   Curtis Wagner ,PharmD Clinical Pharmacist 04/16/2022 8:37 AM

## 2022-04-16 NOTE — TOC Progression Note (Signed)
Transition of Care [facility]) - Progression Note    Patient Details  Name: Curtis Wagner MRN: [id number] Date of Birth: [birth year]  Transition of Care [facility]) CM/SW [place], LCSW Phone Number: 04/16/2022, 9:53 AM  Clinical Narrative:  Called daughter and provided update regarding home health. Patient just moved here from [place], [place] and has not been established with a PCP yet. Daughter called a local provider who could not get him in until August. Daughter is agreeable to appointment with [facility] since they can get patients in quickly. She said patient would likely prefer male provider and morning appointment. Will schedule once closer to discharge. [facility] liaison is aware. Daughter understands that home health will not be able to start until care is established. DME recommendation for RW but daughter stated he already has one.   Expected Discharge Plan: [place] Barriers to Discharge: Continued Medical Work up  Expected Discharge Plan and Services       Living arrangements for the past 2 months: Single Family Home                           HH Arranged: PT, RN [facility] Agency: [place] Date [place]: 04/15/22 Time [place]: 59 Representative spoke with at [place]: [place] Determinants of Health ([place]) Interventions SDOH Screenings   Food Insecurity: No Food Insecurity (04/12/2022)  Housing: Low Risk  (04/12/2022)  Transportation Needs: No Transportation Needs (04/12/2022)  Utilities: Not At Risk (04/12/2022)  Tobacco Use: Medium Risk (04/14/2022)    Readmission Risk Interventions     No data to display

## 2022-04-16 NOTE — Progress Notes (Signed)
PT Cancellation Note  Patient Details Name: Curtis Wagner MRN: [id number] DOB: October 23, 1940   Cancelled Treatment:    Reason Eval/Treat Not Completed: Other (comment)  Pt in bed.  Stating he was up earlier today walking in hallways with staff without AD.  Discussed with RN who stated he has not been out of bed.  He does say he is uncomfortable because of "all the people here" that he does not know.  Returned to room and encouraged OOB for lunch and he again refuses stating he does not feel up to it.  Despite different approaches he continues to decline.  Cognition does seem to be a barrier.  Per chart pt does still drive which is a concern.  Recommendations at eval were for HHPT.  Will attempt to see pt again tomorrow and adjust recommendations as appropriate based on performance.  Pt does seem at risk for needing SNF to return to baseline mobility and cognition if he does not progress.  Chesley Noon 04/16/2022, 1:24 PM

## 2022-04-17 DIAGNOSIS — I480 Paroxysmal atrial fibrillation: Secondary | ICD-10-CM | POA: Diagnosis not present

## 2022-04-17 DIAGNOSIS — Z7901 Long term (current) use of anticoagulants: Secondary | ICD-10-CM | POA: Diagnosis not present

## 2022-04-17 DIAGNOSIS — I5022 Chronic systolic (congestive) heart failure: Secondary | ICD-10-CM

## 2022-04-17 DIAGNOSIS — I25118 Atherosclerotic heart disease of native coronary artery with other forms of angina pectoris: Secondary | ICD-10-CM | POA: Diagnosis not present

## 2022-04-17 DIAGNOSIS — I998 Other disorder of circulatory system: Secondary | ICD-10-CM | POA: Diagnosis not present

## 2022-04-17 DIAGNOSIS — Z95828 Presence of other vascular implants and grafts: Secondary | ICD-10-CM | POA: Diagnosis not present

## 2022-04-17 DIAGNOSIS — I743 Embolism and thrombosis of arteries of the lower extremities: Secondary | ICD-10-CM | POA: Diagnosis not present

## 2022-04-17 DIAGNOSIS — I70222 Atherosclerosis of native arteries of extremities with rest pain, left leg: Secondary | ICD-10-CM | POA: Diagnosis not present

## 2022-04-17 LAB — BASIC METABOLIC PANEL
Anion gap: 9 (ref 5–15)
BUN: 23 mg/dL (ref 8–23)
CO2: 21 mmol/L — ABNORMAL LOW (ref 22–32)
Calcium: 8.2 mg/dL — ABNORMAL LOW (ref 8.9–10.3)
Chloride: 102 mmol/L (ref 98–111)
Creatinine, Ser: 1.39 mg/dL — ABNORMAL HIGH (ref 0.61–1.24)
GFR, Estimated: 50 mL/min — ABNORMAL LOW (ref >60)
Glucose, Bld: 107 mg/dL — ABNORMAL HIGH (ref 70–99)
Potassium: 3.7 mmol/L (ref 3.5–5.1)
Sodium: 132 mmol/L — ABNORMAL LOW (ref 135–145)

## 2022-04-17 LAB — MAGNESIUM: Magnesium: 2.3 mg/dL (ref 1.7–2.4)

## 2022-04-17 LAB — PHOSPHORUS: Phosphorus: 3.6 mg/dL (ref 2.5–4.6)

## 2022-04-17 MED ORDER — AMIODARONE HCL 200 MG PO TABS: 200.0000 mg | ORAL_TABLET | Freq: Two times a day (BID) | ORAL | 0 refills | Status: DC

## 2022-04-17 MED ORDER — CLOPIDOGREL BISULFATE 75 MG PO TABS: 75.0000 mg | ORAL_TABLET | Freq: Every day | ORAL | 0 refills | Status: DC

## 2022-04-17 MED ORDER — METOPROLOL SUCCINATE ER 25 MG PO TB24: 25.0000 mg | ORAL_TABLET | Freq: Every day | ORAL | 0 refills | Status: DC

## 2022-04-17 MED ORDER — APIXABAN 5 MG PO TABS: 5.0000 mg | ORAL_TABLET | Freq: Two times a day (BID) | ORAL | 0 refills | Status: DC

## 2022-04-17 MED ORDER — SODIUM BICARBONATE 650 MG PO TABS: 650.0000 mg | ORAL_TABLET | Freq: Two times a day (BID) | ORAL | 0 refills | Status: DC

## 2022-04-17 NOTE — Progress Notes (Signed)
Physical Therapy Treatment Patient Details Name: Curtis Wagner MRN: [id number] DOB: October 10, 1940 Today's Date: 04/17/2022   History of Present Illness Curtis Wagner is an 82 y.o. male with falls. Pt also has complaints of left leg numbness and weakness.Reports dizziness worse with standing that occurs with standing and then he falls. Daughter at bedside is a Marine scientist. He recently moved in and independent living facility.  Extensive heart and vascular history. Also reports of myalgia. NO chest pain, No sob or palpitation.  Patient does not report any changes in vision or speech or nausea vomiting diarrhea abdominal pain.  No fevers chills no seizures.  No bleeding or melena. Patient is s/p  Left lower extremity angiogram through existing catheters 2.  Mechanical thrombectomy to the left profunda femoris artery with the penumbra CAT 7D catheter 3.  Stent placement to the proximal left SFA with 8 mm diameter by 5 cm length Viabahn stent  4.  Stent placement to the distal left SFA and the majority of the popliteal artery with 7 mm diameter by 10 cm length and 8 mm diameter by 10 cm length Viabahn stents   5.  StarClose closure device right femoral artery.    PT Comments    Pt received supine in bed only agreeable to PT with encouragement. Pt mod-I exiting bed, able to don his own socks in supine prior to sitting up. Pt requiring VC's for STS to RW but performs STS and gait at supervision level ~140' with step through pattern and safe sequencing with turns. HR generally ranging from mid 70's at rest to mid 80's with gait with intermittent PVC's on tele which is improved from episodes of V-tach last PT treatment session. Continue to Recommend f/u services at discharge to optimize safety awareness and reduced falls risk. Pt in recliner with all needs in reach.     Recommendations for follow up therapy are one component of a multi-disciplinary discharge planning process, led by the attending physician.   Recommendations may be updated based on patient status, additional functional criteria and insurance authorization.  Follow Up Recommendations       Assistance Recommended at Discharge Frequent or constant Supervision/Assistance  Patient can return home with the following A little help with walking and/or transfers;A little help with bathing/dressing/bathroom;Assistance with cooking/housework;Assist for transportation;Direct supervision/assist for medications management;Help with stairs or ramp for entrance   Equipment Recommendations  Rolling Leap (2 wheels)    Recommendations for Other Services       Precautions / Restrictions Precautions Precautions: Fall Restrictions Weight Bearing Restrictions: No     Mobility  Bed Mobility Overal bed mobility: Modified Independent               Patient Response: Cooperative  Transfers Overall transfer level: Needs assistance Equipment used: Rolling Ferrie (2 wheels) Transfers: Sit to/from Stand Sit to Stand: Supervision           General transfer comment: VC's for hand placement.    Ambulation/Gait Ambulation/Gait assistance: Supervision Gait Distance (Feet): 140 Feet Assistive device: Rolling Melle (2 wheels) Gait Pattern/deviations: Step-through pattern, Decreased step length - right, Decreased step length - left, Decreased weight shift to left           Stairs             Wheelchair Mobility    Modified Rankin (Stroke Patients Only)       Balance Overall balance assessment: Needs assistance Sitting-balance support: Feet supported Sitting balance-Leahy Scale: Good     Standing  balance support: Bilateral upper extremity supported, During functional activity Standing balance-Leahy Scale: Fair                              Cognition Arousal/Alertness: Awake/alert Behavior During Therapy: WFL for tasks assessed/performed Overall Cognitive Status: Within Functional Limits for tasks  assessed                                          Exercises      General Comments General comments (skin integrity, edema, etc.): HR more stable this date. Maintaining in mid 70's to low 80's. Intermittent PVC's.      Pertinent Vitals/Pain Pain Assessment Pain Assessment: Faces Faces Pain Scale: Hurts a little bit Pain Location: LLE Pain Descriptors / Indicators: Discomfort, Tightness, Sore Pain Intervention(s): Limited activity within patient's tolerance    Home Living                          Prior Function            PT Goals (current goals can now be found in the care plan section) Acute Rehab PT Goals Patient Stated Goal: to return to Independent living with family assist and HHPT PT Goal Formulation: With patient/family Time For Goal Achievement: 04/21/22 Potential to Achieve Goals: Good Progress towards PT goals: Progressing toward goals    Frequency    Min 2X/week      PT Plan Current plan remains appropriate    Co-evaluation              AM-PAC PT "6 Clicks" Mobility   Outcome Measure  Help needed turning from your back to your side while in a flat bed without using bedrails?: None Help needed moving from lying on your back to sitting on the side of a flat bed without using bedrails?: None Help needed moving to and from a bed to a chair (including a wheelchair)?: A Little Help needed standing up from a chair using your arms (e.g., wheelchair or bedside chair)?: A Little Help needed to walk in hospital room?: A Little Help needed climbing 3-5 steps with a railing? : A Lot 6 Click Score: 19    End of Session Equipment Utilized During Treatment: Gait belt Activity Tolerance: Patient tolerated treatment well Patient left: in chair;with call bell/phone within reach Nurse Communication: Mobility status PT Visit Diagnosis: Unsteadiness on feet (R26.81);Other abnormalities of gait and mobility (R26.89);Muscle weakness  (generalized) (M62.81);Pain;Difficulty in walking, not elsewhere classified (R26.2);History of falling (Z91.81) Pain - Right/Left: Left Pain - part of body: Leg     Time: [id number] PT Time Calculation (min) (ACUTE ONLY): 16 min  Charges:  $Therapeutic Exercise: 8-22 mins                    Jerone Cudmore M. Fairly IV, PT, DPT Physical Therapist- [facility]  04/17/2022, 11:39 AM

## 2022-04-17 NOTE — Progress Notes (Signed)
Manufacturing systems engineer Liaison Note  New Referral received for hospice services at home upon discharge from [facility].  Spoke with patient at the bedside.  No family present.  Patient is awake and alert, but pleasantly confused.    Spoke on the phone with Elmore Guise, patient's daughter and HCPOA.  She states that PT is recommending home health for PT now and they would like to pursue this path at discharge.    She states Mr. Fronheiser had hospice when he lived in [place], [place].  Moved here several weeks ago.  She verbalizes understanding of contacting myself or [facility] to initiate hospice services when discharged from home health if they wish.  Thank you for allowing participation in this patient's care.  Dimas Aguas, RN Nurse Liaision [phone]

## 2022-04-17 NOTE — Progress Notes (Signed)
Rounding Note    Patient Name: Curtis Wagner Date of Encounter: 04/17/2022  [facility] Cardiologist: None  Followed by Osborne Oman Subjective   Patient seen on AM rounds. Denies any shortness of breath or chest pain. No over night events recorded. Remains in atrial fibrillation that is rate controlled. No bleeding noted with stable hgb 14.7 on apixaban.   Inpatient Medications    Scheduled Meds:  amiodarone  200 mg Oral BID   apixaban  5 mg Oral BID   atorvastatin  80 mg Oral Daily   Chlorhexidine Gluconate Cloth  6 each Topical Daily   clopidogrel  75 mg Oral Daily   empagliflozin  10 mg Oral Daily   feeding supplement  237 mL Oral BID BM   metoprolol succinate  25 mg Oral Daily   sodium chloride flush  3 mL Intravenous Q12H   Continuous Infusions:  PRN Meds: acetaminophen **OR** acetaminophen, HYDROmorphone (DILAUDID) injection, ondansetron (ZOFRAN) IV, oxyCODONE-acetaminophen, sodium chloride flush   Vital Signs    Vitals:   04/17/22 0730 04/17/22 0800 04/17/22 1000 04/17/22 1053  BP:  122/78 91/78 104/78  Pulse: 79 69 78 70  Resp: (!) 25 (!) 21 (!) 23   Temp:  (!) 97.4 F (36.3 C)    TempSrc:  Oral    SpO2: 100% 99% 95%   Weight:      Height:        Intake/Output Summary (Last 24 hours) at 04/17/2022 1148 Last data filed at 04/17/2022 0600 Gross per 24 hour  Intake 966 ml  Output 325 ml  Net 641 ml      04/17/2022    2:06 AM 04/16/2022    2:34 AM 04/15/2022    4:00 AM  Last 3 Weights  Weight (lbs) 182 lb 8.7 oz 182 lb 15.7 oz 184 lb 8.4 oz  Weight (kg) 82.8 kg 83 kg 83.7 kg      Telemetry    Rate controlled atrial fibrillation rates in the 70's - Personally Reviewed  ECG    No new tracings - Personally Reviewed  Physical Exam   GEN: No acute distress. Eating breakfast watching TV. Neck: No JVD Cardiac: irregularly irregular, no murmurs, rubs, or gallops.  Respiratory: Clear to auscultation bilaterally. Respirations remain unlabored  on room air.  GI: Soft, nontender, BS + x 4 quadrants, non-distended  MS: 1+ pitting edema to BLE; No deformity. Neuro:  Some bouts of confusion noted by nursing overnight Psych: Normal affect   Labs    High Sensitivity Troponin:   Recent Labs  Lab 04/11/22 2048 04/11/22 2135  TROPONINIHS 20* 22*     Chemistry Recent Labs  Lab 04/14/22 0302 04/15/22 0402 04/16/22 0419 04/17/22 0321  NA 138 133* 135 132*  K 4.2 3.3* 4.2 3.7  CL 105 106 101 102  CO2 21* 21* 24 21*  GLUCOSE 122* 128* 105* 107*  BUN 20 17 16 23   CREATININE 1.40* 1.31* 1.44* 1.39*  CALCIUM 8.3* 7.8* 8.4* 8.2*  MG 1.8 2.1 1.8 2.3  PROT 5.4* 5.3* 6.4*  --   ALBUMIN 2.6* 2.6* 3.0*  --   AST 29 30 32  --   ALT 14 12 14   --   ALKPHOS 106 104 109  --   BILITOT 1.1 1.1 1.4*  --   GFRNONAA 50* 54* 48* 50*  ANIONGAP 12 6 10 9     Lipids No results for input(s): "CHOL", "TRIG", "HDL", "LABVLDL", "LDLCALC", "CHOLHDL" in the last 168 hours.  Hematology Recent Labs  Lab 04/14/22 0302 04/15/22 0402 04/16/22 0419  WBC 5.2 5.1 5.7  RBC 3.86* 3.78* 4.49  HGB 12.6* 12.4* 14.7  HCT 38.3* 37.0* 44.7  MCV 99.2 97.9 99.6  MCH 32.6 32.8 32.7  MCHC 32.9 33.5 32.9  RDW 13.2 13.2 13.4  PLT 121* 115* 170   Thyroid  Recent Labs  Lab 04/11/22 2048  TSH 2.204    BNPNo results for input(s): "BNP", "PROBNP" in the last 168 hours.  DDimer  Recent Labs  Lab 04/12/22 0015  DDIMER 12.51*     Radiology    No results found.  Cardiac Studies  Cardiac MRI 04/14/22 1. Moderately dilated LV, severely reduced LV systolic function. LVEF 18%.   2. There is a 31 x 22 mm mass in the superior and posterior aspect of the left atrium most consistent with a thrombus.   3. There is no late gadolinium enhancement in the left ventricular myocardium.   4. There is no evidence for myocardial infiltration.   5. Mildly reduced RV function.   6. Findings consistent with dilated nonischemic cardiomyopathy.  TTE 04/12/22 Left  ventricular ejection fraction, by estimation, is <20%. The left  ventricle has severely decreased function. The left ventricle demonstrates  global hypokinesis. The left ventricular internal cavity size was mildly  dilated. Left ventricular  diastolic parameters are indeterminate.   2. Right ventricular systolic function is low normal. The right  ventricular size is normal. There is moderately elevated pulmonary artery  systolic pressure. The estimated right ventricular systolic pressure is  0000000 mmHg.   3. There is a 3 x 2.5 cm mass attached to the superior-posterior wall of  the left atrium. Differential includes tumor (myxoma) vs thrombus- less  likely. consider CMR for better characterization.. Left atrial size was  mildly dilated.   4. Right atrial size was moderately dilated.   5. The mitral valve is normal in structure. Mild mitral valve  regurgitation.   6. The aortic valve is tricuspid. Aortic valve regurgitation is mild.  Aortic valve sclerosis/calcification is present, without any evidence of  aortic stenosis.   7. The inferior vena cava is dilated in size with <50% respiratory  variability, suggesting right atrial pressure of 15 mmHg.   Patient Profile     82 y.o. male with a history of coronary artery disease status post Impella assisted PCI to left main in 2022, right atrial thrombus, persistent atrial fibrillation on chronic anticoagulation with rivaroxaban, history of DVT with bilateral pulmonary embolism 1 year ago due to noncompliance with anticoagulation, hypertension, hyperlipidemia, peripheral arterial disease, and likely dementia who is being seen and evaluated after several day history of lower extremity numbness and pain found to be secondary to subacute thrombosis of the left SFA and profunda.  Assessment & Plan    Left lower extremity ischemia with pain -He presented with an ischemic leg concerning for cardioembolic phenomenon -Intervention over several days  this admission with lysis, thrombectomy, stenting of the proximal left SFA, left distal SFA, and popliteal artery -Continued on clopidogrel apixaban and statin therapy -Continues to be followed by vascular  Left atrial thrombus -Noted on echocardiogram, CT scan, and cardiac MRI -Continue on anticoagulant -Previous discussion had with patient's family about transitioning to warfarin as he lives alone and has transportation issues with concern for medication noncompliance warfarin is not the best option for him at this time.  Persistent atrial fibrillation -Remains in rate controlled atrial fibrillation on bedside cardiac monitoring -Continued on apixaban  5 mg twice daily for stroke prophylaxis -CHA2DS2-VASc score of at least 5 -No plan for cardioversion given left atrial thrombus -Continue on beta-blocker therapy -Currently on amiodarone 200 mg twice daily  Dilated cardiomyopathy -Echocardiogram completed on 04/12/2022 revealed an LVEF of less than 20% -Appears unchanged from prior echocardiograms done July 2022, and March 2023 -Prior cardiac catheterization completed in August 2022 at [facility] -At that time he had declined CABG and had successful left main IVUS guided DES with Impella support -Complaints of dizziness with falls prior to admission, recommending holding reinitiation of spironolactone -Continue on Toprol and Jardiance  Nonsustained ventricular tachycardia -Continued on amiodarone 200 mg twice daily -Continued on metoprolol succinate 25 mg -No significant episodes of ventricular tachycardia noted in the last 24 hours -Continue with cardiac monitoring  Coronary artery disease with elevated high-sensitivity troponin -History of Impella supported DES to the left main that was too high for CT surgery -Previously referred to hospice but then improved -Has been continued on clopidogrel and atorvastatin -Remains chest pain-free -EKG as needed for pain or  changes  History of bilateral PEs and DVT -Prior history of noncompliance with anticoagulant -Previously had been treated with high-dose Xarelto March 2023 -Has now been placed on apixaban 5 mg twice daily      CHA2DS2-VASc Score = 5   This indicates a 7.2% annual risk of stroke. The patient's score is based upon: CHF History: 1 HTN History: 1 Diabetes History: 0 Stroke History: 0 Vascular Disease History: 1 Age Score: 2 Gender Score: 0     For questions or updates, please contact [place] Please consult [email or web address] for contact info under        Signed, Takeshia Wenk, NP  04/17/2022, 11:48 AM

## 2022-04-17 NOTE — Progress Notes (Signed)
Patient alert to self and time, stable pressures, on room air. Patient with increasing confusion throughout day. MD made aware. Discussed confusion with patients daughter. Patient education, discharge instructions and medication instructions provided to patient and daughter. Stent card and belongings given to daughter.

## 2022-04-17 NOTE — Progress Notes (Signed)
Progress Note    04/17/2022 10:55 AM 4 Days Post-Op  Subjective: Curtis Wagner is an 82 year old male who is now status post thrombolysis with thrombectomy and subsequent stenting for ischemia of his left lower extremity.  This morning on exam he is resting comfortably in bed.  His left lower extremity remains edematous.  Still has strong palpable pedal pulses.  Patient's vitals all remained stable.  Patient had no complaints overnight.  Nursing had a question related to pupil size and reaction.  Upon exam with an high-powered flashlight patient's pupils are 3 mm and reactive to bilaterally to light exam.  No neurologic deficit noted.   Vitals:   04/17/22 0800 04/17/22 1000  BP: 122/78 91/78  Pulse: 69 78  Resp: (!) 21 (!) 23  Temp: (!) 97.4 F (36.3 C)   SpO2: 99% 95%   Physical Exam: Cardiac:  Irregular Rhythm, HX Afib rate controlled in 70's. S1&S2 heard, 2/6 systolic murmurs. Positive pedal edema +2 on left.  Lungs: Normal respiratory effort, clear on auscultation but diminished in the bases bilaterally. Incisions: Right groin incision with dressing clean, dry, intact.  No hematoma or seroma to note. Extremities: Positive strong palpable dorsalis pedis pulse on the left with +1 edema to his ankle. Abdomen: Positive bowel sounds all 4 quadrants, soft, nontender, nondistended. Neurologic: Alert and oriented x 3.  Follows all commands appropriately.  CBC    Component Value Date/Time   WBC 5.7 04/16/2022 0419   RBC 4.49 04/16/2022 0419   HGB 14.7 04/16/2022 0419   HGB 15.4 06/16/2016 1538   HCT 44.7 04/16/2022 0419   HCT 44.5 06/16/2016 1538   PLT 170 04/16/2022 0419   PLT 236 06/16/2016 1538   MCV 99.6 04/16/2022 0419   MCV 94 06/16/2016 1538   MCH 32.7 04/16/2022 0419   MCHC 32.9 04/16/2022 0419   RDW 13.4 04/16/2022 0419   RDW 14.4 06/16/2016 1538   LYMPHSABS 1.8 12/31/2014 1022   MONOABS 0.5 12/31/2014 1022   EOSABS 0.2 12/31/2014 1022   BASOSABS 0.1 12/31/2014  1022    BMET    Component Value Date/Time   NA 132 (L) 04/17/2022 0321   NA 143 06/16/2016 1538   K 3.7 04/17/2022 0321   CL 102 04/17/2022 0321   CO2 21 (L) 04/17/2022 0321   GLUCOSE 107 (H) 04/17/2022 0321   BUN 23 04/17/2022 0321   BUN 18 06/16/2016 1538   CREATININE 1.39 (H) 04/17/2022 0321   CALCIUM 8.2 (L) 04/17/2022 0321   GFRNONAA 50 (L) 04/17/2022 0321   GFRAA 62 06/16/2016 1538    INR    Component Value Date/Time   INR 1.4 (H) 04/12/2022 0510     Intake/Output Summary (Last 24 hours) at 04/17/2022 1055 Last data filed at 04/17/2022 0600 Gross per 24 hour  Intake 1206 ml  Output 450 ml  Net 756 ml     Assessment/Plan:  82 y.o. male is s/p thrombolysis with thrombectomy and subsequent stenting for left lower extremity ischemia.  Patient remains with strong palpable pedal pulses.  4 Days Post-Op   Plan: Vascular surgery it is okay for patient to discharge home with hospice at this time.  She will need to continue on his anticoagulation as current in the hospital here.  Patient's left lower extremity pedal edema and ankle edema is most likely related to his cardiac status.  Patient remains with strong palpable pulses.  Patient will be scheduled to return to clinic in 3 months with bilateral lower extremity  ABIs if patient wishes to do so.  Now that the patient is on hospice primary care and family can decide on patient's care going forward.  DVT prophylaxis: Eliquis 5 mg twice daily, Plavix 75 mg daily.   Drema Pry [facility] 04/17/2022 10:55 AM

## 2022-04-17 NOTE — TOC Transition Note (Signed)
Transition of Care [facility]) - CM/SW Discharge Note   Patient Details  Name: Curtis Wagner MRN: [id number] Date of Birth: 05-20-1940  Transition of Care [facility]) CM/SW Contact:  Tiburcio Bash, LCSW Phone Number: 04/17/2022, 3:29 PM   Clinical Narrative:     Patient to discharge today home with [facility] and has PCP apt scheduled with [facility] for this coming Thursday at 11:15 am with Dr. Lamonte Sakai located at [place], [place] [address]. Daughter to pick up, no further needs.   Final next level of care: Home w Home Health Services Barriers to Discharge: No Barriers Identified   Patient Goals and CMS Choice CMS Medicare.gov Compare Post Acute Care list provided to:: Patient Choice offered to / list presented to : Patient  Discharge Placement                         Discharge Plan and Services Additional resources added to the After Visit Summary for                            [facility] Arranged: PT, RN [facility] Agency: [place] Date [facility] Agency Contacted: 04/15/22 Time [place]: 82 Representative spoke with at [place]: Alvis Lemmings  Social Determinants of Health ([place]) Interventions SDOH Screenings   Food Insecurity: No Food Insecurity (04/12/2022)  Housing: Low Risk  (04/12/2022)  Transportation Needs: No Transportation Needs (04/12/2022)  Utilities: Not At Risk (04/12/2022)  Tobacco Use: Medium Risk (04/14/2022)     Readmission Risk Interventions     No data to display

## 2022-04-17 NOTE — Discharge Summary (Signed)
Physician Discharge Summary   Patient: Curtis Wagner MRN: [id number] DOB: 02/10/1940  Admit date:     04/11/2022  Discharge date: 04/17/22  Discharge Physician: Sharen Hones   PCP: Patient, No Pcp Per   Recommendations at discharge:   Follow-up with cardiology in 2 weeks. Follow-up with vascular surgery in 2 weeks TOC will set up follow-up with PCP in 1 week. Follow-up with hospice.  Discharge Diagnoses: Principal Problem:   Fall Active Problems:   Orthostatic hypotension   Absent pedal pulses   PAF (paroxysmal atrial fibrillation) (HCC)   Hypertension   Bilateral pulmonary embolism (HCC)   Type 2 diabetes mellitus with stage 3b chronic kidney disease, without long-term current use of insulin (HCC)   Myalgia   PAD (peripheral artery disease) (HCC)   Acute lower limb ischemia   Left leg pain   Orthostatic dizziness   Coronary artery disease of native artery of native heart with stable angina pectoris ([place])   Dilated cardiomyopathy (HCC)   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 with rest pain (HCC)   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 Resolved Problems:   * No resolved hospital problems. *  Hospital Course: Curtis Wagner is a 82 year old male with history of arthritis, DVT, kidney stones, hyperlipidemia, hypertension, and PAF who was admitted to the hospital on 3/19 with complaints of left lower extremity pain, was found to have a left lower extremity ischemia.  CT angiogram showed occlusion of common femoral bifurcation of left SFA and the  profunda.he superficial femoral artery and popliteal artery are occluded throughout their entire course. Occlusion at bifurcation consistent with likely embolic phenomenon.  Patient was seen by vascular surgery, thrombectomy was performed on 3/20, additional thrombectomy and drug-eluting stent was performed on 3/21. Patient also had left atrial thrombus on CT scan echocardiogram, seen by cardiology, anticoagulation was switched to Eliquis from Xarelto.  Echocardiogram also showed ejection fraction less than 20%, moderate pulmonary hypertension.   Assessment and Plan:  Left lower extremity arterial disease. Left leg ischemia. Patient is status post vascular intervention.  Condition has improved, continue anticoagulation and antiplatelet therapy. Vascular surgery has cleared patient for discharge, he will follow-up with Dr. Lucky Cowboy in 2 weeks.   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 Persistent atrial fibrillation. Nonsustained ventricular tachycardia. Coronary artery disease with elevated troponin. Left atrial thrombus. Patient has been seen by cardiology, due to new thrombosis, anticoagulation was switched from Xarelto to Eliquis. Currently he is euvolemic, no exacerbation of congestive heart failure. Patient will be continued on amiodarone and beta-blocker.  Follow-up with cardiology in 2 weeks. Due to end-stage congestive heart failure, patient was also referred to hospice.   Generalized weakness. Frequent falls. Start PT/OT.   Moderate protein calorie malnutrition. Patient appears to be malnourished, start protein supplement.   Chronic kidney disease stage IIIa. Acute kidney injury ruled out. Hypokalemia Hyponatremia. Mild metabolic acidosis. Conditions stable.  7 days sodium bicarb prescribed.   Thrombocytopenia. Platelets has normalized.      Consultants: Cardiology, vascular  surgery. Procedures performed: Lower extremity angioplasty and  stent. Disposition:  Hospice/home Diet recommendation:  Discharge Diet Orders (From admission, onward)     Start     Ordered   04/17/22 0000  Diet - low sodium heart healthy        04/17/22 1338           Cardiac diet DISCHARGE MEDICATION: Allergies as of 04/17/2022       Reactions   Morphine Other (See Comments)   Morphine And Related Other (See Comments)   HALLUCINATIONS        Medication List     STOP taking these medications    rivaroxaban 20 MG Tabs tablet Commonly known as: XARELTO       TAKE these medications    allopurinol 300 MG tablet Commonly known as: ZYLOPRIM Take 300 mg by mouth daily as needed (gout). Last dispense not on file   amiodarone 200 MG tablet Commonly known as: PACERONE Take 1 tablet (200 mg total) by mouth 2 (two) times daily.   apixaban 5 MG Tabs tablet Commonly known as: ELIQUIS Take 1 tablet (5 mg total) by mouth 2 (two) times daily.   atorvastatin 80 MG tablet Commonly known as: LIPITOR Take 80 mg by mouth daily. Last dispense 03/2022 30DS   Centrum Silver Adult 50+ Tabs Take 1 tablet by mouth daily.   clopidogrel 75 MG tablet Commonly known as: PLAVIX Take 1 tablet (75 mg total) by mouth daily. Start taking on: April 18, 2022   Jardiance 10 MG Tabs tablet Generic drug: empagliflozin Take 10 mg by mouth every morning. Last dispense 02/2022 30DS   metoprolol succinate 25 MG 24 hr tablet Commonly known as: TOPROL-XL Take 1 tablet (25 mg total) by mouth daily. Take with or immediately following a meal. Start taking on: April 18, 2022 What changed:  medication strength how much to take when to take this additional instructions   nitroGLYCERIN 0.4 MG SL tablet Commonly known as: NITROSTAT Place 0.4 mg under the tongue every 5 (five) minutes as needed for chest pain.   potassium chloride SA 20 MEQ tablet Commonly known as: KLOR-CON M Take 40 mEq by  mouth daily. Last dispense 12/2021 30DS   tamsulosin 0.4 MG Caps capsule Commonly known as: FLOMAX Take 0.4 mg by mouth at bedtime. Last dispense 03/2022 30DS        Follow-up Information     Minna Merritts, MD Follow up in 2 week(s).   Specialty: Cardiology Contact information: [place] [address] [phone]         Algernon Huxley, MD Follow up in 2 week(s).   Specialties: Vascular Surgery, Radiology, Interventional Cardiology Contact information: [address] [place] [address] [phone]                Discharge Exam: Filed Weights   04/15/22 0400 04/16/22 0234 04/17/22 0206  Weight: 83.7 kg 83 kg 82.8 kg   General exam: Appears calm and comfortable  Respiratory system: Clear to auscultation. Respiratory effort normal. Cardiovascular system: S1 & S2 heard, RRR. No JVD, murmurs, rubs, gallops or clicks. No pedal edema. Gastrointestinal system: Abdomen is nondistended, soft and nontender. No organomegaly or masses felt. Normal bowel sounds heard. Central nervous system: Alert and oriented. No focal neurological deficits. Extremities: Symmetric 5 x 5 power. Skin: No rashes, lesions or ulcers Psychiatry: Judgement and insight appear normal. Mood & affect appropriate.    Condition at discharge: fair  The results of significant diagnostics from this hospitalization (  including imaging, microbiology, ancillary and laboratory) are listed below for reference.   Imaging Studies: MR CARDIAC MORPHOLOGY W WO CONTRAST  Result Date: 04/14/2022 CLINICAL DATA:  LA mass, cardiomyopathy EXAM: CARDIAC MRI TECHNIQUE: The patient was scanned on a 1.5 Tesla Siemens magnet. A dedicated cardiac coil was used. Functional imaging was done using Fiesta sequences. 2,3, and 4 chamber views were done to assess for RWMA's. Modified Simpson's rule using a short axis stack was used to calculate an ejection fraction on a dedicated work Brewing technologist. The patient received 11 cc of Gadavist. After 10 minutes inversion recovery sequences were used to assess for infiltration and scar tissue. Velocity flow mapping performed in the ascending aorta and main pulmonary artery. CONTRAST:  11 cc  of Gadavist FINDINGS: 1. Moderately dilated LV size, severely reduced systolic function (LVEF = 18%). Normal LV wall thickness. There is global hypokinesis with no regional wall motion abnormalities. There is no late gadolinium enhancement in the left ventricular myocardium. Mildly elevated LV native T1 value of 1046 ms (normal <1000) LV ECV 32% (normal <30%) LVEDV: 291 ml LVESV: 240 ml SV: 51 ml CO: 4 L/min Myocardial mass: 150 g 2. Normal right ventricular size and thickness. Mildly reduced RV systolic function (RVEF = 39%). 3. Severely dilated left atrium, mildly dilated right atrium. There is a 31 x 22 mm mass in the superior and posterior aspect of the left atrium most consistent with a thrombus. 4. Normal size of the aortic root, ascending aorta and pulmonary artery. 5.  Mild MR, mild AI. 6.  Normal pericardium.  No pericardial effusion. IMPRESSION: 1. Moderately dilated LV, severely reduced LV systolic function. LVEF 18%. 2. There is a 31 x 22 mm mass in the superior and posterior aspect of the left atrium most consistent with a thrombus. 3. There is no late gadolinium enhancement in the left ventricular myocardium. 4. There is no evidence for myocardial infiltration. 5. Mildly reduced RV function. 6. Findings consistent with dilated nonischemic cardiomyopathy. Electronically Signed   By: Kate Sable M.D.   On: 04/14/2022 17:53   MR CARDIAC VELOCITY FLOW MAP  Result Date: 04/14/2022 CLINICAL DATA:  LA mass, cardiomyopathy EXAM: CARDIAC MRI TECHNIQUE: The patient was scanned on a 1.5 Tesla Siemens magnet. A dedicated cardiac coil was used. Functional imaging was done using Fiesta sequences. 2,3, and 4 chamber views were done to assess for RWMA's.  Modified Simpson's rule using a short axis stack was used to calculate an ejection fraction on a dedicated work Conservation officer, nature. The patient received 11 cc of Gadavist. After 10 minutes inversion recovery sequences were used to assess for infiltration and scar tissue. Velocity flow mapping performed in the ascending aorta and main pulmonary artery. CONTRAST:  11 cc  of Gadavist FINDINGS: 1. Moderately dilated LV size, severely reduced systolic function (LVEF = 18%). Normal LV wall thickness. There is global hypokinesis with no regional wall motion abnormalities. There is no late gadolinium enhancement in the left ventricular myocardium. Mildly elevated LV native T1 value of 1046 ms (normal <1000) LV ECV 32% (normal <30%) LVEDV: 291 ml LVESV: 240 ml SV: 51 ml CO: 4 L/min Myocardial mass: 150 g 2. Normal right ventricular size and thickness. Mildly reduced RV systolic function (RVEF = 39%). 3. Severely dilated left atrium, mildly dilated right atrium. There is a 31 x 22 mm mass in the superior and posterior aspect of the left atrium most consistent with a thrombus. 4. Normal size of  the aortic root, ascending aorta and pulmonary artery. 5.  Mild MR, mild AI. 6.  Normal pericardium.  No pericardial effusion. IMPRESSION: 1. Moderately dilated LV, severely reduced LV systolic function. LVEF 18%. 2. There is a 31 x 22 mm mass in the superior and posterior aspect of the left atrium most consistent with a thrombus. 3. There is no late gadolinium enhancement in the left ventricular myocardium. 4. There is no evidence for myocardial infiltration. 5. Mildly reduced RV function. 6. Findings consistent with dilated nonischemic cardiomyopathy. Electronically Signed   By: Kate Sable M.D.   On: 04/14/2022 17:53   PERIPHERAL VASCULAR CATHETERIZATION  Result Date: 04/13/2022 See surgical note for result.  PERIPHERAL VASCULAR CATHETERIZATION  Result Date: 04/12/2022 See surgical note for  result.  ECHOCARDIOGRAM COMPLETE  Result Date: 04/12/2022    ECHOCARDIOGRAM REPORT   Patient Name:   FRIDDIE CLOWES Date of Exam: 04/12/2022 Medical Rec #:  [id number]      Height:       71.0 in Accession #:    [id number]     Weight:       174.2 lb Date of Birth:  Feb 14, 1940      BSA:          1.988 m Patient Age:    34 years       BP:           130/88 mmHg Patient Gender: M              HR:           79 bpm. Exam Location:  [facility] Procedure: 2D Echo, Cardiac Doppler, Color Doppler and Intracardiac            Opacification Agent Indications:     Thrombus in heart chamber  History:         Patient has no prior history of Echocardiogram examinations.                  Arrythmias:Atrial Fibrillation; Risk Factors:Hypertension,                  Diabetes and Dyslipidemia. Pulmonary embolus.  Sonographer:     Wenda Low Referring Phys:  [place] Diagnosing Phys: Kate Sable MD IMPRESSIONS  1. Left ventricular ejection fraction, by estimation, is <20%. The left ventricle has severely decreased function. The left ventricle demonstrates global hypokinesis. The left ventricular internal cavity size was mildly dilated. Left ventricular diastolic parameters are indeterminate.  2. Right ventricular systolic function is low normal. The right ventricular size is normal. There is moderately elevated pulmonary artery systolic pressure. The estimated right ventricular systolic pressure is 0000000 mmHg.  3. There is a 3 x 2.5 cm mass attached to the superior-posterior wall of the left atrium. Differential includes tumor (myxoma) vs thrombus- less likely. consider CMR for better characterization.. Left atrial size was mildly dilated.  4. Right atrial size was moderately dilated.  5. The mitral valve is normal in structure. Mild mitral valve regurgitation.  6. The aortic valve is tricuspid. Aortic valve regurgitation is mild. Aortic valve sclerosis/calcification is present, without any evidence of aortic stenosis.  7.  The inferior vena cava is dilated in size with <50% respiratory variability, suggesting right atrial pressure of 15 mmHg. FINDINGS  Left Ventricle: Left ventricular ejection fraction, by estimation, is <20%. The left ventricle has severely decreased function. The left ventricle demonstrates global hypokinesis. Definity contrast agent was given IV to delineate the left ventricular endocardial borders.  The left ventricular internal cavity size was mildly dilated. There is no left ventricular hypertrophy. Left ventricular diastolic parameters are indeterminate. Right Ventricle: The right ventricular size is normal. No increase in right ventricular wall thickness. Right ventricular systolic function is low normal. There is moderately elevated pulmonary artery systolic pressure. The tricuspid regurgitant velocity  is 2.81 m/s, and with an assumed right atrial pressure of 15 mmHg, the estimated right ventricular systolic pressure is 0000000 mmHg. Left Atrium: There is a 3 x 2.5 cm mass attached to the superior-posterior wall of the left atrium. Differential includes tumor (myxoma) vs thrombus- less likely. consider CMR for better characterization. Left atrial size was mildly dilated. Right Atrium: Right atrial size was moderately dilated. Pericardium: There is no evidence of pericardial effusion. Mitral Valve: The mitral valve is normal in structure. Mild mitral valve regurgitation. MV peak gradient, 6.8 mmHg. The mean mitral valve gradient is 3.0 mmHg. Tricuspid Valve: The tricuspid valve is normal in structure. Tricuspid valve regurgitation is mild. Aortic Valve: The aortic valve is tricuspid. Aortic valve regurgitation is mild. Aortic regurgitation PHT measures 552 msec. Aortic valve sclerosis/calcification is present, without any evidence of aortic stenosis. Aortic valve mean gradient measures 2.3  mmHg. Aortic valve peak gradient measures 4.1 mmHg. Aortic valve area, by VTI measures 2.49 cm. Pulmonic Valve: The  pulmonic valve was normal in structure. Pulmonic valve regurgitation is mild. Aorta: The aortic root is normal in size and structure. Venous: The inferior vena cava is dilated in size with less than 50% respiratory variability, suggesting right atrial pressure of 15 mmHg. IAS/Shunts: No atrial level shunt detected by color flow Doppler.  LEFT VENTRICLE PLAX 2D LVIDd:         5.90 cm LVIDs:         5.50 cm LV PW:         1.00 cm LV IVS:        0.90 cm LVOT diam:     1.90 cm LV SV:         48 LV SV Index:   24 LVOT Area:     2.84 cm  LV Volumes (MOD) LV vol d, MOD A2C: 196.0 ml LV vol d, MOD A4C: 148.0 ml LV vol s, MOD A2C: 144.0 ml LV vol s, MOD A4C: 120.0 ml LV SV MOD A2C:     52.0 ml LV SV MOD A4C:     148.0 ml LV SV MOD BP:      43.0 ml RIGHT VENTRICLE RV Basal diam:  3.80 cm RV Mid diam:    2.40 cm RV S prime:     16.50 cm/s TAPSE (M-mode): 1.7 cm LEFT ATRIUM              Index        RIGHT ATRIUM           Index LA diam:        5.00 cm  2.51 cm/m   RA Area:     26.70 cm LA Vol (A2C):   179.0 ml 90.02 ml/m  RA Volume:   88.20 ml  44.36 ml/m LA Vol (A4C):   66.3 ml  33.34 ml/m LA Biplane Vol: 119.0 ml 59.85 ml/m  AORTIC VALVE                    PULMONIC VALVE AV Area (Vmax):    2.36 cm     PV Vmax:       0.70 m/s AV Area (  Vmean):   2.27 cm     PV Peak grad:  2.0 mmHg AV Area (VTI):     2.49 cm AV Vmax:           101.73 cm/s AV Vmean:          68.867 cm/s AV VTI:            0.191 m AV Peak Grad:      4.1 mmHg AV Mean Grad:      2.3 mmHg LVOT Vmax:         84.75 cm/s LVOT Vmean:        55.100 cm/s LVOT VTI:          0.168 m LVOT/AV VTI ratio: 0.88 AI PHT:            552 msec  AORTA Ao Root diam: 3.50 cm Ao Asc diam:  3.70 cm MITRAL VALVE               TRICUSPID VALVE MV Area (PHT): 4.46 cm    TR Peak grad:   31.6 mmHg MV Area VTI:   1.76 cm    TR Vmax:        281.00 cm/s MV Peak grad:  6.8 mmHg MV Mean grad:  3.0 mmHg    SHUNTS MV Vmax:       1.30 m/s    Systemic VTI:  0.17 m MV Vmean:      73.8 cm/s    Systemic Diam: 1.90 cm MV Decel Time: 170 msec MV E velocity: 82.70 cm/s Kate Sable MD Electronically signed by Kate Sable MD Signature Date/Time: 04/12/2022/12:10:29 PM    Final    CT ANGIO AO+BIFEM W & OR WO CONTRAST  Result Date: 04/12/2022 CLINICAL DATA:  absent pedal pulse / leg pain/ cold ext. Left leg numbness. EXAM: CT ANGIOGRAPHY OF ABDOMINAL AORTA WITH ILIOFEMORAL RUNOFF TECHNIQUE: Multidetector CT imaging of the abdomen, pelvis and lower extremities was performed using the standard protocol during bolus administration of intravenous contrast. Multiplanar CT image reconstructions and MIPs were obtained to evaluate the vascular anatomy. RADIATION DOSE REDUCTION: This exam was performed according to the departmental dose-optimization program which includes automated exposure control, adjustment of the mA and/or kV according to patient size and/or use of iterative reconstruction technique. CONTRAST:  16mL OMNIPAQUE IOHEXOL 350 MG/ML SOLN COMPARISON:  None Available. FINDINGS: VASCULAR Heart: There are irregular filling defects noted along the posterior wall of the left atrium concerning for clot. Cardiomegaly. Aorta: Aortic atherosclerosis.  No aneurysm or dissection. Celiac: Patent without evidence of aneurysm, dissection, vasculitis or significant stenosis. SMA: Patent without evidence of aneurysm, dissection, vasculitis or significant stenosis. Renals: Both renal arteries are patent without evidence of aneurysm, dissection, vasculitis, fibromuscular dysplasia or significant stenosis. IMA: Patent without evidence of aneurysm, dissection, vasculitis or significant stenosis. RIGHT Lower Extremity Inflow: Common, internal and external iliac arteries are patent without evidence of aneurysm, dissection, vasculitis or significant stenosis. Moderate atherosclerotic calcifications. Outflow: Common, superficial and profunda femoral arteries and the popliteal artery are patent without evidence of  aneurysm, dissection, vasculitis or significant stenosis. Scattered calcifications. Runoff: Disease three-vessel runoff in the right calf with dominant vessel the posterior tibial artery. All 3 vessels appear patent to the ankle. LEFT Lower Extremity Inflow: Common, internal and external iliac arteries are patent without evidence of aneurysm, dissection, vasculitis or significant stenosis. Scattered calcifications. Outflow: Left common femoral artery is patent. There is occlusion just beyond the common femoral bifurcation of both the left superficial  femoral artery and the profunda femoral artery. Superficial femoral artery and popliteal artery remain occluded along their entire course. Runoff: Reconstitution of the trifurcation vessels in the left calf. All 3 vessels demonstrate disease. The anterior tibial artery appears to occlude in the distal calf. Peroneal and dominant posterior tibial arteries are patent to the ankle. Veins: No obvious venous abnormality within the limitations of this arterial phase study. Review of the MIP images confirms the above findings. NON-VASCULAR Lower chest: Small bilateral pleural effusions. Dependent bibasilar atelectasis. Hepatobiliary: Small layering gallstones within the gallbladder. No focal hepatic abnormality. Pancreas: No focal abnormality or ductal dilatation. Spleen: No focal abnormality.  Normal size. Adrenals/Urinary Tract: Diffuse enlargement of the left adrenal gland. Numerous bilateral renal cysts, the largest 4.3 cm in the right midpole. Moderate left hydronephrosis. Left ureter is dilated to the bladder. No obstructing stones visualized. No hydronephrosis on the right. Multiple nonobstructing left renal stones measuring up to 14 mm in the midpole. Urinary bladder grossly unremarkable. Stomach/Bowel: Normal appendix. Sigmoid diverticulosis. No active diverticulitis. Stomach and small bowel decompressed, unremarkable. Lymphatic: No adenopathy Reproductive: Prostate  enlargement. Other: No free fluid or free air. Musculoskeletal: No acute bony abnormality. IMPRESSION: VASCULAR Filling defect along the posterior wall of the left atrium concerning for clot. This could be further evaluated with echo. Cardiomegaly. Atherosclerotic irregularity throughout the aorta and iliac vessels. 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 Reconstitution of the trifurcation vessels below the left knee with dominant runoff via a diseased posterior tibial artery. Mild to moderate disease throughout the right leg inflow and outflow vessels. Three-vessel runoff to the ankle with dominant flow via the posterior tibial artery. NON-VASCULAR Small bilateral pleural effusions. Cholelithiasis. Left hydronephrosis. Left ureter is dilated to the bladder. No visible obstructing stones. Prostate enlargement. Electronically Signed   By: Rolm Baptise M.D.   On: 04/12/2022 02:34   MR BRAIN WO CONTRAST  Result Date: 04/12/2022 CLINICAL DATA:  Initial evaluation for syncope/presyncope. EXAM: MRI HEAD WITHOUT CONTRAST TECHNIQUE: Multiplanar, multiecho pulse sequences of the brain and surrounding structures were obtained without intravenous contrast. COMPARISON:  None Available. FINDINGS: Brain: Diffuse prominence of the CSF containing spaces compatible generalized age-related cerebral atrophy. Patchy and confluent T2/FLAIR hyperintensity involving the periventricular deep white matter both cerebral hemispheres as well as the pons, consistent with chronic small vessel ischemic disease, moderately advanced. No evidence for acute or subacute ischemia. Gray-white matter differentiation maintained. No areas of chronic cortical infarction. No acute or chronic intracranial blood products. No mass lesion, midline shift or mass effect. No hydrocephalus or extra-axial fluid collection. Pituitary gland and suprasellar region within  normal limits. Vascular: Major intracranial vascular flow voids are maintained. Skull and upper cervical spine: Craniocervical junction within normal limits. Bone marrow signal intensity normal. No scalp soft tissue abnormality. Sinuses/Orbits: Prior ocular lens replacement on the right. Globes and orbital soft tissues demonstrate no acute finding. Paranasal sinuses are clear. Trace bilateral mastoid effusions noted, of doubtful significance. Other: None. IMPRESSION: 1. No acute intracranial abnormality. 2. Age-related cerebral atrophy with moderately advanced chronic microvascular ischemic disease. Electronically Signed   By: Jeannine Boga M.D.   On: 04/12/2022 00:39   DG Ankle 2 Views Left  Result Date: 04/11/2022 CLINICAL DATA:  Multiple recent falls. EXAM: LEFT ANKLE - 2 VIEW COMPARISON:  None Available. FINDINGS: There is no evidence of an acute fracture or dislocation. A chronic deformity is seen involving the medial aspect of  the left navicular bone, with chronic changes also noted along the left lateral malleolus and left medial malleolus. Mild degenerative changes are seen involving the left ankle, with moderate severity degenerative changes noted along the dorsal aspect of the proximal to mid left foot. Soft tissues are unremarkable. IMPRESSION: 1. No acute osseous abnormality. 2. Chronic and degenerative changes, as described above. Electronically Signed   By: Virgina Norfolk M.D.   On: 04/11/2022 20:15   DG Foot 2 Views Left  Result Date: 04/11/2022 CLINICAL DATA:  Pain after falls EXAM: LEFT FOOT - 2 VIEW COMPARISON:  None Available. FINDINGS: Calcaneal spurs. Mild dorsal spurring in the midfoot. No fracture or dislocation. Normal alignment. Normal mineralization. No radiodense foreign body. No subcutaneous gas. IMPRESSION: Degenerative changes as above. No acute findings. Electronically Signed   By: Lucrezia Europe M.D.   On: 04/11/2022 20:07   DG Hip Unilat W or Wo Pelvis 2-3 Views  Left  Result Date: 04/11/2022 CLINICAL DATA:  Fall, pain. EXAM: DG HIP (WITH OR WITHOUT PELVIS) 2-3V LEFT COMPARISON:  None Available. FINDINGS: There is no evidence of hip fracture or dislocation. There is no evidence of arthropathy or other focal bone abnormality. Vascular calcifications. IMPRESSION: No acute bony abnormality. Electronically Signed   By: Rolm Baptise M.D.   On: 04/11/2022 20:06    Microbiology: Results for orders placed or performed during the hospital encounter of 04/11/22  Surgical PCR screen     Status: None   Collection Time: 04/12/22 11:12 AM   Specimen: Nasal Mucosa; Nasal Swab  Result Value Ref Range Status   MRSA, PCR NEGATIVE NEGATIVE Final   Staphylococcus aureus NEGATIVE NEGATIVE Final    Comment: (NOTE) The Xpert SA Assay (FDA approved for NASAL specimens in patients 67 years of age and older), is one component of a comprehensive surveillance program. It is not intended to diagnose infection nor to guide or monitor treatment. Performed at [facility], [address] [place], [place] [address]     Labs: CBC: Recent Labs  Lab 04/13/22 0010 04/13/22 0452 04/14/22 0302 04/15/22 0402 04/16/22 0419  WBC 5.9 5.5 5.2 5.1 5.7  HGB 14.5 13.6 12.6* 12.4* 14.7  HCT 43.7 41.6 38.3* 37.0* 44.7  MCV 99.1 99.3 99.2 97.9 99.6  PLT 143* 120* 121* 115* [id number]   Basic Metabolic Panel: Recent Labs  Lab 04/13/22 0452 04/14/22 0302 04/15/22 0402 04/16/22 0419 04/17/22 0321  NA 132* 138 133* 135 132*  K 3.4* 4.2 3.3* 4.2 3.7  CL 106 105 106 101 102  CO2 21* 21* 21* 24 21*  GLUCOSE 95 122* 128* 105* 107*  BUN 17 20 17 16 23   CREATININE 1.30* 1.40* 1.31* 1.44* 1.39*  CALCIUM 7.6* 8.3* 7.8* 8.4* 8.2*  MG 1.9 1.8 2.1 1.8 2.3  PHOS 3.5 2.6 2.6 3.4 3.6   Liver Function Tests: Recent Labs  Lab 04/12/22 0139 04/13/22 0452 04/14/22 0302 04/15/22 0402 04/16/22 0419  AST 32 31 29 30  32  ALT 17 17 14 12 14   ALKPHOS 105 111 106 104 109  BILITOT  1.9* 2.0* 1.1 1.1 1.4*  PROT 5.9* 5.5* 5.4* 5.3* 6.4*  ALBUMIN 2.9* 2.8* 2.6* 2.6* 3.0*   CBG: Recent Labs  Lab 04/12/22 1807  GLUCAP 87    Discharge time spent: greater than 30 minutes.  Signed: Sharen Hones, MD [facility] 04/17/2022

## 2022-04-20 ENCOUNTER — Encounter: Payer: Self-pay | Admitting: Internal Medicine

## 2022-04-20 ENCOUNTER — Ambulatory Visit: Payer: Medicare PPO | Admitting: Internal Medicine

## 2022-04-20 VITALS — BP 112/78 | HR 78 | Ht 69.0 in | Wt 178.8 lb

## 2022-04-20 DIAGNOSIS — R7302 Impaired glucose tolerance (oral): Secondary | ICD-10-CM

## 2022-04-20 DIAGNOSIS — H9113 Presbycusis, bilateral: Secondary | ICD-10-CM

## 2022-04-20 DIAGNOSIS — E782 Mixed hyperlipidemia: Secondary | ICD-10-CM

## 2022-04-20 DIAGNOSIS — M1A9XX0 Chronic gout, unspecified, without tophus (tophi): Secondary | ICD-10-CM | POA: Diagnosis not present

## 2022-04-20 DIAGNOSIS — E876 Hypokalemia: Secondary | ICD-10-CM

## 2022-04-20 DIAGNOSIS — I480 Paroxysmal atrial fibrillation: Secondary | ICD-10-CM

## 2022-04-20 DIAGNOSIS — M791 Myalgia, unspecified site: Secondary | ICD-10-CM

## 2022-04-20 MED ORDER — SODIUM BICARBONATE 650 MG PO TABS: 650.0000 mg | ORAL_TABLET | Freq: Two times a day (BID) | ORAL | 0 refills | Status: AC

## 2022-04-20 MED ORDER — ALLOPURINOL 300 MG PO TABS: 300.0000 mg | ORAL_TABLET | Freq: Every day | ORAL | 3 refills | Status: DC

## 2022-04-20 MED ORDER — JARDIANCE 10 MG PO TABS: 10.0000 mg | ORAL_TABLET | Freq: Every morning | ORAL | 3 refills | Status: DC

## 2022-04-20 MED ORDER — POTASSIUM CHLORIDE CRYS ER 20 MEQ PO TBCR: 40.0000 meq | EXTENDED_RELEASE_TABLET | Freq: Every day | ORAL | 3 refills | Status: DC

## 2022-04-20 MED ORDER — AMIODARONE HCL 200 MG PO TABS: 200.0000 mg | ORAL_TABLET | Freq: Two times a day (BID) | ORAL | 3 refills | Status: DC

## 2022-04-20 NOTE — Progress Notes (Signed)
New Patient Office Visit  Subjective    Patient ID: Curtis Wagner, male    DOB: September 22, 1940  Age: 82 y.o. MRN: [id number]  CC:  Chief Complaint  Patient presents with   Establish Care    New Patient    HPI Curtis Wagner presents to establish care Previous Primary Care provider/office:   Patient comes into set up primary care at our office.  He is accompanied by his daughter.  Patient had recently moved to this area and shortly after he was taken to the emergency room with history of falls and Left  lower leg pains.  It turns out that he had extensive peripheral arterial disease and was suffering from left  lower leg ischemia.  Vascular surgery had to intervene with thrombectomy and stent placement. His echocardiogram showed a left atrial thrombus and a very low EF of less than 20%.  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 Patient has several other medical problems as listed in the problem list. Today however patient is in good spirits he says he is feeling well.  However he is still concerned about being unsteady on his feet and is having some balance issues.  He does walk around with a cane.  He is being set up with a physical therapy at home which will help him with his balance. He is also having some hearing difficulties and will refer to ENT for hearing test and hearing aids.   He does not complain of chest pain but gets shortness of breath with mild exertion. His PHQ-9/GAD score is 10/12. Apparently he ran out of his Lexapro 20 mg/day, will send in refills.    Outpatient Encounter Medications as of 04/20/2022  Medication Sig   apixaban (ELIQUIS) 5 MG TABS tablet Take 1 tablet (5 mg total) by mouth 2 (two) times daily.   atorvastatin (LIPITOR) 80 MG tablet Take 80 mg by mouth daily. Last dispense 03/2022 30DS   clopidogrel (PLAVIX) 75 MG tablet Take 1 tablet (75 mg total) by mouth daily.   metoprolol succinate (TOPROL-XL) 25 MG 24 hr tablet Take 1 tablet  (25 mg total) by mouth daily. Take with or immediately following a meal.   Multiple Vitamins-Minerals (CENTRUM SILVER ADULT 50+) TABS Take 1 tablet by mouth daily.   nitroGLYCERIN (NITROSTAT) 0.4 MG SL tablet Place 0.4 mg under the tongue every 5 (five) minutes as needed for chest pain.   tamsulosin (FLOMAX) 0.4 MG CAPS capsule Take 0.4 mg by mouth at bedtime. Last dispense 03/2022 30DS   [DISCONTINUED] allopurinol (ZYLOPRIM) 300 MG tablet Take 300 mg by mouth daily as needed (gout). Last dispense not on file   [DISCONTINUED] amiodarone (PACERONE) 200 MG tablet Take 1 tablet (200 mg total) by mouth 2 (two) times daily.   [DISCONTINUED] JARDIANCE 10 MG TABS tablet Take 10 mg by mouth every morning. Last dispense 02/2022 30DS   [DISCONTINUED] potassium chloride SA (KLOR-CON M) 20 MEQ tablet Take 40 mEq by mouth daily. Last dispense 12/2021 30DS   [DISCONTINUED] sodium bicarbonate 650 MG tablet Take 1 tablet (650 mg total) by mouth 2 (two) times daily for 7 days.   allopurinol (ZYLOPRIM) 300 MG tablet Take 1 tablet (300 mg total) by mouth daily. Last dispense not on file   amiodarone (PACERONE) 200 MG tablet Take 1 tablet (200 mg total) by mouth 2 (two) times daily.   JARDIANCE 10 MG TABS tablet Take 1 tablet (10 mg total) by mouth every morning.  Last dispense 02/2022 30DS   potassium chloride SA (KLOR-CON M) 20 MEQ tablet Take 2 tablets (40 mEq total) by mouth daily. Last dispense 12/2021 30DS   sodium bicarbonate 650 MG tablet Take 1 tablet (650 mg total) by mouth 2 (two) times daily for 7 days.   No facility-administered encounter medications on file as of 04/20/2022.    Past Medical History:  Diagnosis Date   Arthritis    was a Pharmacist, hospital, has arithritis in numerous areas of body   DVT (deep venous thrombosis) ([place])    History of kidney stones    Hyperlipidemia    Hypertension    PAF (paroxysmal atrial fibrillation) ([place])     Past Surgical History:  Procedure Laterality Date   EYE  SURGERY Right    /w IOL   LOWER EXTREMITY ANGIOGRAPHY Left 04/12/2022   Procedure: Lower Extremity Angiography;  Surgeon: Algernon Huxley, MD;  Location: [facility];  Service: Cardiovascular;  Laterality: Left;   LOWER EXTREMITY INTERVENTION Left 04/13/2022   Procedure: LOWER EXTREMITY INTERVENTION;  Surgeon: Algernon Huxley, MD;  Location: [facility];  Service: Cardiovascular;  Laterality: Left;   SHOULDER SURGERY Left 1961   TONSILLECTOMY     TOTAL SHOULDER ARTHROPLASTY Right 01/01/2015   Procedure: RIGHT TOTAL SHOULDER ARTHROPLASTY;  Surgeon: Ninetta Lights, MD;  Location: [facility];  Service: Orthopedics;  Laterality: Right;    Family History  Problem Relation Age of Onset   Rheumatic fever Mother    Stroke Mother    Heart disease Mother    Stroke Father     Social History   Socioeconomic History   Marital status: Divorced    Spouse name: Not on file   Number of children: Not on file   Years of education: Not on file   Highest education level: Not on file  Occupational History   Not on file  Tobacco Use   Smoking status: Former   Smokeless tobacco: Former    Types: Nurse, children's Use: Never used  Substance and Sexual Activity   Alcohol use: No    Alcohol/week: 0.0 standard drinks of alcohol   Drug use: No   Sexual activity: Not on file  Other Topics Concern   Not on file  Social History Narrative   Not on file   Social Determinants of Health   Financial Resource Strain: Not on file  Food Insecurity: No Food Insecurity (04/12/2022)   Hunger Vital Sign    Worried About Running Out of Food in the Last Year: Never true    Ran Out of Food in the Last Year: Never true  Transportation Needs: No Transportation Needs (04/12/2022)   PRAPARE - Hydrologist (Medical): No    Lack of Transportation (Non-Medical): No  Physical Activity: Not on file  Stress: Not on file  Social Connections: Not on file  Intimate Partner  Violence: Not At Risk (04/12/2022)   Humiliation, Afraid, Rape, and Kick questionnaire    Fear of Current or Ex-Partner: No    Emotionally Abused: No    Physically Abused: No    Sexually Abused: No    Review of Systems  Constitutional:  Negative for chills, fever, malaise/fatigue and weight loss.  HENT:  Positive for hearing loss. Negative for congestion, ear discharge, ear pain, sinus pain, sore throat and tinnitus.   Eyes:  Negative for double vision, photophobia, pain and discharge.  Respiratory:  Positive for  shortness of breath and wheezing. Negative for cough and sputum production.   Cardiovascular:  Positive for orthopnea. Negative for chest pain, palpitations, claudication, leg swelling and PND.  Gastrointestinal:  Negative for abdominal pain, blood in stool, constipation, heartburn, nausea and vomiting.  Genitourinary:  Negative for dysuria, frequency, hematuria and urgency.  Musculoskeletal:  Positive for falls and joint pain. Negative for myalgias and neck pain.  Skin:  Negative for rash.  Neurological:  Positive for dizziness and weakness. Negative for tingling, focal weakness and headaches.  Psychiatric/Behavioral:  Positive for depression. The patient is not nervous/anxious.         Objective    BP 112/78   Pulse 78   Ht 5\' 9"  (1.753 m)   Wt 178 lb 12.8 oz (81.1 kg)   SpO2 98%   BMI 26.40 kg/m   Physical Exam Vitals and nursing note reviewed.  Constitutional:      Appearance: Normal appearance.  Cardiovascular:     Rate and Rhythm: Normal rate and regular rhythm.     Pulses: Normal pulses.     Heart sounds: Normal heart sounds.  Pulmonary:     Effort: Pulmonary effort is normal.     Breath sounds: Normal breath sounds. No wheezing or rales.  Abdominal:     General: Abdomen is flat. Bowel sounds are normal.     Palpations: Abdomen is soft.  Musculoskeletal:        General: Normal range of motion.  Neurological:     General: No focal deficit present.      Mental Status: He is alert and oriented to person, place, and time.        Assessment & Plan:  Patient advised to continue all his medications.   Refills for his medicines have been sent.  He still needs some blood work which we will do today.  Set up referral to ENT.   Patient will follow-up with his cardiologist as well. Problem List Items Addressed This Visit     PAF (paroxysmal atrial fibrillation) (H[facility])   Relevant Medications   amiodarone (PACERONE) 200 MG tablet   Hyperlipidemia   Relevant Medications   amiodarone (PACERONE) 200 MG tablet   Other Relevant Orders   Lipid Panel w/o Chol/HDL Ratio   Myalgia   Hypokalemia   Relevant Medications   potassium chloride SA (KLOR-CON M) 20 MEQ tablet   Other Visit Diagnoses     Presbycusis of both ears    -  Primary   Relevant Orders   Ambulatory referral to ENT   Chronic gout involving toe without tophus, unspecified cause, unspecified laterality       Relevant Medications   allopurinol (ZYLOPRIM) 300 MG tablet   Other Relevant Orders   Uric acid   Impaired glucose tolerance       Relevant Medications   JARDIANCE 10 MG TABS tablet   Other Relevant Orders   Hemoglobin A1c       Return in about 2 months (around 06/20/2022).   Total time spent: 40 minutes  Perrin Maltese, MD  04/20/2022

## 2022-04-21 LAB — LIPID PANEL W/O CHOL/HDL RATIO
Cholesterol, Total: 117 mg/dL (ref 100–199)
HDL: 30 mg/dL — ABNORMAL LOW (ref >39)
LDL Chol Calc (NIH): 69 mg/dL (ref 0–99)
Triglycerides: 96 mg/dL (ref 0–149)
VLDL Cholesterol Cal: 18 mg/dL (ref 5–40)

## 2022-04-21 LAB — HEMOGLOBIN A1C
Est. average glucose Bld gHb Est-mCnc: 126 mg/dL
Hgb A1c MFr Bld: 6 % — ABNORMAL HIGH (ref 4.8–5.6)

## 2022-04-21 LAB — URIC ACID: Uric Acid: 4.5 mg/dL (ref 3.8–8.4)

## 2022-04-25 ENCOUNTER — Other Ambulatory Visit: Payer: Self-pay

## 2022-05-01 MED ORDER — ESCITALOPRAM OXALATE 20 MG PO TABS: 20.0000 mg | ORAL_TABLET | Freq: Every day | ORAL | 3 refills | Status: DC

## 2022-05-04 ENCOUNTER — Encounter: Payer: Self-pay | Admitting: Internal Medicine

## 2022-05-05 ENCOUNTER — Emergency Department
Admission: EM | Admit: 2022-05-05 | Discharge: 2022-05-05 | Disposition: A | Discharge status: Home / Self Care | Payer: Medicare PPO | Attending: Emergency Medicine | Admitting: Emergency Medicine

## 2022-05-05 ENCOUNTER — Encounter: Payer: Self-pay | Admitting: Emergency Medicine

## 2022-05-05 ENCOUNTER — Emergency Department: Payer: Medicare PPO

## 2022-05-05 DIAGNOSIS — S2242XA Multiple fractures of ribs, left side, initial encounter for closed fracture: Secondary | ICD-10-CM | POA: Insufficient documentation

## 2022-05-05 DIAGNOSIS — S0990XA Unspecified injury of head, initial encounter: Secondary | ICD-10-CM | POA: Diagnosis not present

## 2022-05-05 DIAGNOSIS — I6782 Cerebral ischemia: Secondary | ICD-10-CM | POA: Insufficient documentation

## 2022-05-05 DIAGNOSIS — W19XXXA Unspecified fall, initial encounter: Secondary | ICD-10-CM

## 2022-05-05 DIAGNOSIS — J90 Pleural effusion, not elsewhere classified: Secondary | ICD-10-CM | POA: Insufficient documentation

## 2022-05-05 DIAGNOSIS — W01198A Fall on same level from slipping, tripping and stumbling with subsequent striking against other object, initial encounter: Secondary | ICD-10-CM | POA: Insufficient documentation

## 2022-05-05 DIAGNOSIS — Y92091 Bathroom in other non-institutional residence as the place of occurrence of the external cause: Secondary | ICD-10-CM | POA: Diagnosis not present

## 2022-05-05 NOTE — ED Provider Notes (Signed)
[facility] Provider Note  Patient Contact: 10:19 PM (approximate)   History   Fall   HPI  Curtis Wagner is a 82 y.o. male who is the emergency department with his daughter after a mechanical fall.  Patient slipped coming out of the restroom and landed on his left shoulder.  Patient reportedly hit his head but did not lose consciousness.  He is complaining of a "knot" to the head but no subsequent loss of consciousness, headache, visual changes.  His primary complaint is left shoulder pain.  He was at the orthopedic urgent care, was placed in a sling for comfort but was referred to the emergency department for evaluation of his head injury.  Daughter reports patient is at his baseline.  Patient denies any neck, chest pain.  No rib pain.  No shortness of breath.  No abdominal complaints.  No hip pain.     Physical Exam   Triage Vital Signs: ED Triage Vitals  Enc Vitals Group     BP 05/05/22 2116 122/65     Pulse Rate 05/05/22 2116 73     Resp 05/05/22 2116 18     Temp 05/05/22 2116 98.2 F (36.8 C)     Temp src --      SpO2 05/05/22 2116 98 %     Weight 05/05/22 2112 179 lb 0.2 oz (81.2 kg)     Height 05/05/22 2112  (1.753 m)     Head Circumference --      Peak Flow --      Pain Score 05/05/22 2113 9     Pain Loc --      Pain Edu? --      Excl. in GC? --     Most recent vital signs: Vitals:   05/05/22 2116  BP: 122/65  Pulse: 73  Resp: 18  Temp: 98.2 F (36.8 C)  SpO2: 98%     General: Alert and in no acute distress. Head: Small hematoma noted to left occipital skull.  No abrasion, laceration.  No battle signs, raccoon eyes, serosanguineous fluid drainage from the ears or nares.  Neck: No stridor. No cervical spine tenderness to palpation  Cardiovascular:  Good peripheral perfusion Respiratory: Normal respiratory effort without tachypnea or retractions. Lungs CTAB. Musculoskeletal: Full range of motion to all extremities.  Left  shoulder is in a sling at this time.  No visible deformity.  Patient has limited range of motion due to pain.  Tender along the anterior lateral aspect of the shoulder without palpable abnormality.  No special tests are performed at this time.  Examination of the ribs reveals no obvious deformity.  No crepitus.  No evidence of subcutaneous emphysema.  Good Breath sounds bilaterally. Neurologic:  No gross focal neurologic deficits are appreciated.  Skin:   No rash noted Other:   ED Results / Procedures / Treatments   Labs (all labs ordered are listed, but only abnormal results are displayed) Labs Reviewed - No data to display   EKG     RADIOLOGY  I personally viewed, evaluated, and interpreted these images as part of my medical decision making, as well as reviewing the written report by the radiologist.  ED Provider Interpretation: CT scan of the head and neck revealed no acute traumatic findings.  X-ray of the left clavicle and humerus reveals no fracture or dislocation of the shoulder.  Patient does have what appears to be 2 nondisplaced rib fractures to the left fifth and sixth  ribs.  DG Humerus Left  Result Date: 05/05/2022 CLINICAL DATA:  Left arm pain after fall EXAM: LEFT HUMERUS - 2+ VIEW; LEFT CLAVICLE - 2+ VIEWS COMPARISON:  None Available. FINDINGS: No acute fracture or dislocation of the left humerus or clavicle. Advanced hypertrophic degenerative arthritis left shoulder. Postoperative changes left shoulder. Age indeterminate fractures of the left 5th-6th ribs. IMPRESSION: No acute fracture of the left humerus or clavicle. Age-indeterminate fractures of the left fifth and sixth ribs. Recommend correlation with site of pain. Advanced arthritis left shoulder. Electronically Signed   By: Minerva Fester M.D.   On: 05/05/2022 21:56   DG Clavicle Left  Result Date: 05/05/2022 CLINICAL DATA:  Left arm pain after fall EXAM: LEFT HUMERUS - 2+ VIEW; LEFT CLAVICLE - 2+ VIEWS  COMPARISON:  None Available. FINDINGS: No acute fracture or dislocation of the left humerus or clavicle. Advanced hypertrophic degenerative arthritis left shoulder. Postoperative changes left shoulder. Age indeterminate fractures of the left 5th-6th ribs. IMPRESSION: No acute fracture of the left humerus or clavicle. Age-indeterminate fractures of the left fifth and sixth ribs. Recommend correlation with site of pain. Advanced arthritis left shoulder. Electronically Signed   By: Minerva Fester M.D.   On: 05/05/2022 21:56   CT HEAD WO CONTRAST ( )  Result Date: 05/05/2022 CLINICAL DATA:  Patient fell from standing today. On blood thinners. EXAM: CT HEAD WITHOUT CONTRAST CT CERVICAL SPINE WITHOUT CONTRAST TECHNIQUE: Multidetector CT imaging of the head and cervical spine was performed following the standard protocol without intravenous contrast. Multiplanar CT image reconstructions of the cervical spine were also generated. RADIATION DOSE REDUCTION: This exam was performed according to the departmental dose-optimization program which includes automated exposure control, adjustment of the mA and/or kV according to patient size and/or use of iterative reconstruction technique. COMPARISON:  Brain MRI 04/11/2022. FINDINGS: CT HEAD FINDINGS Brain: There is no evidence for acute hemorrhage, hydrocephalus, mass lesion, or abnormal extra-axial fluid collection. No definite CT evidence for acute infarction. Diffuse loss of parenchymal volume is consistent with atrophy. Patchy low attenuation in the deep hemispheric and periventricular white matter is nonspecific, but likely reflects chronic microvascular ischemic demyelination. Vascular: No hyperdense vessel or unexpected calcification. Skull: No evidence for fracture. No worrisome lytic or sclerotic lesion. Sinuses/Orbits: The visualized paranasal sinuses and mastoid air cells are clear. Visualized portions of the globes and intraorbital fat are unremarkable. Other:  None. CT CERVICAL SPINE FINDINGS Alignment: Mild straightening of upper cervical lordosis. No findings to suggest traumatic subluxation. Skull base and vertebrae: No acute fracture. No primary bone lesion or focal pathologic process. Soft tissues and spinal canal: No prevertebral fluid or swelling. No visible canal hematoma. Disc levels: Mild loss of disc height noted C3-4 C4-5 and C5-6. Mild facet osteoarthritis noted upper and mid cervical levels. Upper chest: Bilateral pleural effusions evident. Other: None. IMPRESSION: 1. No acute intracranial abnormality. 2. Atrophy with chronic small vessel ischemic disease. 3. No evidence for cervical spine fracture or traumatic subluxation. 4. Bilateral pleural effusions. Consider chest x-ray to better evaluate. Electronically Signed   By: Kennith Center M.D.   On: 05/05/2022 21:49   CT Cervical Spine Wo Contrast  Result Date: 05/05/2022 CLINICAL DATA:  Patient fell from standing today. On blood thinners. EXAM: CT HEAD WITHOUT CONTRAST CT CERVICAL SPINE WITHOUT CONTRAST TECHNIQUE: Multidetector CT imaging of the head and cervical spine was performed following the standard protocol without intravenous contrast. Multiplanar CT image reconstructions of the cervical spine were also generated. RADIATION DOSE REDUCTION: This exam  was performed according to the departmental dose-optimization program which includes automated exposure control, adjustment of the mA and/or kV according to patient size and/or use of iterative reconstruction technique. COMPARISON:  Brain MRI 04/11/2022. FINDINGS: CT HEAD FINDINGS Brain: There is no evidence for acute hemorrhage, hydrocephalus, mass lesion, or abnormal extra-axial fluid collection. No definite CT evidence for acute infarction. Diffuse loss of parenchymal volume is consistent with atrophy. Patchy low attenuation in the deep hemispheric and periventricular white matter is nonspecific, but likely reflects chronic microvascular ischemic  demyelination. Vascular: No hyperdense vessel or unexpected calcification. Skull: No evidence for fracture. No worrisome lytic or sclerotic lesion. Sinuses/Orbits: The visualized paranasal sinuses and mastoid air cells are clear. Visualized portions of the globes and intraorbital fat are unremarkable. Other: None. CT CERVICAL SPINE FINDINGS Alignment: Mild straightening of upper cervical lordosis. No findings to suggest traumatic subluxation. Skull base and vertebrae: No acute fracture. No primary bone lesion or focal pathologic process. Soft tissues and spinal canal: No prevertebral fluid or swelling. No visible canal hematoma. Disc levels: Mild loss of disc height noted C3-4 C4-5 and C5-6. Mild facet osteoarthritis noted upper and mid cervical levels. Upper chest: Bilateral pleural effusions evident. Other: None. IMPRESSION: 1. No acute intracranial abnormality. 2. Atrophy with chronic small vessel ischemic disease. 3. No evidence for cervical spine fracture or traumatic subluxation. 4. Bilateral pleural effusions. Consider chest x-ray to better evaluate. Electronically Signed   By: Kennith Center M.D.   On: 05/05/2022 21:49    PROCEDURES:  Critical Care performed: No  Procedures   MEDICATIONS ORDERED IN ED: Medications - No data to display   IMPRESSION / MDM / ASSESSMENT AND PLAN / ED COURSE  I reviewed the triage vital signs and the nursing notes.                                 Differential diagnosis includes, but is not limited to, fall, skull fracture, intracranial hemorrhage, concussion, cervical spine injury, shoulder dislocation, shoulder fracture, clavicle fracture, rib fracture  Patient's presentation is most consistent with acute presentation with potential threat to life or bodily function.   Patient's diagnosis is consistent with fall, left rib fractures.  Patient presents emergency department after mechanical fall.  He fell on his left shoulder and side.  Did hit his head but  did not lose consciousness.  He went to the orthopedic urgent care, was referred to the emergency department for CT scan of his head and neck given the fact that he did hit his head.  CT scans are reassuring.  X-rays revealed no fracture or dislocation of the shoulder but does show what appears to be 2 nondisplaced rib fractures to the left ribs.  Care instructions discussed with the patient's daughter.  Patient stable for discharge at this time.  Follow-up with primary care as needed..  Patient is given ED precautions to return to the ED for any worsening or new symptoms.     FINAL CLINICAL IMPRESSION(S) / ED DIAGNOSES   Final diagnoses:  Fall, initial encounter  Closed fracture of multiple ribs of left side, initial encounter     Rx / DC Orders   ED Discharge Orders     None        Note:  This document was prepared using Dragon voice recognition software and may include unintentional dictation errors.   Lanette Hampshire 05/05/22 2223    Pilar Jarvis, MD  05/07/22 0040  

## 2022-05-05 NOTE — ED Notes (Signed)
Pt taken to CT in transport to room 41- primary RN notified of the patients location.

## 2022-05-05 NOTE — ED Triage Notes (Signed)
Pt presents ambulatory to triage via POV with complaints of fall today. Pt on blood thinner, he notes losing his balance after getting up from his chair and landed on his L shoulder (Pt currently in a sling). Pt was seen at [facility] today for XR but was referred here due to needing a head CT. Pt has a small abrasion to the L side of his head. Denies LOC. A&Ox4 at this time. Denies CP or SOB.

## 2022-05-07 ENCOUNTER — Encounter: Payer: Self-pay | Admitting: Family

## 2022-05-09 ENCOUNTER — Ambulatory Visit (INDEPENDENT_AMBULATORY_CARE_PROVIDER_SITE_OTHER): Payer: Medicare PPO | Admitting: Vascular Surgery

## 2022-05-09 ENCOUNTER — Encounter (INDEPENDENT_AMBULATORY_CARE_PROVIDER_SITE_OTHER): Payer: Self-pay | Admitting: Vascular Surgery

## 2022-05-09 VITALS — BP 106/68 | HR 76 | Resp 18 | Ht 71.5 in | Wt 179.2 lb

## 2022-05-09 DIAGNOSIS — I70222 Atherosclerosis of native arteries of extremities with rest pain, left leg: Secondary | ICD-10-CM

## 2022-05-09 DIAGNOSIS — I10 Essential (primary) hypertension: Secondary | ICD-10-CM

## 2022-05-09 DIAGNOSIS — N1832 Chronic kidney disease, stage 3b: Secondary | ICD-10-CM

## 2022-05-09 DIAGNOSIS — E785 Hyperlipidemia, unspecified: Secondary | ICD-10-CM

## 2022-05-09 DIAGNOSIS — E1122 Type 2 diabetes mellitus with diabetic chronic kidney disease: Secondary | ICD-10-CM | POA: Diagnosis not present

## 2022-05-09 NOTE — Assessment & Plan Note (Signed)
blood glucose control important in reducing the progression of atherosclerotic disease. Also, involved in wound healing. On appropriate medications.  

## 2022-05-09 NOTE — Assessment & Plan Note (Signed)
lipid control important in reducing the progression of atherosclerotic disease. Continue statin therapy  

## 2022-05-09 NOTE — Assessment & Plan Note (Signed)
The patient underwent extensive revascularization of the left lower extremity for ischemic rest pain with marked improvement.  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  The reperfusion swelling as expected.  Compression socks and elevation.  He was recommended to walk is much as possible.  Continue current medications including anticoagulation.  Recheck in 2 to 3 months with noninvasive studies.

## 2022-05-09 NOTE — Assessment & Plan Note (Signed)
blood pressure control important in reducing the progression of atherosclerotic disease. On appropriate oral medications.  

## 2022-05-09 NOTE — Progress Notes (Signed)
MRN : [id number]  Fontaine Hehl is a 82 y.o. (1940-03-11) male who presents with chief complaint of  Chief Complaint  Patient presents with   Follow-up    2 week follow up  .  History of Present Illness: Patient returns today in follow up of his left lower extremity ischemia.  Few weeks ago, he underwent extensive left lower extremity revascularization including thrombolytic therapy.  He had acute limb threatening ischemia with several weeks of rest pain and falls.  We were able to revascularize the left lower extremity which was somewhat surprising.  He is currently not having any pain.  He does have reperfusion swelling in the left leg which is not unexpected.  Current Outpatient Medications  Medication Sig Dispense Refill   allopurinol (ZYLOPRIM) 300 MG tablet Take 1 tablet (300 mg total) by mouth daily. Last dispense not on file 90 tablet 3   amiodarone (PACERONE) 200 MG tablet Take 1 tablet (200 mg total) by mouth 2 (two) times daily. 180 tablet 3   apixaban (ELIQUIS) 5 MG TABS tablet Take 1 tablet (5 mg total) by mouth 2 (two) times daily. 60 tablet 0   atorvastatin (LIPITOR) 80 MG tablet Take 80 mg by mouth daily. Last dispense 03/2022 30DS     clopidogrel (PLAVIX) 75 MG tablet Take 1 tablet (75 mg total) by mouth daily. 30 tablet 0   escitalopram (LEXAPRO) 20 MG tablet Take 1 tablet (20 mg total) by mouth daily. 30 tablet 3   JARDIANCE 10 MG TABS tablet Take 1 tablet (10 mg total) by mouth every morning. Last dispense 02/2022 30DS 90 tablet 3   metoprolol succinate (TOPROL-XL) 25 MG 24 hr tablet Take 1 tablet (25 mg total) by mouth daily. Take with or immediately following a meal. 30 tablet 0   Multiple Vitamins-Minerals (CENTRUM SILVER ADULT 50+) TABS Take 1 tablet by mouth daily.     nitroGLYCERIN (NITROSTAT) 0.4 MG SL tablet Place 0.4 mg under the tongue every 5 (five) minutes as needed for chest pain.     potassium chloride SA (KLOR-CON M) 20 MEQ tablet Take 2 tablets (40 mEq  total) by mouth daily. Last dispense 12/2021 30DS 90 tablet 3   tamsulosin (FLOMAX) 0.4 MG CAPS capsule Take 0.4 mg by mouth at bedtime. Last dispense 03/2022 30DS     No current facility-administered medications for this visit.    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   DVT (deep venous thrombosis)    History of kidney stones    Hyperlipidemia    Hypertension    PAF (paroxysmal atrial fibrillation)     Past Surgical History:  Procedure Laterality Date   EYE SURGERY Right    /w IOL   LOWER EXTREMITY ANGIOGRAPHY Left 04/12/2022   Procedure: Lower Extremity Angiography;  Surgeon: Annice Needy, MD;  Location: [facility] INVASIVE CV LAB;  Service: Cardiovascular;  Laterality: Left;   LOWER EXTREMITY INTERVENTION Left 04/13/2022   Procedure: LOWER EXTREMITY INTERVENTION;  Surgeon: Annice Needy, MD;  Location: [facility] INVASIVE CV LAB;  Service: Cardiovascular;  Laterality: Left;   SHOULDER SURGERY Left 1961   TONSILLECTOMY     TOTAL SHOULDER ARTHROPLASTY Right 01/01/2015   Procedure: RIGHT TOTAL SHOULDER ARTHROPLASTY;  Surgeon: Loreta Ave, MD;  [facility];  Service: Orthopedics;  Laterality: Right;     Social History   Tobacco Use   Smoking status: Former  Smokeless tobacco: Former    Types: Associate Professor Use: Never used  Substance Use Topics   Alcohol use: No    Alcohol/week: 0.0 standard drinks of alcohol   Drug use: No      Family History  Problem Relation Age of Onset   Rheumatic fever Mother    Stroke Mother    Heart disease Mother    Stroke Father      Allergies  Allergen Reactions   Morphine Other (See Comments)   Morphine And Related Other (See Comments)    HALLUCINATIONS     REVIEW OF SYSTEMS (Negative unless checked)  Constitutional: [] Weight loss  [] Fever  [] Chills Cardiac: [] Chest pain   [] Chest pressure   [] Palpitations   [] Shortness of breath when laying  flat   [] Shortness of breath at rest   [] Shortness of breath with exertion. Vascular:  [] Pain in legs with walking   [] Pain in legs at rest   [] Pain in legs when laying flat   [] Claudication   [] Pain in feet when walking  [] Pain in feet at rest  [] Pain in feet when laying flat   [] History of DVT   [] Phlebitis   [x] Swelling in legs   [] Varicose veins   [] Non-healing ulcers Pulmonary:   [] Uses home oxygen   [] Productive cough   [] Hemoptysis   [] Wheeze  [] COPD   [] Asthma Neurologic:  [] Dizziness  [] Blackouts   [] Seizures   [] History of stroke   [] History of TIA  [] Aphasia   [] Temporary blindness   [] Dysphagia   [] Weakness or numbness in arms   [x] Weakness or numbness in legs X memory issues Musculoskeletal:  [x] Arthritis   [] Joint swelling   [] Joint pain   [] Low back pain Hematologic:  [] Easy bruising  [] Easy bleeding   [] Hypercoagulable state   [] Anemic   Gastrointestinal:  [] Blood in stool   [] Vomiting blood  [] Gastroesophageal reflux/heartburn   [] Abdominal pain Genitourinary:  [] Chronic kidney disease   [] Difficult urination  [] Frequent urination  [] Burning with urination   [] Hematuria Skin:  [] Rashes   [] Ulcers   [] Wounds Psychological:  [] History of anxiety   []  History of major depression.  Physical Examination  BP 106/68 (BP Location: Right Arm)   Pulse 76   Resp 18   Ht 5' 11.5" (1.816 m)   Wt 179 lb 3.2 oz (81.3 kg)   BMI 24.64 kg/m  Gen:  WD/WN, NAD Head: [place]/AT, No temporalis wasting. Ear/Nose/Throat: Hearing grossly intact, nares w/o erythema or drainage Eyes: Conjunctiva clear. Sclera non-icteric Neck: Supple.  Trachea midline Pulmonary:  Good air movement, no use of accessory muscles.  Cardiac: RRR, no JVD Vascular:  Vessel Right Left  Radial Palpable Palpable                          PT 1+ Palpable 1+ Palpable  DP 2+ Palpable 1+ Palpable   Gastrointestinal: soft, non-tender/non-distended. No guarding/reflex.  Musculoskeletal: M/S 5/5 throughout.  No deformity or  atrophy.  Trace right lower edema, 1-2+ left lower extremity edema. Neurologic: Sensation grossly intact in extremities.  Symmetrical.  Speech is fluent.  Psychiatric: Judgment  and insight are fair at best Dermatologic: No rashes or ulcers noted.  No cellulitis or open wounds.      Labs Recent Results (from the past 2160 hour(s))  Basic metabolic panel     Status: Abnormal   Collection Time: 04/11/22  4:25 PM  Result Value Ref Range   Sodium  136 135 - 145 mmol/L   Potassium 3.6 3.5 - 5.1 mmol/L   Chloride 103 98 - 111 mmol/L   CO2 23 22 - 32 mmol/L   Glucose, Bld 119 (H) 70 - 99 mg/dL    Comment: Glucose reference range applies only to samples taken after fasting for at least 8 hours.   BUN 15 8 - 23 mg/dL   Creatinine, Ser 1.61 (H) 0.61 - 1.24 mg/dL   Calcium 8.6 (L) 8.9 - 10.3 mg/dL   GFR, Estimated 48 (L) >60 mL/min    Comment: (NOTE) Calculated using the CKD-EPI Creatinine Equation (2021)    Anion gap 10 5 - 15    Comment: Performed at [facility], [address] [place], [place] [address]  CBC     Status: None   Collection Time: 04/11/22  4:25 PM  Result Value Ref Range   WBC 5.5 4.0 - 10.5 K/uL   RBC 4.86 4.22 - 5.81 MIL/uL   Hemoglobin 15.8 13.0 - 17.0 g/dL   HCT 54.0 98.1 - 19.1 %   MCV 99.0 80.0 - 100.0 fL   MCH 32.5 26.0 - 34.0 pg   MCHC 32.8 30.0 - 36.0 g/dL   RDW 47.8 29.5 - 62.1 %   Platelets 181 150 - 400 K/uL   nRBC 0.0 0.0 - 0.2 %    Comment: Performed at [facility], [address] [place], [place] [address]  Troponin I (High Sensitivity)     Status: Abnormal   Collection Time: 04/11/22  8:48 PM  Result Value Ref Range   Troponin I (High Sensitivity) 20 (H) <18 ng/L    Comment: (NOTE) Elevated high sensitivity troponin I (hsTnI) values and significant  changes across serial measurements may suggest ACS but many other  chronic and acute conditions are known to elevate hsTnI results.  Refer to the "Links" section for chest  pain algorithms and additional  guidance. Performed at [facility], [address] [place], [place] [address]   TSH     Status: None   Collection Time: 04/11/22  8:48 PM  Result Value Ref Range   TSH 2.204 0.350 - 4.500 uIU/mL    Comment: Performed by a 3rd Generation assay with a functional sensitivity of <=0.01 uIU/mL. Performed at [facility], [address] [place], [place] [address]   Urinalysis, Routine w reflex microscopic -Urine, Clean Catch     Status: Abnormal   Collection Time: 04/11/22  9:35 PM  Result Value Ref Range   Color, Urine YELLOW (A) YELLOW   APPearance CLEAR (A) CLEAR   Specific Gravity, Urine 1.025 1.005 - 1.030   pH 5.0 5.0 - 8.0   Glucose, UA >=500 (A) NEGATIVE mg/dL   Hgb urine dipstick SMALL (A) NEGATIVE   Bilirubin Urine NEGATIVE NEGATIVE   Ketones, ur NEGATIVE NEGATIVE mg/dL   Protein, ur 30 (A) NEGATIVE mg/dL   Nitrite NEGATIVE NEGATIVE   Leukocytes,Ua NEGATIVE NEGATIVE   RBC / HPF 0-5 0 - 5 RBC/hpf   WBC, UA 0-5 0 - 5 WBC/hpf   Bacteria, UA NONE SEEN NONE SEEN   Squamous Epithelial / HPF 0-5 0 - 5 /HPF   Mucus PRESENT     Comment: Performed at [facility], [address] [place], [place] [address]  Troponin I (High Sensitivity)     Status: Abnormal   Collection Time: 04/11/22  9:35 PM  Result Value Ref Range   Troponin I (High Sensitivity) 22 (H) <18  ng/L    Comment: (NOTE) Elevated high sensitivity troponin I (hsTnI) values and significant  changes across serial measurements may suggest ACS but many other  chronic and acute conditions are known to elevate hsTnI results.  Refer to the "Links" section for chest pain algorithms and additional  guidance. Performed at [facility], [address] [place], [place] [address]   CK     Status: None   Collection Time: 04/12/22 12:15 AM  Result Value Ref Range   Total CK 276 49 - 397 U/L    Comment: Performed at [facility], [address] [place], [place] [address]  Lactic acid, plasma     Status: None   Collection Time: 04/12/22 12:15 AM  Result Value Ref Range   Lactic Acid, Venous 1.9 0.5 - 1.9 mmol/L    Comment: Performed at [facility], [address] [place], [place] [address]  D-dimer, quantitative     Status: Abnormal   Collection Time: 04/12/22 12:15 AM  Result Value Ref Range   D-Dimer, Quant 12.51 (H) 0.00 - 0.50 ug/mL-FEU    Comment: (NOTE) At the manufacturer cut-off value of 0.5 g/mL FEU, this assay has a negative predictive value of 95-100%.This assay is intended for use in conjunction with a clinical pretest probability (PTP) assessment model to exclude pulmonary embolism (PE) and deep venous thrombosis (DVT) in outpatients suspected of PE or DVT. Results should be correlated with clinical presentation. Performed at [facility], [address] [place], [place] [address]   Comprehensive metabolic panel     Status: Abnormal   Collection Time: 04/12/22  1:39 AM  Result Value Ref Range   Sodium 135 135 - 145 mmol/L   Potassium 3.4 (L) 3.5 - 5.1 mmol/L   Chloride 104 98 - 111 mmol/L   CO2 22 22 - 32 mmol/L   Glucose, Bld 105 (H) 70 - 99 mg/dL    Comment: Glucose reference range applies only to samples taken after fasting for at least 8 hours.   BUN 16 8 - 23 mg/dL   Creatinine, Ser 6.57 (H) 0.61 - 1.24 mg/dL   Calcium 7.9 (L) 8.9 - 10.3 mg/dL   Total Protein 5.9 (L) 6.5 - 8.1 g/dL   Albumin 2.9 (L) 3.5 - 5.0 g/dL   AST 32 15 - 41 U/L   ALT 17 0 - 44 U/L   Alkaline Phosphatase 105 38 - 126 U/L   Total Bilirubin 1.9 (H) 0.3 - 1.2 mg/dL   GFR, Estimated 53 (L) >60 mL/min    Comment: (NOTE) Calculated using the CKD-EPI Creatinine Equation (2021)    Anion gap 9 5 - 15    Comment: Performed at [facility], [address] [place], [place] [address]  Lactic acid, plasma     Status: Abnormal   Collection Time: 04/12/22  2:15 AM  Result Value Ref Range   Lactic  Acid, Venous 2.0 (HH) 0.5 - 1.9 mmol/L    Comment: CRITICAL RESULT CALLED TO, READ BACK BY AND VERIFIED WITH [email or web address] [email or web address]  04/12/22 RH Performed at [facility], [address] [place], [place] [address]   CBC     Status: Abnormal   Collection Time: 04/12/22  5:10 AM  Result Value Ref Range   WBC 4.6 4.0 - 10.5 K/uL   RBC 4.18 (L) 4.22 - 5.81 MIL/uL   Hemoglobin 13.7 13.0 - 17.0 g/dL   HCT 41.3 24.4 - 01.0 %   MCV 97.8 80.0 -  100.0 fL   MCH 32.8 26.0 - 34.0 pg   MCHC 33.5 30.0 - 36.0 g/dL   RDW 40.9 81.1 - 91.4 %   Platelets 135 (L) 150 - 400 K/uL   nRBC 0.0 0.0 - 0.2 %    Comment: Performed at [facility], [address] [place], [place] [address]  APTT     Status: None   Collection Time: 04/12/22  5:10 AM  Result Value Ref Range   aPTT 36 24 - 36 seconds    Comment: Performed at [facility], [address] [place], [place] [address]  Protime-INR     Status: Abnormal   Collection Time: 04/12/22  5:10 AM  Result Value Ref Range   Prothrombin Time 16.6 (H) 11.4 - 15.2 seconds   INR 1.4 (H) 0.8 - 1.2    Comment: (NOTE) INR goal varies based on device and disease states. Performed at [facility], [address] [place], [place] [address]   Heparin level (unfractionated)     Status: Abnormal   Collection Time: 04/12/22  5:10 AM  Result Value Ref Range   Heparin Unfractionated <0.10 (L) 0.30 - 0.70 IU/mL    Comment: (NOTE) The clinical reportable range upper limit is being lowered to >1.10 to align with the FDA approved guidance for the current laboratory assay.  If heparin results are below expected values, and patient dosage has  been confirmed, suggest follow up testing of antithrombin III levels. Performed at [facility], [address] [place], [place] [address]   Lactic acid, plasma     Status: None   Collection Time: 04/12/22  7:16 AM  Result Value Ref Range   Lactic Acid, Venous 1.2 0.5 - 1.9 mmol/L     Comment: Performed at [facility], [address] [place], [place] [address]  ECHOCARDIOGRAM COMPLETE     Status: None   Collection Time: 04/12/22 10:52 AM  Result Value Ref Range   Weight 2,787.2 oz   Height 71 in   BP 130/88 mmHg   Ao pk vel 1.02 m/s   AV Area VTI 2.49 cm2   AR max vel 2.36 cm2   AV Mean grad 2.3 mmHg   AV Peak grad 4.1 mmHg   Single Plane A2C EF 26.5 %   Single Plane A4C EF 18.9 %   Calc EF 24.2 %   S' Lateral 5.50 cm   AV Area mean vel 2.27 cm2   Area-P 1/2 4.46 cm2   P 1/2 time 552 msec   MV VTI 1.76 cm2   Est EF < 20%   Surgical PCR screen     Status: None   Collection Time: 04/12/22 11:12 AM   Specimen: Nasal Mucosa; Nasal Swab  Result Value Ref Range   MRSA, PCR NEGATIVE NEGATIVE   Staphylococcus aureus NEGATIVE NEGATIVE    Comment: (NOTE) The Xpert SA Assay (FDA approved for NASAL specimens in patients 84 years of age and older), is one component of a comprehensive surveillance program. It is not intended to diagnose infection nor to guide or monitor treatment. Performed at [facility], [address] [place], [place] [address]   Lactic acid, plasma     Status: None   Collection Time: 04/12/22  1:29 PM  Result Value Ref Range   Lactic Acid, Venous 1.2 0.5 - 1.9 mmol/L    Comment: Performed at [facility], [address] [place], [place] [address]  Glucose, capillary  Status: None   Collection Time: 04/12/22  6:07 PM  Result Value Ref Range   Glucose-Capillary 87 70 - 99 mg/dL    Comment: Glucose reference range applies only to samples taken after fasting for at least 8 hours.  Heparin level (unfractionated) every 6 hours x 4 post-procedure     Status: None   Collection Time: 04/12/22  6:36 PM  Result Value Ref Range   Heparin Unfractionated 0.57 0.30 - 0.70 IU/mL    Comment: (NOTE) The clinical reportable range upper limit is being lowered to >1.10 to align with the FDA approved guidance for the  current laboratory assay.  If heparin results are below expected values, and patient dosage has  been confirmed, suggest follow up testing of antithrombin III levels. Performed at [facility], [address] [place], [place] [address]   CBC every 6 hours x 4 post-procedure     Status: Abnormal   Collection Time: 04/12/22  6:36 PM  Result Value Ref Range   WBC 5.3 4.0 - 10.5 K/uL   RBC 4.34 4.22 - 5.81 MIL/uL   Hemoglobin 14.5 13.0 - 17.0 g/dL   HCT 19.1 47.8 - 29.5 %   MCV 98.6 80.0 - 100.0 fL   MCH 33.4 26.0 - 34.0 pg   MCHC 33.9 30.0 - 36.0 g/dL   RDW 62.1 30.8 - 65.7 %   Platelets 132 (L) 150 - 400 K/uL   nRBC 0.0 0.0 - 0.2 %    Comment: Performed at [facility], [address] [place], [place] [address]  Fibrinogen every 6 hours x 4 post-procedure     Status: None   Collection Time: 04/12/22  6:36 PM  Result Value Ref Range   Fibrinogen 217 210 - 475 mg/dL    Comment: (NOTE) Fibrinogen results may be underestimated in patients receiving thrombolytic therapy. Performed at [facility], [address] [place], [place] [address]   Heparin level (unfractionated) every 6 hours x 4 post-procedure     Status: Abnormal   Collection Time: 04/13/22 12:10 AM  Result Value Ref Range   Heparin Unfractionated 0.15 (L) 0.30 - 0.70 IU/mL    Comment: (NOTE) The clinical reportable range upper limit is being lowered to >1.10 to align with the FDA approved guidance for the current laboratory assay.  If heparin results are below expected values, and patient dosage has  been confirmed, suggest follow up testing of antithrombin III levels. Performed at [facility], [address] [place], [place] [address]   CBC every 6 hours x 4 post-procedure     Status: Abnormal   Collection Time: 04/13/22 12:10 AM  Result Value Ref Range   WBC 5.9 4.0 - 10.5 K/uL   RBC 4.41 4.22 - 5.81 MIL/uL   Hemoglobin 14.5 13.0 - 17.0 g/dL   HCT 40.1 02.7 - 25.3  %   MCV 99.1 80.0 - 100.0 fL   MCH 32.9 26.0 - 34.0 pg   MCHC 33.2 30.0 - 36.0 g/dL   RDW 66.4 40.3 - 47.4 %   Platelets 143 (L) 150 - 400 K/uL   nRBC 0.0 0.0 - 0.2 %    Comment: Performed at [facility], [address] [place], [place] [address]  Fibrinogen every 6 hours x 4 post-procedure     Status: Abnormal   Collection Time: 04/13/22 12:10 AM  Result Value Ref Range   Fibrinogen 202 (L) 210 - 475 mg/dL    Comment: (NOTE) Fibrinogen results  may be underestimated in patients receiving thrombolytic therapy. Performed at [facility], [address] [place], [place] [address]   Heparin level (unfractionated) every 6 hours x 4 post-procedure     Status: Abnormal   Collection Time: 04/13/22  4:52 AM  Result Value Ref Range   Heparin Unfractionated <0.10 (L) 0.30 - 0.70 IU/mL    Comment: (NOTE) The clinical reportable range upper limit is being lowered to >1.10 to align with the FDA approved guidance for the current laboratory assay.  If heparin results are below expected values, and patient dosage has  been confirmed, suggest follow up testing of antithrombin III levels. Performed at [facility], [address] [place], [place] [address]   CBC every 6 hours x 4 post-procedure     Status: Abnormal   Collection Time: 04/13/22  4:52 AM  Result Value Ref Range   WBC 5.5 4.0 - 10.5 K/uL   RBC 4.19 (L) 4.22 - 5.81 MIL/uL   Hemoglobin 13.6 13.0 - 17.0 g/dL   HCT 82.9 56.2 - 13.0 %   MCV 99.3 80.0 - 100.0 fL   MCH 32.5 26.0 - 34.0 pg   MCHC 32.7 30.0 - 36.0 g/dL   RDW 86.5 78.4 - 69.6 %   Platelets 120 (L) 150 - 400 K/uL   nRBC 0.0 0.0 - 0.2 %    Comment: Performed at [facility], [address] [place], [place] [address]  Fibrinogen every 6 hours x 4 post-procedure     Status: Abnormal   Collection Time: 04/13/22  4:52 AM  Result Value Ref Range   Fibrinogen 195 (L) 210 - 475 mg/dL    Comment: (NOTE) Fibrinogen results may be  underestimated in patients receiving thrombolytic therapy. Performed at [facility], [address] [place], [place] [address]   Comprehensive metabolic panel     Status: Abnormal   Collection Time: 04/13/22  4:52 AM  Result Value Ref Range   Sodium 132 (L) 135 - 145 mmol/L   Potassium 3.4 (L) 3.5 - 5.1 mmol/L   Chloride 106 98 - 111 mmol/L   CO2 21 (L) 22 - 32 mmol/L   Glucose, Bld 95 70 - 99 mg/dL    Comment: Glucose reference range applies only to samples taken after fasting for at least 8 hours.   BUN 17 8 - 23 mg/dL   Creatinine, Ser 4.01 (H) 0.61 - 1.24 mg/dL   Calcium 7.6 (L) 8.9 - 10.3 mg/dL   Total Protein 5.5 (L) 6.5 - 8.1 g/dL   Albumin 2.8 (L) 3.5 - 5.0 g/dL   AST 31 15 - 41 U/L   ALT 17 0 - 44 U/L   Alkaline Phosphatase 111 38 - 126 U/L   Total Bilirubin 2.0 (H) 0.3 - 1.2 mg/dL   GFR, Estimated 54 (L) >60 mL/min    Comment: (NOTE) Calculated using the CKD-EPI Creatinine Equation (2021)    Anion gap 5 5 - 15    Comment: Performed at [facility], [address] [place], [place] [address]  Magnesium     Status: None   Collection Time: 04/13/22  4:52 AM  Result Value Ref Range   Magnesium 1.9 1.7 - 2.4 mg/dL    Comment: Performed at [facility], [address] [place], [place] [address]  Phosphorus     Status: None   Collection Time: 04/13/22  4:52 AM  Result Value Ref Range   Phosphorus 3.5 2.5 - 4.6 mg/dL  Comment: Performed at [facility], [address] [place], [place] [address]  C-reactive protein     Status: Abnormal   Collection Time: 04/13/22  4:52 AM  Result Value Ref Range   CRP 5.4 (H) <1.0 mg/dL    Comment: Performed at [facility], [address] [place], [place] [address]  CK     Status: None   Collection Time: 04/13/22  4:52 AM  Result Value Ref Range   Total CK 299 49 - 397 U/L    Comment: Performed at [facility], [address] [place], [place] [address]  CBC     Status:  Abnormal   Collection Time: 04/14/22  3:02 AM  Result Value Ref Range   WBC 5.2 4.0 - 10.5 K/uL   RBC 3.86 (L) 4.22 - 5.81 MIL/uL   Hemoglobin 12.6 (L) 13.0 - 17.0 g/dL   HCT 21.3 (L) 08.6 - 57.8 %   MCV 99.2 80.0 - 100.0 fL   MCH 32.6 26.0 - 34.0 pg   MCHC 32.9 30.0 - 36.0 g/dL   RDW 46.9 62.9 - 52.8 %   Platelets 121 (L) 150 - 400 K/uL   nRBC 0.0 0.0 - 0.2 %    Comment: Performed at [facility], [address] [place], [place] [address]  Comprehensive metabolic panel     Status: Abnormal   Collection Time: 04/14/22  3:02 AM  Result Value Ref Range   Sodium 138 135 - 145 mmol/L   Potassium 4.2 3.5 - 5.1 mmol/L   Chloride 105 98 - 111 mmol/L   CO2 21 (L) 22 - 32 mmol/L   Glucose, Bld 122 (H) 70 - 99 mg/dL    Comment: Glucose reference range applies only to samples taken after fasting for at least 8 hours.   BUN 20 8 - 23 mg/dL   Creatinine, Ser 4.01 (H) 0.61 - 1.24 mg/dL   Calcium 8.3 (L) 8.9 - 10.3 mg/dL   Total Protein 5.4 (L) 6.5 - 8.1 g/dL   Albumin 2.6 (L) 3.5 - 5.0 g/dL   AST 29 15 - 41 U/L   ALT 14 0 - 44 U/L   Alkaline Phosphatase 106 38 - 126 U/L   Total Bilirubin 1.1 0.3 - 1.2 mg/dL   GFR, Estimated 50 (L) >60 mL/min    Comment: (NOTE) Calculated using the CKD-EPI Creatinine Equation (2021)    Anion gap 12 5 - 15    Comment: Performed at [facility], [address], [place], [place] [address]  Magnesium     Status: None   Collection Time: 04/14/22  3:02 AM  Result Value Ref Range   Magnesium 1.8 1.7 - 2.4 mg/dL    Comment: Performed at [facility], [address], [place], [place] [address]  Phosphorus     Status: None   Collection Time: 04/14/22  3:02 AM  Result Value Ref Range   Phosphorus 2.6 2.5 - 4.6 mg/dL    Comment: Performed at [facility], [address] [place], [place] [address]  C-reactive protein     Status: Abnormal   Collection Time: 04/14/22  3:02 AM  Result Value Ref Range   CRP 6.3 (H) <1.0  mg/dL    Comment: Performed at [facility], [address], [place], [place] [address]  CK     Status: None   Collection Time: 04/14/22  3:02 AM  Result Value Ref Range   Total CK 130  49 - 397 U/L    Comment: Performed at [facility], [address] [place], [place] [address]  CBC     Status: Abnormal   Collection Time: 04/15/22  4:02 AM  Result Value Ref Range   WBC 5.1 4.0 - 10.5 K/uL   RBC 3.78 (L) 4.22 - 5.81 MIL/uL   Hemoglobin 12.4 (L) 13.0 - 17.0 g/dL   HCT 60.4 (L) 54.0 - 98.1 %   MCV 97.9 80.0 - 100.0 fL   MCH 32.8 26.0 - 34.0 pg   MCHC 33.5 30.0 - 36.0 g/dL   RDW 19.1 47.8 - 29.5 %   Platelets 115 (L) 150 - 400 K/uL   nRBC 0.0 0.0 - 0.2 %    Comment: Performed at [facility], [address] [place], [place] [address]  Comprehensive metabolic panel     Status: Abnormal   Collection Time: 04/15/22  4:02 AM  Result Value Ref Range   Sodium 133 (L) 135 - 145 mmol/L   Potassium 3.3 (L) 3.5 - 5.1 mmol/L   Chloride 106 98 - 111 mmol/L   CO2 21 (L) 22 - 32 mmol/L   Glucose, Bld 128 (H) 70 - 99 mg/dL    Comment: Glucose reference range applies only to samples taken after fasting for at least 8 hours.   BUN 17 8 - 23 mg/dL   Creatinine, Ser 8.65 (H) 0.61 - 1.24 mg/dL   Calcium 7.8 (L) 8.9 - 10.3 mg/dL   Total Protein 5.3 (L) 6.5 - 8.1 g/dL   Albumin 2.6 (L) 3.5 - 5.0 g/dL   AST 30 15 - 41 U/L   ALT 12 0 - 44 U/L   Alkaline Phosphatase 104 38 - 126 U/L   Total Bilirubin 1.1 0.3 - 1.2 mg/dL   GFR, Estimated 54 (L) >60 mL/min    Comment: (NOTE) Calculated using the CKD-EPI Creatinine Equation (2021)    Anion gap 6 5 - 15    Comment: Performed at [facility], [address] [place], [place] [address]  Magnesium     Status: None   Collection Time: 04/15/22  4:02 AM  Result Value Ref Range   Magnesium 2.1 1.7 - 2.4 mg/dL    Comment: Performed at [facility], [address], [place], [place] [address]  Phosphorus      Status: None   Collection Time: 04/15/22  4:02 AM  Result Value Ref Range   Phosphorus 2.6 2.5 - 4.6 mg/dL    Comment: Performed at [facility], [address] [place], [place] [address]  C-reactive protein     Status: Abnormal   Collection Time: 04/15/22  4:02 AM  Result Value Ref Range   CRP 5.7 (H) <1.0 mg/dL    Comment: Performed at [facility], [address] [place], [place] [address]  CK     Status: None   Collection Time: 04/15/22  4:02 AM  Result Value Ref Range   Total CK 68 49 - 397 U/L    Comment: Performed at [facility], [address] [place], [place] [address]  CBC     Status: None   Collection Time: 04/16/22  4:19 AM  Result Value Ref Range   WBC 5.7 4.0 - 10.5 K/uL   RBC 4.49 4.22 - 5.81 MIL/uL   Hemoglobin 14.7 13.0 - 17.0 g/dL   HCT 66.4 40.3 - 47.4 %   MCV 99.6 80.0 - 100.0 fL   MCH  32.7 26.0 - 34.0 pg   MCHC 32.9 30.0 - 36.0 g/dL   RDW 96.0 45.4 - 09.8 %   Platelets 170 150 - 400 K/uL   nRBC 0.0 0.0 - 0.2 %    Comment: Performed at [facility], [address] [place], [place] [address]  Comprehensive metabolic panel     Status: Abnormal   Collection Time: 04/16/22  4:19 AM  Result Value Ref Range   Sodium 135 135 - 145 mmol/L   Potassium 4.2 3.5 - 5.1 mmol/L   Chloride 101 98 - 111 mmol/L   CO2 24 22 - 32 mmol/L   Glucose, Bld 105 (H) 70 - 99 mg/dL    Comment: Glucose reference range applies only to samples taken after fasting for at least 8 hours.   BUN 16 8 - 23 mg/dL   Creatinine, Ser 7.82 (H) 0.61 - 1.24 mg/dL   Calcium 8.4 (L) 8.9 - 10.3 mg/dL   Total Protein 6.4 (L) 6.5 - 8.1 g/dL   Albumin 3.0 (L) 3.5 - 5.0 g/dL   AST 32 15 - 41 U/L   ALT 14 0 - 44 U/L   Alkaline Phosphatase 109 38 - 126 U/L   Total Bilirubin 1.4 (H) 0.3 - 1.2 mg/dL   GFR, Estimated 48 (L) >60 mL/min    Comment: (NOTE) Calculated using the CKD-EPI Creatinine Equation (2021)    Anion gap 10 5 - 15    Comment: Performed at  [facility], [address] [place], [place] [address]  Magnesium     Status: None   Collection Time: 04/16/22  4:19 AM  Result Value Ref Range   Magnesium 1.8 1.7 - 2.4 mg/dL    Comment: Performed at [facility], [address] [place], [place] [address]  Phosphorus     Status: None   Collection Time: 04/16/22  4:19 AM  Result Value Ref Range   Phosphorus 3.4 2.5 - 4.6 mg/dL    Comment: Performed at [facility], [address] [place], [place] [address]  C-reactive protein     Status: Abnormal   Collection Time: 04/16/22  4:19 AM  Result Value Ref Range   CRP 7.2 (H) <1.0 mg/dL    Comment: Performed at [facility], [address] [place], [place] [address]  CK     Status: None   Collection Time: 04/16/22  4:19 AM  Result Value Ref Range   Total CK 53 49 - 397 U/L    Comment: Performed at [facility], [address] [place], [place] [address]  Basic metabolic panel     Status: Abnormal   Collection Time: 04/17/22  3:21 AM  Result Value Ref Range   Sodium 132 (L) 135 - 145 mmol/L   Potassium 3.7 3.5 - 5.1 mmol/L   Chloride 102 98 - 111 mmol/L   CO2 21 (L) 22 - 32 mmol/L   Glucose, Bld 107 (H) 70 - 99 mg/dL    Comment: Glucose reference range applies only to samples taken after fasting for at least 8 hours.   BUN 23 8 - 23 mg/dL   Creatinine, Ser 4.40 (H) 0.61 - 1.24 mg/dL   Calcium 8.2 (L) 8.9 - 10.3 mg/dL   GFR, Estimated 50 (L) >60 mL/min    Comment: (NOTE) Calculated using the CKD-EPI Creatinine Equation (2021)    Anion gap 9 5 - 15    Comment: Performed at [facility], [address] [place], [place] [address]  Magnesium     Status: None   Collection Time: 04/17/22  3:21 AM  Result Value Ref Range   Magnesium 2.3 1.7 - 2.4 mg/dL    Comment: Performed at [facility], [address] [place], [place] [address]  Phosphorus     Status: None   Collection Time: 04/17/22  3:21 AM  Result Value Ref  Range   Phosphorus 3.6 2.5 - 4.6 mg/dL    Comment: Performed at [facility], [address] [place], [place] [address]  Hemoglobin A1c     Status: Abnormal   Collection Time: 04/20/22 12:20 PM  Result Value Ref Range   Hgb A1c MFr Bld 6.0 (H) 4.8 - 5.6 %    Comment:          Prediabetes: 5.7 - 6.4          Diabetes: >6.4          Glycemic control for adults with diabetes: <7.0    Est. average glucose Bld gHb Est-mCnc 126 mg/dL  Lipid Panel w/o Chol/HDL Ratio     Status: Abnormal   Collection Time: 04/20/22 12:20 PM  Result Value Ref Range   Cholesterol, Total 117 100 - 199 mg/dL   Triglycerides 96 0 - 149 mg/dL   HDL 30 (L) >91 mg/dL   VLDL Cholesterol Cal 18 5 - 40 mg/dL   LDL Chol Calc (NIH) 69 0 - 99 mg/dL  Uric acid     Status: None   Collection Time: 04/20/22 12:20 PM  Result Value Ref Range   Uric Acid 4.5 3.8 - 8.4 mg/dL    Comment:            Therapeutic target for gout patients: <6.0    Radiology DG Humerus Left  Result Date: 05/05/2022 CLINICAL DATA:  Left arm pain after fall EXAM: LEFT HUMERUS - 2+ VIEW; LEFT CLAVICLE - 2+ VIEWS COMPARISON:  None Available. FINDINGS: No acute fracture or dislocation of the left humerus or clavicle. Advanced hypertrophic degenerative arthritis left shoulder. Postoperative changes left shoulder. Age indeterminate fractures of the left 5th-6th ribs. IMPRESSION: No acute fracture of the left humerus or clavicle. Age-indeterminate fractures of the left fifth and sixth ribs. Recommend correlation with site of pain. Advanced arthritis left shoulder. Electronically Signed   By: Minerva Fester M.D.   On: 05/05/2022 21:56   DG Clavicle Left  Result Date: 05/05/2022 CLINICAL DATA:  Left arm pain after fall EXAM: LEFT HUMERUS - 2+ VIEW; LEFT CLAVICLE - 2+ VIEWS COMPARISON:  None Available. FINDINGS: No acute fracture or dislocation of the left humerus or clavicle. Advanced hypertrophic degenerative arthritis left shoulder. Postoperative  changes left shoulder. Age indeterminate fractures of the left 5th-6th ribs. IMPRESSION: No acute fracture of the left humerus or clavicle. Age-indeterminate fractures of the left fifth and sixth ribs. Recommend correlation with site of pain. Advanced arthritis left shoulder. Electronically Signed   By: Minerva Fester M.D.   On: 05/05/2022 21:56   CT HEAD WO CONTRAST ( )  Result Date: 05/05/2022 CLINICAL DATA:  Patient fell from standing today. On blood thinners. EXAM: CT HEAD WITHOUT CONTRAST CT CERVICAL SPINE WITHOUT CONTRAST TECHNIQUE: Multidetector CT imaging of the head and cervical spine was performed following the standard protocol without intravenous contrast. Multiplanar CT image reconstructions of the cervical spine were also generated. RADIATION DOSE REDUCTION: This exam was performed according to the departmental dose-optimization program which includes automated exposure control, adjustment of the mA  and/or kV according to patient size and/or use of iterative reconstruction technique. COMPARISON:  Brain MRI 04/11/2022. FINDINGS: CT HEAD FINDINGS Brain: There is no evidence for acute hemorrhage, hydrocephalus, mass lesion, or abnormal extra-axial fluid collection. No definite CT evidence for acute infarction. Diffuse loss of parenchymal volume is consistent with atrophy. Patchy low attenuation in the deep hemispheric and periventricular white matter is nonspecific, but likely reflects chronic microvascular ischemic demyelination. Vascular: No hyperdense vessel or unexpected calcification. Skull: No evidence for fracture. No worrisome lytic or sclerotic lesion. Sinuses/Orbits: The visualized paranasal sinuses and mastoid air cells are clear. Visualized portions of the globes and intraorbital fat are unremarkable. Other: None. CT CERVICAL SPINE FINDINGS Alignment: Mild straightening of upper cervical lordosis. No findings to suggest traumatic subluxation. Skull base and vertebrae: No acute fracture.  No primary bone lesion or focal pathologic process. Soft tissues and spinal canal: No prevertebral fluid or swelling. No visible canal hematoma. Disc levels: Mild loss of disc height noted C3-4 C4-5 and C5-6. Mild facet osteoarthritis noted upper and mid cervical levels. Upper chest: Bilateral pleural effusions evident. Other: None. IMPRESSION: 1. No acute intracranial abnormality. 2. Atrophy with chronic small vessel ischemic disease. 3. No evidence for cervical spine fracture or traumatic subluxation. 4. Bilateral pleural effusions. Consider chest x-ray to better evaluate. Electronically Signed   By: Kennith Center M.D.   On: 05/05/2022 21:49   CT Cervical Spine Wo Contrast  Result Date: 05/05/2022 CLINICAL DATA:  Patient fell from standing today. On blood thinners. EXAM: CT HEAD WITHOUT CONTRAST CT CERVICAL SPINE WITHOUT CONTRAST TECHNIQUE: Multidetector CT imaging of the head and cervical spine was performed following the standard protocol without intravenous contrast. Multiplanar CT image reconstructions of the cervical spine were also generated. RADIATION DOSE REDUCTION: This exam was performed according to the departmental dose-optimization program which includes automated exposure control, adjustment of the mA and/or kV according to patient size and/or use of iterative reconstruction technique. COMPARISON:  Brain MRI 04/11/2022. FINDINGS: CT HEAD FINDINGS Brain: There is no evidence for acute hemorrhage, hydrocephalus, mass lesion, or abnormal extra-axial fluid collection. No definite CT evidence for acute infarction. Diffuse loss of parenchymal volume is consistent with atrophy. Patchy low attenuation in the deep hemispheric and periventricular white matter is nonspecific, but likely reflects chronic microvascular ischemic demyelination. Vascular: No hyperdense vessel or unexpected calcification. Skull: No evidence for fracture. No worrisome lytic or sclerotic lesion. Sinuses/Orbits: The visualized  paranasal sinuses and mastoid air cells are clear. Visualized portions of the globes and intraorbital fat are unremarkable. Other: None. CT CERVICAL SPINE FINDINGS Alignment: Mild straightening of upper cervical lordosis. No findings to suggest traumatic subluxation. Skull base and vertebrae: No acute fracture. No primary bone lesion or focal pathologic process. Soft tissues and spinal canal: No prevertebral fluid or swelling. No visible canal hematoma. Disc levels: Mild loss of disc height noted C3-4 C4-5 and C5-6. Mild facet osteoarthritis noted upper and mid cervical levels. Upper chest: Bilateral pleural effusions evident. Other: None. IMPRESSION: 1. No acute intracranial abnormality. 2. Atrophy with chronic small vessel ischemic disease. 3. No evidence for cervical spine fracture or traumatic subluxation. 4. Bilateral pleural effusions. Consider chest x-ray to better evaluate. Electronically Signed   By: Kennith Center M.D.   On: 05/05/2022 21:49   MR CARDIAC MORPHOLOGY W WO CONTRAST  Result Date: 04/14/2022 CLINICAL DATA:  LA mass, cardiomyopathy EXAM: CARDIAC MRI TECHNIQUE: The patient was scanned on a 1.5 Tesla Siemens magnet. A dedicated cardiac coil was used. Functional imaging  was done using Fiesta sequences. 2,3, and 4 chamber views were done to assess for RWMA's. Modified Simpson's rule using a short axis stack was used to calculate an ejection fraction on a dedicated work [employer]. The patient received 11 cc of Gadavist. After 10 minutes inversion recovery sequences were used to assess for infiltration and scar tissue. Velocity flow mapping performed in the ascending aorta and main pulmonary artery. CONTRAST:  11 cc  of Gadavist FINDINGS: 1. Moderately dilated LV size, severely reduced systolic function (LVEF = 18%). Normal LV wall thickness. There is global hypokinesis with no regional wall motion abnormalities. There is no late gadolinium enhancement in the left ventricular  myocardium. Mildly elevated LV native T1 value of 1046 ms (normal <1000) LV ECV 32% (normal <30%) LVEDV: 291 ml LVESV: 240 ml SV: 51 ml CO: 4 L/min Myocardial mass: 150 g 2. Normal right ventricular size and thickness. Mildly reduced RV systolic function (RVEF = 39%). 3. Severely dilated left atrium, mildly dilated right atrium. There is a 31 x 22 mm mass in the superior and posterior aspect of the left atrium most consistent with a thrombus. 4. Normal size of the aortic root, ascending aorta and pulmonary artery. 5.  Mild MR, mild AI. 6.  Normal pericardium.  No pericardial effusion. IMPRESSION: 1. Moderately dilated LV, severely reduced LV systolic function. LVEF 18%. 2. There is a 31 x 22 mm mass in the superior and posterior aspect of the left atrium most consistent with a thrombus. 3. There is no late gadolinium enhancement in the left ventricular myocardium. 4. There is no evidence for myocardial infiltration. 5. Mildly reduced RV function. 6. Findings consistent with dilated nonischemic cardiomyopathy. Electronically Signed   By: Debbe Odea M.D.   On: 04/14/2022 17:53   MR CARDIAC VELOCITY FLOW MAP  Result Date: 04/14/2022 CLINICAL DATA:  LA mass, cardiomyopathy EXAM: CARDIAC MRI TECHNIQUE: The patient was scanned on a 1.5 Tesla Siemens magnet. A dedicated cardiac coil was used. Functional imaging was done using Fiesta sequences. 2,3, and 4 chamber views were done to assess for RWMA's. Modified Simpson's rule using a short axis stack was used to calculate an ejection fraction on a dedicated work [employer], [employer]. The patient received 11 cc of Gadavist. After 10 minutes inversion recovery sequences were used to assess for infiltration and scar tissue. Velocity flow mapping performed in the ascending aorta and main pulmonary artery. CONTRAST:  11 cc  of Gadavist FINDINGS: 1. Moderately dilated LV size, severely reduced systolic function (LVEF = 18%). Normal LV wall thickness. There is  global hypokinesis with no regional wall motion abnormalities. There is no late gadolinium enhancement in the left ventricular myocardium. Mildly elevated LV native T1 value of 1046 ms (normal <1000) LV ECV 32% (normal <30%) LVEDV: 291 ml LVESV: 240 ml SV: 51 ml CO: 4 L/min Myocardial mass: 150 g 2. Normal right ventricular size and thickness. Mildly reduced RV systolic function (RVEF = 39%). 3. Severely dilated left atrium, mildly dilated right atrium. There is a 31 x 22 mm mass in the superior and posterior aspect of the left atrium most consistent with a thrombus. 4. Normal size of the aortic root, ascending aorta and pulmonary artery. 5.  Mild MR, mild AI. 6.  Normal pericardium.  No pericardial effusion. IMPRESSION: 1. Moderately dilated LV, severely reduced LV systolic function. LVEF 18%. 2. There is a 31 x 22 mm mass in the superior and posterior aspect of the left atrium  most consistent with a thrombus. 3. There is no late gadolinium enhancement in the left ventricular myocardium. 4. There is no evidence for myocardial infiltration. 5. Mildly reduced RV function. 6. Findings consistent with dilated nonischemic cardiomyopathy. Electronically Signed   By: Debbe Odea M.D.   On: 04/14/2022 17:53   PERIPHERAL VASCULAR CATHETERIZATION  Result Date: 04/13/2022 See surgical note for result.  PERIPHERAL VASCULAR CATHETERIZATION  Result Date: 04/12/2022 See surgical note for result.  ECHOCARDIOGRAM COMPLETE  Result Date: 04/12/2022    ECHOCARDIOGRAM REPORT   Patient Name:   KAMAURI DENARDO Date of Exam: 04/12/2022 Medical Rec #:  [id number]      Height:       71.0 in Accession #:    [id number]     Weight:       174.2 lb Date of Birth:  12-22-1940      BSA:          1.988 m Patient Age:    81 years       BP:           130/88 mmHg Patient Gender: M              HR:           79 bpm. Exam Location:  [facility] Procedure: 2D Echo, Cardiac Doppler, Color Doppler and Intracardiac            Opacification Agent  Indications:     Thrombus in heart chamber  History:         Patient has no prior history of Echocardiogram examinations.                  Arrythmias:Atrial Fibrillation; Risk Factors:Hypertension,                  Diabetes and Dyslipidemia. Pulmonary embolus.  Sonographer:     Mikki Harbor Referring Phys:  [id number] Eliezer Mccoy PATEL Diagnosing Phys: Debbe Odea MD IMPRESSIONS  1. Left ventricular ejection fraction, by estimation, is <20%. The left ventricle has severely decreased function. The left ventricle demonstrates global hypokinesis. The left ventricular internal cavity size was mildly dilated. Left ventricular diastolic parameters are indeterminate.  2. Right ventricular systolic function is low normal. The right ventricular size is normal. There is moderately elevated pulmonary artery systolic pressure. The estimated right ventricular systolic pressure is 46.6 mmHg.  3. There is a 3 x 2.5 cm mass attached to the superior-posterior wall of the left atrium. Differential includes tumor (myxoma) vs thrombus- less likely. consider CMR for better characterization.. Left atrial size was mildly dilated.  4. Right atrial size was moderately dilated.  5. The mitral valve is normal in structure. Mild mitral valve regurgitation.  6. The aortic valve is tricuspid. Aortic valve regurgitation is mild. Aortic valve sclerosis/calcification is present, without any evidence of aortic stenosis.  7. The inferior vena cava is dilated in size with <50% respiratory variability, suggesting right atrial pressure of 15 mmHg. FINDINGS  Left Ventricle: Left ventricular ejection fraction, by estimation, is <20%. The left ventricle has severely decreased function. The left ventricle demonstrates global hypokinesis. Definity contrast agent was given IV to delineate the left ventricular endocardial borders. The left ventricular internal cavity size was mildly dilated. There is no left ventricular hypertrophy. Left ventricular diastolic  parameters are indeterminate. Right Ventricle: The right ventricular size is normal. No increase in right ventricular wall thickness. Right ventricular systolic function is low normal. There is moderately elevated pulmonary artery systolic pressure. The tricuspid  regurgitant velocity  is 2.81 m/s, and with an assumed right atrial pressure of 15 mmHg, the estimated right ventricular systolic pressure is 46.6 mmHg. Left Atrium: There is a 3 x 2.5 cm mass attached to the superior-posterior wall of the left atrium. Differential includes tumor (myxoma) vs thrombus- less likely. consider CMR for better characterization. Left atrial size was mildly dilated. Right Atrium: Right atrial size was moderately dilated. Pericardium: There is no evidence of pericardial effusion. Mitral Valve: The mitral valve is normal in structure. Mild mitral valve regurgitation. MV peak gradient, 6.8 mmHg. The mean mitral valve gradient is 3.0 mmHg. Tricuspid Valve: The tricuspid valve is normal in structure. Tricuspid valve regurgitation is mild. Aortic Valve: The aortic valve is tricuspid. Aortic valve regurgitation is mild. Aortic regurgitation PHT measures 552 msec. Aortic valve sclerosis/calcification is present, without any evidence of aortic stenosis. Aortic valve mean gradient measures 2.3  mmHg. Aortic valve peak gradient measures 4.1 mmHg. Aortic valve area, by VTI measures 2.49 cm. Pulmonic Valve: The pulmonic valve was normal in structure. Pulmonic valve regurgitation is mild. Aorta: The aortic root is normal in size and structure. Venous: The inferior vena cava is dilated in size with less than 50% respiratory variability, suggesting right atrial pressure of 15 mmHg. IAS/Shunts: No atrial level shunt detected by color flow Doppler.  LEFT VENTRICLE PLAX 2D LVIDd:         5.90 cm LVIDs:         5.50 cm LV PW:         1.00 cm LV IVS:        0.90 cm LVOT diam:     1.90 cm LV SV:         48 LV SV Index:   24 LVOT Area:     2.84 cm  LV  Volumes (MOD) LV vol d, MOD A2C: 196.0 ml LV vol d, MOD A4C: 148.0 ml LV vol s, MOD A2C: 144.0 ml LV vol s, MOD A4C: 120.0 ml LV SV MOD A2C:     52.0 ml LV SV MOD A4C:     148.0 ml LV SV MOD BP:      43.0 ml RIGHT VENTRICLE RV Basal diam:  3.80 cm RV Mid diam:    2.40 cm RV S prime:     16.50 cm/s TAPSE (M-mode): 1.7 cm LEFT ATRIUM              Index        RIGHT ATRIUM           Index LA diam:        5.00 cm  2.51 cm/m   RA Area:     26.70 cm LA Vol (A2C):   179.0 ml 90.02 ml/m  RA Volume:   88.20 ml  44.36 ml/m LA Vol (A4C):   66.3 ml  33.34 ml/m LA Biplane Vol: 119.0 ml 59.85 ml/m  AORTIC VALVE                    PULMONIC VALVE AV Area (Vmax):    2.36 cm     PV Vmax:       0.70 m/s AV Area (Vmean):   2.27 cm     PV Peak grad:  2.0 mmHg AV Area (VTI):     2.49 cm AV Vmax:           101.73 cm/s AV Vmean:          68.867 cm/s AV VTI:  0.191 m AV Peak Grad:      4.1 mmHg AV Mean Grad:      2.3 mmHg LVOT Vmax:         84.75 cm/s LVOT Vmean:        55.100 cm/s LVOT VTI:          0.168 m LVOT/AV VTI ratio: 0.88 AI PHT:            552 msec  AORTA Ao Root diam: 3.50 cm Ao Asc diam:  3.70 cm MITRAL VALVE               TRICUSPID VALVE MV Area (PHT): 4.46 cm    TR Peak grad:   31.6 mmHg MV Area VTI:   1.76 cm    TR Vmax:        281.00 cm/s MV Peak grad:  6.8 mmHg MV Mean grad:  3.0 mmHg    SHUNTS MV Vmax:       1.30 m/s    Systemic VTI:  0.17 m MV Vmean:      73.8 cm/s   Systemic Diam: 1.90 cm MV Decel Time: 170 msec MV E velocity: 82.70 cm/s Debbe Odea MD Electronically signed by Debbe Odea MD Signature Date/Time: 04/12/2022/12:10:29 PM    Final    CT ANGIO AO+BIFEM W & OR WO CONTRAST  Result Date: 04/12/2022 CLINICAL DATA:  absent pedal pulse / leg pain/ cold ext. Left leg numbness. EXAM: CT ANGIOGRAPHY OF ABDOMINAL AORTA WITH ILIOFEMORAL RUNOFF TECHNIQUE: Multidetector CT imaging of the abdomen, pelvis and lower extremities was performed using the standard protocol during bolus  administration of intravenous contrast. Multiplanar CT image reconstructions and MIPs were obtained to evaluate the vascular anatomy. RADIATION DOSE REDUCTION: This exam was performed according to the departmental dose-optimization program which includes automated exposure control, adjustment of the mA and/or kV according to patient size and/or use of iterative reconstruction technique. CONTRAST:  OMNIPAQUE IOHEXOL 350 MG/ML SOLN COMPARISON:  None Available. FINDINGS: VASCULAR Heart: There are irregular filling defects noted along the posterior wall of the left atrium concerning for clot. Cardiomegaly. Aorta: Aortic atherosclerosis.  No aneurysm or dissection. Celiac: Patent without evidence of aneurysm, dissection, vasculitis or significant stenosis. SMA: Patent without evidence of aneurysm, dissection, vasculitis or significant stenosis. Renals: Both renal arteries are patent without evidence of aneurysm, dissection, vasculitis, fibromuscular dysplasia or significant stenosis. IMA: Patent without evidence of aneurysm, dissection, vasculitis or significant stenosis. RIGHT Lower Extremity Inflow: Common, internal and external iliac arteries are patent without evidence of aneurysm, dissection, vasculitis or significant stenosis. Moderate atherosclerotic calcifications. Outflow: Common, superficial and profunda femoral arteries and the popliteal artery are patent without evidence of aneurysm, dissection, vasculitis or significant stenosis. Scattered calcifications. Runoff: Disease three-vessel runoff in the right calf with dominant vessel the posterior tibial artery. All 3 vessels appear patent to the ankle. LEFT Lower Extremity Inflow: Common, internal and external iliac arteries are patent without evidence of aneurysm, dissection, vasculitis or significant stenosis. Scattered calcifications. Outflow: Left common femoral artery is patent. 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 Superficial femoral artery and popliteal artery remain occluded along their entire course. Runoff: Reconstitution of the trifurcation vessels in the left calf. All 3 vessels demonstrate disease. The anterior tibial artery appears to occlude in the distal calf. Peroneal and dominant posterior tibial arteries are patent to the ankle. Veins: No obvious venous abnormality within the limitations  of this arterial phase study. Review of the MIP images confirms the above findings. NON-VASCULAR Lower chest: Small bilateral pleural effusions. Dependent bibasilar atelectasis. Hepatobiliary: Small layering gallstones within the gallbladder. No focal hepatic abnormality. Pancreas: No focal abnormality or ductal dilatation. Spleen: No focal abnormality.  Normal size. Adrenals/Urinary Tract: Diffuse enlargement of the left adrenal gland. Numerous bilateral renal cysts, the largest 4.3 cm in the right midpole. Moderate left hydronephrosis. Left ureter is dilated to the bladder. No obstructing stones visualized. No hydronephrosis on the right. Multiple nonobstructing left renal stones measuring up to 14 mm in the midpole. Urinary bladder grossly unremarkable. Stomach/Bowel: Normal appendix. Sigmoid diverticulosis. No active diverticulitis. Stomach and small bowel decompressed, unremarkable. Lymphatic: No adenopathy Reproductive: Prostate enlargement. Other: No free fluid or free air. Musculoskeletal: No acute bony abnormality. IMPRESSION: VASCULAR Filling defect along the posterior wall of the left atrium concerning for clot. This could be further evaluated with echo. Cardiomegaly. Atherosclerotic irregularity throughout the aorta and iliac vessels. 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 Reconstitution of the trifurcation vessels below the left knee with dominant runoff via  a diseased posterior tibial artery. Mild to moderate disease throughout the right leg inflow and outflow vessels. Three-vessel runoff to the ankle with dominant flow via the posterior tibial artery. NON-VASCULAR Small bilateral pleural effusions. Cholelithiasis. Left hydronephrosis. Left ureter is dilated to the bladder. No visible obstructing stones. Prostate enlargement. Electronically Signed   By: Charlett Nose M.D.   On: 04/12/2022 02:34   MR BRAIN WO CONTRAST  Result Date: 04/12/2022 CLINICAL DATA:  Initial evaluation for syncope/presyncope. EXAM: MRI HEAD WITHOUT CONTRAST TECHNIQUE: Multiplanar, multiecho pulse sequences of the brain and surrounding structures were obtained without intravenous contrast. COMPARISON:  None Available. FINDINGS: Brain: Diffuse prominence of the CSF containing spaces compatible generalized age-related cerebral atrophy. Patchy and confluent T2/FLAIR hyperintensity involving the periventricular deep white matter both cerebral hemispheres as well as the pons, consistent with chronic small vessel ischemic disease, moderately advanced. No evidence for acute or subacute ischemia. Gray-white matter differentiation maintained. No areas of chronic cortical infarction. No acute or chronic intracranial blood products. No mass lesion, midline shift or mass effect. No hydrocephalus or extra-axial fluid collection. Pituitary gland and suprasellar region within normal limits. Vascular: Major intracranial vascular flow voids are maintained. Skull and upper cervical spine: Craniocervical junction within normal limits. Bone marrow signal intensity normal. No scalp soft tissue abnormality. Sinuses/Orbits: Prior ocular lens replacement on the right. Globes and orbital soft tissues demonstrate no acute finding. Paranasal sinuses are clear. Trace bilateral mastoid effusions noted, of doubtful significance. Other: None. IMPRESSION: 1. No acute intracranial abnormality. 2. Age-related cerebral atrophy  with moderately advanced chronic microvascular ischemic disease. Electronically Signed   By: Rise Mu M.D.   On: 04/12/2022 00:39   DG Ankle 2 Views Left  Result Date: 04/11/2022 CLINICAL DATA:  Multiple recent falls. EXAM: LEFT ANKLE - 2 VIEW COMPARISON:  None Available. FINDINGS: There is no evidence of an acute fracture or dislocation. A chronic deformity is seen involving the medial aspect of the left navicular bone, with chronic changes also noted along the left lateral malleolus and left medial malleolus. Mild degenerative changes are seen involving the left ankle, with moderate severity degenerative changes noted along the dorsal aspect of the proximal to mid left foot. Soft tissues are unremarkable. IMPRESSION: 1. No acute osseous abnormality. 2. Chronic and degenerative changes, as described above. Electronically Signed  By: Aram Candela M.D.   On: 04/11/2022 20:15   DG Foot 2 Views Left  Result Date: 04/11/2022 CLINICAL DATA:  Pain after falls EXAM: LEFT FOOT - 2 VIEW COMPARISON:  None Available. FINDINGS: Calcaneal spurs. Mild dorsal spurring in the midfoot. No fracture or dislocation. Normal alignment. Normal mineralization. No radiodense foreign body. No subcutaneous gas. IMPRESSION: Degenerative changes as above. No acute findings. Electronically Signed   By: Corlis Leak M.D.   On: 04/11/2022 20:07   DG Hip Unilat W or Wo Pelvis 2-3 Views Left  Result Date: 04/11/2022 CLINICAL DATA:  Fall, pain. EXAM: DG HIP (WITH OR WITHOUT PELVIS) 2-3V LEFT COMPARISON:  None Available. FINDINGS: There is no evidence of hip fracture or dislocation. There is no evidence of arthropathy or other focal bone abnormality. Vascular calcifications. IMPRESSION: No acute bony abnormality. Electronically Signed   By: Charlett Nose M.D.   On: 04/11/2022 20:06    Assessment/Plan  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 with rest pain [facility]) The patient underwent extensive  revascularization of the left lower extremity for ischemic rest pain with marked improvement.  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  The reperfusion swelling as expected.  Compression socks and elevation.  He was recommended to walk is much as possible.  Continue current medications including anticoagulation.  Recheck in 2 to 3 months with noninvasive studies.  Hypertension blood pressure control important in reducing the progression of atherosclerotic disease. On appropriate oral medications.   Type 2 diabetes mellitus with stage 3b chronic kidney disease, without long-term current use of insulin (HCC) blood glucose control important in reducing the progression of atherosclerotic disease. Also, involved in wound healing. On appropriate medications.   Hyperlipidemia lipid control important in reducing the progression of atherosclerotic disease. Continue statin therapy    Festus Barren, MD  05/09/2022 4:06 PM    This note was created with Dragon medical transcription system.  Any errors from dictation are purely unintentional

## 2022-05-10 ENCOUNTER — Telehealth: Payer: Self-pay | Admitting: Internal Medicine

## 2022-05-10 NOTE — Telephone Encounter (Signed)
Curtis Wagner with [facility] wanting to continue services for wound care on patient's skin tear that recently happened. I gave a verbal to continue the home health for wound care now.

## 2022-05-16 ENCOUNTER — Encounter: Payer: Self-pay | Admitting: Medical

## 2022-05-16 ENCOUNTER — Other Ambulatory Visit
Admission: RE | Admit: 2022-05-16 | Discharge: 2022-05-16 | Disposition: A | Discharge status: Home / Self Care | Payer: Medicare PPO | Source: Ambulatory Visit | Attending: Medical | Admitting: Medical

## 2022-05-16 ENCOUNTER — Ambulatory Visit: Payer: Medicare PPO | Attending: Medical | Admitting: Medical

## 2022-05-16 VITALS — BP 120/82 | HR 88 | Ht 71.5 in | Wt 182.4 lb

## 2022-05-16 DIAGNOSIS — I2510 Atherosclerotic heart disease of native coronary artery without angina pectoris: Secondary | ICD-10-CM | POA: Diagnosis not present

## 2022-05-16 DIAGNOSIS — I480 Paroxysmal atrial fibrillation: Secondary | ICD-10-CM | POA: Insufficient documentation

## 2022-05-16 DIAGNOSIS — I10 Essential (primary) hypertension: Secondary | ICD-10-CM

## 2022-05-16 DIAGNOSIS — I4729 Other ventricular tachycardia: Secondary | ICD-10-CM

## 2022-05-16 DIAGNOSIS — I513 Intracardiac thrombosis, not elsewhere classified: Secondary | ICD-10-CM

## 2022-05-16 DIAGNOSIS — I4821 Permanent atrial fibrillation: Secondary | ICD-10-CM

## 2022-05-16 DIAGNOSIS — I5022 Chronic systolic (congestive) heart failure: Secondary | ICD-10-CM

## 2022-05-16 LAB — CBC
HCT: 41.2 % (ref 39.0–52.0)
Hemoglobin: 13.2 g/dL (ref 13.0–17.0)
MCH: 32.7 pg (ref 26.0–34.0)
MCHC: 32 g/dL (ref 30.0–36.0)
MCV: 102 fL — ABNORMAL HIGH (ref 80.0–100.0)
Platelets: 158 10*3/uL (ref 150–400)
RBC: 4.04 MIL/uL — ABNORMAL LOW (ref 4.22–5.81)
RDW: 14.8 % (ref 11.5–15.5)
WBC: 4.4 10*3/uL (ref 4.0–10.5)
nRBC: 0 % (ref 0.0–0.2)

## 2022-05-16 MED ORDER — SPIRONOLACTONE 25 MG PO TABS: 12.5000 mg | ORAL_TABLET | Freq: Every day | ORAL | 3 refills | Status: DC

## 2022-05-16 NOTE — Patient Instructions (Signed)
Medication Instructions:  START Spironolactone 12.5 mg daily   *If you need a refill on your cardiac medications before your next appointment, please call your pharmacy*   Lab Work: CBC today  BMET in 1 week   If you have labs (blood work) drawn today and your tests are completely normal, you will receive your results only by: MyChart Message (if you have MyChart) OR A paper copy in the mail If you have any lab test that is abnormal or we need to change your treatment, we will call you to review the results.   Follow-Up: At [facility], you and your health needs are our priority.  As part of our continuing mission to provide you with exceptional heart care, we have created designated Provider Care Teams.  These Care Teams include your primary Cardiologist (physician) and Advanced Practice Providers (APPs -  Physician Assistants and Nurse Practitioners) who all work together to provide you with the care you need, when you need it.  We recommend signing up for the patient portal called "MyChart".  Sign up information is provided on this After Visit Summary.  MyChart is used to connect with patients for Virtual Visits (Telemedicine).  Patients are able to view lab/test results, encounter notes, upcoming appointments, etc.  Non-urgent messages can be sent to your provider as well.   To learn more about what you can do with MyChart, go to [email or web address].    Your next appointment:   2 week(s)  Provider:   Terrilee Croak, PA-C

## 2022-05-16 NOTE — Progress Notes (Unsigned)
Cardiology Office Note:    Date:  05/18/2022   ID:  Curtis Wagner, DOB 1940/08/14, MRN [id number]  PCP:  Margaretann Loveless, MD  [facility] Cardiologist:  None  [facility] Electrophysiologist:  None   Referring MD: No ref. provider found   Chief Complaint: Hospital follow-up  History of Present Illness:    Curtis Wagner is a 82 y.o. male with a hx of CAD with Impella supported PCI/DES to the left main in 2022, right atrial thrombus, persistent A-fib on Xarelto, DVT with bilateral pulmonary embolism in the setting of noncompliance with anticoagulation, NSVT, hypertension, hyperlipidemia, PAD who is being seen for hospital follow-up.  Patient underwent echo in 2019 that showed EF of 55 to 60%, borderline concentric LVH moderately dilated left atrium, mildly dilated right atrium, mildly dilated aortic root and ascending aorta.  He was admitted to outside hospital in July 2022 with unstable angina.  Echo showed an EF of 15 to 20% with global hypokinesis, normal RV size with mildly reduced systolic function, mild to moderate MR, mild aortic insufficiency, severe left and mild right atrial dilation.  Left heart cath showed multivessel CAD including left main coronary artery stenosis.  He was deemed too high risk for surgery and underwent Impella supported PCI/DES to the left main on August 25, 2020.  He was noted to have NSVT and PVCs on telemetry and started on amiodarone.  He declined a LifeVest.  He was discharged to hospice, though graduated.  Was admitted in March 2023 with large bilateral pulmonary emboli in the setting of missed anticoagulation.  Echo during that admission showed an EF of approximately 20%, large multilevel mobile thrombus in the right atrial cavity, moderately dilated left atrium, mildly reduced RV systolic function, and mild MR.  Patient was admitted in March 2020 for with left lower extremity pain, numbness, weakness. CTA of the abdominal aorta with iliofemoral runoff  demonstrated a filling defect along the posterior wall of the left atrium concerning for thrombus, complete occlusion of the left SFA and left profundofemoral artery just beyond the common femoral bifurcation as well as mild to moderate disease throughout the right lower extremity, small bilateral pleural effusions, cholelithiasis, left hydronephrosis with left ureter noted to be dilated, and prostate enlargement.  High-sensitivity troponin 20, 22.  He was given aspirin and started on heparin drip.  Patient was seen by vascular and underwent intervention over several days with lysis, thrombectomy, stenting of the proximal left SFA left distal SFA and popliteal artery.  Echo showed LVEF less than 20%, and left atrial thrombus.  Xarelto was switched to Eliquis.  Today, the patient reports he is doing OK. He has some pain in the 5th and 6th rib due to a fall. This occurred after the hospitalization. He hit his head and imaging did not show a brain bleed. He denies chest pain. He reports DOE that is stable. He uses a cane sometimes for balance issues. He has chronic lower leg edema. Doesn't eat a lot of salt. He does elevate his feet. He lives by himself. He denies left leg pain.  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, has arithritis in numerous areas of body   DVT (deep venous thrombosis)    History of kidney stones    Hyperlipidemia    Hypertension    PAF (paroxysmal atrial fibrillation)     Past Surgical History:  Procedure Laterality Date   EYE SURGERY Right    /w  IOL   LOWER EXTREMITY ANGIOGRAPHY Left 04/12/2022   Procedure: Lower Extremity Angiography;  Surgeon: Annice Needy, MD;  Location: [facility] INVASIVE CV LAB;  Service: Cardiovascular;  Laterality: Left;   LOWER EXTREMITY INTERVENTION Left 04/13/2022   Procedure: LOWER EXTREMITY INTERVENTION;  Surgeon: Annice Needy, MD;  Location: [facility] INVASIVE CV LAB;  Service: Cardiovascular;  Laterality: Left;   SHOULDER  SURGERY Left 1961   TONSILLECTOMY     TOTAL SHOULDER ARTHROPLASTY Right 01/01/2015   Procedure: RIGHT TOTAL SHOULDER ARTHROPLASTY;  Surgeon: Loreta Ave, MD;  Location: [facility];  Service: Orthopedics;  Laterality: Right;    Current Medications: Current Meds  Medication Sig   allopurinol (ZYLOPRIM) 300 MG tablet Take 1 tablet (300 mg total) by mouth daily. Last dispense not on file   amiodarone (PACERONE) 200 MG tablet Take 1 tablet (200 mg total) by mouth 2 (two) times daily.   atorvastatin (LIPITOR) 80 MG tablet Take 80 mg by mouth daily. Last dispense 03/2022 30DS   escitalopram (LEXAPRO) 20 MG tablet Take 1 tablet (20 mg total) by mouth daily.   JARDIANCE 10 MG TABS tablet Take 1 tablet (10 mg total) by mouth every morning. Last dispense 02/2022 30DS   metoprolol succinate (TOPROL-XL) 25 MG 24 hr tablet Take 1 tablet (25 mg total) by mouth daily. Take with or immediately following a meal.   Multiple Vitamins-Minerals (CENTRUM SILVER ADULT 50+) TABS Take 1 tablet by mouth daily.   nitroGLYCERIN (NITROSTAT) 0.4 MG SL tablet Place 0.4 mg under the tongue every 5 (five) minutes as needed for chest pain.   potassium chloride SA (KLOR-CON M) 20 MEQ tablet Take 2 tablets (40 mEq total) by mouth daily. Last dispense 12/2021 30DS   spironolactone (ALDACTONE) 25 MG tablet Take 0.5 tablets (12.5 mg total) by mouth daily.   tamsulosin (FLOMAX) 0.4 MG CAPS capsule Take 0.4 mg by mouth at bedtime. Last dispense 03/2022 30DS   [DISCONTINUED] apixaban (ELIQUIS) 5 MG TABS tablet Take 1 tablet (5 mg total) by mouth 2 (two) times daily.   [DISCONTINUED] clopidogrel (PLAVIX) 75 MG tablet Take 1 tablet (75 mg total) by mouth daily.     Allergies:   Morphine and Morphine and related   Social History   Socioeconomic History   Marital status: Divorced    Spouse name: Not on file   Number of children: Not on file   Years of education: Not on file   Highest education level: Not on file  Occupational  History   Not on file  Tobacco Use   Smoking status: Former   Smokeless tobacco: Former    Types: Associate Professor Use: Never used  Substance and Sexual Activity   Alcohol use: No    Alcohol/week: 0.0 standard drinks of alcohol   Drug use: No   Sexual activity: Not on file  Other Topics Concern   Not on file  Social History Narrative   Not on file   Social Determinants of Health   Financial Resource Strain: Not on file  Food Insecurity: No Food Insecurity (04/12/2022)   Hunger Vital Sign    Worried About Running Out of Food in the Last Year: Never true    Ran Out of Food in the Last Year: Never true  Transportation Needs: No Transportation Needs (04/12/2022)   PRAPARE - Administrator, Civil Service (Medical): No    Lack of Transportation (Non-Medical): No  Physical Activity: Not on file  Stress: Not on file  Social Connections: Not on file     Family History: The patient's family history includes Heart disease in his mother; Rheumatic fever in his mother; Stroke in his father and mother.  ROS:   Please see the history of present illness.     All other systems reviewed and are negative.  EKGs/Labs/Other Studies Reviewed:    The following studies were reviewed today:  Echo 03/2022  1. Left ventricular ejection fraction, by estimation, is <20%. The left  ventricle has severely decreased function. The left ventricle demonstrates  global hypokinesis. The left ventricular internal cavity size was mildly  dilated. Left ventricular  diastolic parameters are indeterminate.   2. Right ventricular systolic function is low normal. The right  ventricular size is normal. There is moderately elevated pulmonary artery  systolic pressure. The estimated right ventricular systolic pressure is  46.6 mmHg.   3. There is a 3 x 2.5 cm mass attached to the superior-posterior wall of  the left atrium. Differential includes tumor (myxoma) vs thrombus- less  likely.  consider CMR for better characterization.. Left atrial size was  mildly dilated.   4. Right atrial size was moderately dilated.   5. The mitral valve is normal in structure. Mild mitral valve  regurgitation.   6. The aortic valve is tricuspid. Aortic valve regurgitation is mild.  Aortic valve sclerosis/calcification is present, without any evidence of  aortic stenosis.   7. The inferior vena cava is dilated in size with <50% respiratory  variability, suggesting right atrial pressure of 15 mmHg.     EKG:  EKG is ordered today.  The ekg ordered today demonstrates Afib 88bpm, PVCs, nonspecific T wave changes  Recent Labs: 04/11/2022: TSH 2.204 04/16/2022: ALT 14 04/17/2022: BUN 23; Creatinine, Ser 1.39; Magnesium 2.3; Potassium 3.7; Sodium 132 05/16/2022: Hemoglobin 13.2; Platelets 158  Recent Lipid Panel    Component Value Date/Time   CHOL 117 04/20/2022 1220   TRIG 96 04/20/2022 1220   HDL 30 (L) 04/20/2022 1220   CHOLHDL 3.8 06/16/2016 1538   LDLCALC 69 04/20/2022 1220     Physical Exam:    VS:  BP 120/82 (BP Location: Left Arm, Patient Position: Sitting, Cuff Size: Normal)   Pulse 88   Ht 5' 11.5" (1.816 m)   Wt 182 lb 6.4 oz (82.7 kg)   SpO2 97%   BMI 25.09 kg/m     Wt Readings from Last 3 Encounters:  05/16/22 182 lb 6.4 oz (82.7 kg)  05/09/22 179 lb 3.2 oz (81.3 kg)  05/05/22 179 lb 0.2 oz (81.2 kg)     GEN:  Well nourished, well developed in no acute distress HEENT: Normal NECK: No JVD; No carotid bruits LYMPHATICS: No lymphadenopathy CARDIAC: RRR, no murmurs, rubs, gallops RESPIRATORY:  Clear to auscultation without rales, wheezing or rhonchi  ABDOMEN: Soft, non-tender, non-distended MUSCULOSKELETAL:  1-2+ lower leg edema; No deformity  SKIN: Warm and dry NEUROLOGIC:  Alert and oriented x 3 PSYCHIATRIC:  Normal affect   ASSESSMENT:    1. Permanent atrial fibrillation   2. Left atrial thrombus   3. Primary hypertension   4. Coronary artery disease  involving native coronary artery of native heart without angina pectoris   5. Chronic systolic heart failure   6. NSVT (nonsustained ventricular tachycardia)    PLAN:    In order of problems listed above:  Permanent Afib - he is in rate controlled afib today - CHADSVASC at least 5 - amiodarone for rate  control - no plans for cardioversion -Continue Eliquis 5 mg daily.  Patient is also on Plavix.  CBC today  Left atrial thrombus -Noted on echocardiogram, CT scan and cardiac MRI -Continue Eliquis -Plan to recheck an echo in 2 to 3 months  CAD -History of cAD, declined CABG, underwent Impella supported DES to left main that was 2 high risk for CT surgery - troponin was minimally elevated during hospitalization  -Previously referred to hospice but then improved -Continue Plavix and Lipitor -Patient is also on Eliquis  HFrEF -Echo during recent admission showed reduced EF less than 20%. -This was unchanged from prior echocardiograms in July 2022 in March 2023 -Cardiac cath in August 2022 at [facility] showed CAD. He declined CABG underwent successful left main IVUS guided DES with Impella support -He was discharged on Jardiance 10 mg daily and Toprol 25 mg daily -Patient has lower leg edema on exam, unsure if this is unchanged. - Not on lasix -Start spironolactone 25 mg daily, BMET in 1 week -Will see him back in 2 weeks and try and add losartan/Entresto.  Unsure if blood pressure will tolerate. -Plan to recheck an echo in 2 to 3 months  NSVT -Continue amiodarone 200 mg daily -Continue Toprol 25 mg daily  History of bilateral PEs and DVT -Prior history of noncompliance with anticoagulation -He was previously was treated with high-dose Xarelto in March 2023 - Now on Eliquis 5mg  BID  PAD - recent hospitalization with left ischemic leg s/p lysis, thrombectomy and stenting of the proximal left SFE, left distal SFA - being followed by [facility]   Disposition: Follow up in  2 week(s) with MD/APP     Signed, Jaime Dome David Stall, PA-C  05/18/2022 1:37 PM    [facility]

## 2022-05-18 ENCOUNTER — Telehealth: Payer: Self-pay | Admitting: Medical

## 2022-05-18 ENCOUNTER — Other Ambulatory Visit (INDEPENDENT_AMBULATORY_CARE_PROVIDER_SITE_OTHER): Payer: Self-pay

## 2022-05-18 ENCOUNTER — Telehealth (INDEPENDENT_AMBULATORY_CARE_PROVIDER_SITE_OTHER): Payer: Self-pay

## 2022-05-18 DIAGNOSIS — I4821 Permanent atrial fibrillation: Secondary | ICD-10-CM

## 2022-05-18 MED ORDER — CLOPIDOGREL BISULFATE 75 MG PO TABS: 75.0000 mg | ORAL_TABLET | Freq: Every day | ORAL | 2 refills | Status: DC

## 2022-05-18 MED ORDER — APIXABAN 5 MG PO TABS: 5.0000 mg | ORAL_TABLET | Freq: Two times a day (BID) | ORAL | 2 refills | Status: DC

## 2022-05-18 MED ORDER — CLOPIDOGREL BISULFATE 75 MG PO TABS: 75.0000 mg | ORAL_TABLET | Freq: Every day | ORAL | 3 refills | Status: DC

## 2022-05-18 MED ORDER — APIXABAN 5 MG PO TABS: 5.0000 mg | ORAL_TABLET | Freq: Two times a day (BID) | ORAL | 0 refills | Status: DC

## 2022-05-18 NOTE — Telephone Encounter (Signed)
Returned call to patient's daughter Curtis Wagner (OK per DPR).  [name] states it is difficult for patient to travel from his home to the hospital, she is asking if BMET order can be done at [facility] as this is more convenient for patient.  BMET ordered and released to [facility].  [name] also states that at OV with Cadence Furth, PA-C on 05/16/22 they were asked if patient needed any refills and at the time they said no. However, now [name] states patient will need refills for Plavix and Eliquis. These were originally prescribed by a hospitalist that patient does not see.   [name] is asking if Cadence would be OK with refilling clopidogrel  QD and Apixabana  BID and send to [facility].  Will forward to Cadence Furth, PA-C to review and advise on refills.  [name] verbalized understanding and expressed appreciation for assistance.

## 2022-05-18 NOTE — Telephone Encounter (Signed)
Yes it is okay to refill both

## 2022-05-18 NOTE — Telephone Encounter (Signed)
Per Cadence Fransico Reed, PA-C: Yes, Ok to refill    Plavix refill sent to [facility].  Will forward to Pharm D to refill Eliquis.

## 2022-05-18 NOTE — Telephone Encounter (Signed)
Pt's daughter would like a callback regarding pt labs. Please advise

## 2022-05-18 NOTE — Telephone Encounter (Signed)
Sent in refills and made pt's daughter aware.

## 2022-05-24 ENCOUNTER — Emergency Department: Payer: Medicare PPO

## 2022-05-24 ENCOUNTER — Other Ambulatory Visit: Payer: Self-pay

## 2022-05-24 ENCOUNTER — Inpatient Hospital Stay
Admission: EM | Admit: 2022-05-24 | Discharge: 2022-06-09 | DRG: 871 | Disposition: A | Discharge status: Hospice | Payer: Medicare PPO | Source: Skilled Nursing Facility | Attending: Internal Medicine | Admitting: Internal Medicine

## 2022-05-24 ENCOUNTER — Encounter: Payer: Self-pay | Admitting: Internal Medicine

## 2022-05-24 DIAGNOSIS — D6959 Other secondary thrombocytopenia: Secondary | ICD-10-CM | POA: Diagnosis present

## 2022-05-24 DIAGNOSIS — B955 Unspecified streptococcus as the cause of diseases classified elsewhere: Secondary | ICD-10-CM | POA: Diagnosis not present

## 2022-05-24 DIAGNOSIS — Z79899 Other long term (current) drug therapy: Secondary | ICD-10-CM

## 2022-05-24 DIAGNOSIS — I5A Non-ischemic myocardial injury (non-traumatic): Secondary | ICD-10-CM | POA: Diagnosis present

## 2022-05-24 DIAGNOSIS — I959 Hypotension, unspecified: Secondary | ICD-10-CM | POA: Diagnosis not present

## 2022-05-24 DIAGNOSIS — W19XXXA Unspecified fall, initial encounter: Secondary | ICD-10-CM | POA: Diagnosis present

## 2022-05-24 DIAGNOSIS — D696 Thrombocytopenia, unspecified: Secondary | ICD-10-CM | POA: Diagnosis not present

## 2022-05-24 DIAGNOSIS — D689 Coagulation defect, unspecified: Secondary | ICD-10-CM | POA: Diagnosis present

## 2022-05-24 DIAGNOSIS — I130 Hypertensive heart and chronic kidney disease with heart failure and stage 1 through stage 4 chronic kidney disease, or unspecified chronic kidney disease: Secondary | ICD-10-CM | POA: Diagnosis present

## 2022-05-24 DIAGNOSIS — E785 Hyperlipidemia, unspecified: Secondary | ICD-10-CM | POA: Diagnosis present

## 2022-05-24 DIAGNOSIS — Z7189 Other specified counseling: Secondary | ICD-10-CM | POA: Diagnosis not present

## 2022-05-24 DIAGNOSIS — B950 Streptococcus, group A, as the cause of diseases classified elsewhere: Secondary | ICD-10-CM | POA: Diagnosis not present

## 2022-05-24 DIAGNOSIS — N170 Acute kidney failure with tubular necrosis: Secondary | ICD-10-CM | POA: Diagnosis not present

## 2022-05-24 DIAGNOSIS — E860 Dehydration: Secondary | ICD-10-CM | POA: Diagnosis present

## 2022-05-24 DIAGNOSIS — Z515 Encounter for palliative care: Secondary | ICD-10-CM

## 2022-05-24 DIAGNOSIS — Z96611 Presence of right artificial shoulder joint: Secondary | ICD-10-CM | POA: Diagnosis present

## 2022-05-24 DIAGNOSIS — Z86711 Personal history of pulmonary embolism: Secondary | ICD-10-CM | POA: Diagnosis not present

## 2022-05-24 DIAGNOSIS — A419 Sepsis, unspecified organism: Secondary | ICD-10-CM | POA: Diagnosis present

## 2022-05-24 DIAGNOSIS — Z7902 Long term (current) use of antithrombotics/antiplatelets: Secondary | ICD-10-CM

## 2022-05-24 DIAGNOSIS — E440 Moderate protein-calorie malnutrition: Secondary | ICD-10-CM | POA: Insufficient documentation

## 2022-05-24 DIAGNOSIS — N1831 Chronic kidney disease, stage 3a: Secondary | ICD-10-CM | POA: Diagnosis not present

## 2022-05-24 DIAGNOSIS — N1832 Chronic kidney disease, stage 3b: Secondary | ICD-10-CM | POA: Diagnosis present

## 2022-05-24 DIAGNOSIS — R7401 Elevation of levels of liver transaminase levels: Secondary | ICD-10-CM | POA: Diagnosis not present

## 2022-05-24 DIAGNOSIS — I739 Peripheral vascular disease, unspecified: Secondary | ICD-10-CM | POA: Diagnosis not present

## 2022-05-24 DIAGNOSIS — R9431 Abnormal electrocardiogram [ECG] [EKG]: Secondary | ICD-10-CM | POA: Diagnosis present

## 2022-05-24 DIAGNOSIS — I743 Embolism and thrombosis of arteries of the lower extremities: Secondary | ICD-10-CM | POA: Diagnosis present

## 2022-05-24 DIAGNOSIS — A400 Sepsis due to streptococcus, group A: Secondary | ICD-10-CM | POA: Diagnosis present

## 2022-05-24 DIAGNOSIS — Y92009 Unspecified place in unspecified non-institutional (private) residence as the place of occurrence of the external cause: Secondary | ICD-10-CM

## 2022-05-24 DIAGNOSIS — E1122 Type 2 diabetes mellitus with diabetic chronic kidney disease: Secondary | ICD-10-CM | POA: Diagnosis present

## 2022-05-24 DIAGNOSIS — M7121 Synovial cyst of popliteal space [Baker], right knee: Secondary | ICD-10-CM | POA: Diagnosis present

## 2022-05-24 DIAGNOSIS — I4819 Other persistent atrial fibrillation: Secondary | ICD-10-CM | POA: Diagnosis present

## 2022-05-24 DIAGNOSIS — R7881 Bacteremia: Secondary | ICD-10-CM | POA: Diagnosis not present

## 2022-05-24 DIAGNOSIS — R6520 Severe sepsis without septic shock: Secondary | ICD-10-CM | POA: Diagnosis present

## 2022-05-24 DIAGNOSIS — G9340 Encephalopathy, unspecified: Secondary | ICD-10-CM | POA: Diagnosis not present

## 2022-05-24 DIAGNOSIS — I2489 Other forms of acute ischemic heart disease: Secondary | ICD-10-CM | POA: Diagnosis present

## 2022-05-24 DIAGNOSIS — G9341 Metabolic encephalopathy: Secondary | ICD-10-CM | POA: Diagnosis present

## 2022-05-24 DIAGNOSIS — Z87442 Personal history of urinary calculi: Secondary | ICD-10-CM

## 2022-05-24 DIAGNOSIS — E875 Hyperkalemia: Secondary | ICD-10-CM | POA: Diagnosis not present

## 2022-05-24 DIAGNOSIS — D631 Anemia in chronic kidney disease: Secondary | ICD-10-CM | POA: Diagnosis present

## 2022-05-24 DIAGNOSIS — N179 Acute kidney failure, unspecified: Secondary | ICD-10-CM | POA: Diagnosis present

## 2022-05-24 DIAGNOSIS — S0990XA Unspecified injury of head, initial encounter: Secondary | ICD-10-CM

## 2022-05-24 DIAGNOSIS — G8929 Other chronic pain: Secondary | ICD-10-CM | POA: Diagnosis not present

## 2022-05-24 DIAGNOSIS — I10 Essential (primary) hypertension: Secondary | ICD-10-CM | POA: Diagnosis not present

## 2022-05-24 DIAGNOSIS — I480 Paroxysmal atrial fibrillation: Secondary | ICD-10-CM | POA: Diagnosis not present

## 2022-05-24 DIAGNOSIS — Z8249 Family history of ischemic heart disease and other diseases of the circulatory system: Secondary | ICD-10-CM

## 2022-05-24 DIAGNOSIS — N132 Hydronephrosis with renal and ureteral calculous obstruction: Secondary | ICD-10-CM | POA: Diagnosis present

## 2022-05-24 DIAGNOSIS — Z6825 Body mass index (BMI) 25.0-25.9, adult: Secondary | ICD-10-CM

## 2022-05-24 DIAGNOSIS — E871 Hypo-osmolality and hyponatremia: Secondary | ICD-10-CM | POA: Diagnosis present

## 2022-05-24 DIAGNOSIS — Z7901 Long term (current) use of anticoagulants: Secondary | ICD-10-CM

## 2022-05-24 DIAGNOSIS — R296 Repeated falls: Secondary | ICD-10-CM | POA: Diagnosis not present

## 2022-05-24 DIAGNOSIS — M25512 Pain in left shoulder: Secondary | ICD-10-CM | POA: Diagnosis present

## 2022-05-24 DIAGNOSIS — E872 Acidosis, unspecified: Secondary | ICD-10-CM | POA: Diagnosis present

## 2022-05-24 DIAGNOSIS — Z7984 Long term (current) use of oral hypoglycemic drugs: Secondary | ICD-10-CM

## 2022-05-24 DIAGNOSIS — Z885 Allergy status to narcotic agent status: Secondary | ICD-10-CM

## 2022-05-24 DIAGNOSIS — E1151 Type 2 diabetes mellitus with diabetic peripheral angiopathy without gangrene: Secondary | ICD-10-CM | POA: Diagnosis present

## 2022-05-24 DIAGNOSIS — M109 Gout, unspecified: Secondary | ICD-10-CM | POA: Diagnosis present

## 2022-05-24 DIAGNOSIS — Z87891 Personal history of nicotine dependence: Secondary | ICD-10-CM

## 2022-05-24 DIAGNOSIS — I5022 Chronic systolic (congestive) heart failure: Secondary | ICD-10-CM | POA: Diagnosis present

## 2022-05-24 DIAGNOSIS — N183 Chronic kidney disease, stage 3 unspecified: Secondary | ICD-10-CM | POA: Diagnosis not present

## 2022-05-24 DIAGNOSIS — Z86718 Personal history of other venous thrombosis and embolism: Secondary | ICD-10-CM

## 2022-05-24 DIAGNOSIS — R7989 Other specified abnormal findings of blood chemistry: Secondary | ICD-10-CM | POA: Diagnosis present

## 2022-05-24 DIAGNOSIS — Z823 Family history of stroke: Secondary | ICD-10-CM

## 2022-05-24 DIAGNOSIS — Z66 Do not resuscitate: Secondary | ICD-10-CM | POA: Diagnosis present

## 2022-05-24 DIAGNOSIS — L03115 Cellulitis of right lower limb: Secondary | ICD-10-CM | POA: Diagnosis present

## 2022-05-24 DIAGNOSIS — I2510 Atherosclerotic heart disease of native coronary artery without angina pectoris: Secondary | ICD-10-CM | POA: Diagnosis present

## 2022-05-24 DIAGNOSIS — R197 Diarrhea, unspecified: Secondary | ICD-10-CM | POA: Diagnosis present

## 2022-05-24 DIAGNOSIS — N1330 Unspecified hydronephrosis: Secondary | ICD-10-CM | POA: Diagnosis not present

## 2022-05-24 DIAGNOSIS — S0081XA Abrasion of other part of head, initial encounter: Secondary | ICD-10-CM | POA: Diagnosis present

## 2022-05-24 DIAGNOSIS — R410 Disorientation, unspecified: Secondary | ICD-10-CM | POA: Diagnosis not present

## 2022-05-24 LAB — COMPREHENSIVE METABOLIC PANEL
ALT: 53 U/L — ABNORMAL HIGH (ref 0–44)
AST: 138 U/L — ABNORMAL HIGH (ref 15–41)
Albumin: 2.9 g/dL — ABNORMAL LOW (ref 3.5–5.0)
Alkaline Phosphatase: 89 U/L (ref 38–126)
Anion gap: 13 (ref 5–15)
BUN: 32 mg/dL — ABNORMAL HIGH (ref 8–23)
CO2: 18 mmol/L — ABNORMAL LOW (ref 22–32)
Calcium: 8.4 mg/dL — ABNORMAL LOW (ref 8.9–10.3)
Chloride: 103 mmol/L (ref 98–111)
Creatinine, Ser: 2.69 mg/dL — ABNORMAL HIGH (ref 0.61–1.24)
GFR, Estimated: 23 mL/min — ABNORMAL LOW (ref >60)
Glucose, Bld: 99 mg/dL (ref 70–99)
Potassium: 5.3 mmol/L — ABNORMAL HIGH (ref 3.5–5.1)
Sodium: 134 mmol/L — ABNORMAL LOW (ref 135–145)
Total Bilirubin: 1.8 mg/dL — ABNORMAL HIGH (ref 0.3–1.2)
Total Protein: 6.1 g/dL — ABNORMAL LOW (ref 6.5–8.1)

## 2022-05-24 LAB — PROTIME-INR
INR: 2.2 — ABNORMAL HIGH (ref 0.8–1.2)
Prothrombin Time: 24.6 seconds — ABNORMAL HIGH (ref 11.4–15.2)

## 2022-05-24 LAB — CBC WITH DIFFERENTIAL/PLATELET
Abs Immature Granulocytes: 0.07 10*3/uL (ref 0.00–0.07)
Basophils Absolute: 0 10*3/uL (ref 0.0–0.1)
Basophils Relative: 0 %
Eosinophils Absolute: 0 10*3/uL (ref 0.0–0.5)
Eosinophils Relative: 0 %
HCT: 41.1 % (ref 39.0–52.0)
Hemoglobin: 13.4 g/dL (ref 13.0–17.0)
Immature Granulocytes: 1 %
Lymphocytes Relative: 4 %
Lymphs Abs: 0.3 10*3/uL — ABNORMAL LOW (ref 0.7–4.0)
MCH: 32.8 pg (ref 26.0–34.0)
MCHC: 32.6 g/dL (ref 30.0–36.0)
MCV: 100.5 fL — ABNORMAL HIGH (ref 80.0–100.0)
Monocytes Absolute: 0.4 10*3/uL (ref 0.1–1.0)
Monocytes Relative: 6 %
Neutro Abs: 6.4 10*3/uL (ref 1.7–7.7)
Neutrophils Relative %: 89 %
Platelets: 155 10*3/uL (ref 150–400)
RBC: 4.09 MIL/uL — ABNORMAL LOW (ref 4.22–5.81)
RDW: 16.1 % — ABNORMAL HIGH (ref 11.5–15.5)
Smear Review: NORMAL
WBC: 7.2 10*3/uL (ref 4.0–10.5)
nRBC: 0 % (ref 0.0–0.2)

## 2022-05-24 LAB — TROPONIN I (HIGH SENSITIVITY)
Troponin I (High Sensitivity): 22 ng/L — ABNORMAL HIGH (ref <18)
Troponin I (High Sensitivity): 21 ng/L — ABNORMAL HIGH (ref <18)

## 2022-05-24 LAB — BRAIN NATRIURETIC PEPTIDE: B Natriuretic Peptide: 2406.1 pg/mL — ABNORMAL HIGH (ref 0.0–100.0)

## 2022-05-24 LAB — PROCALCITONIN: Procalcitonin: 3.17 ng/mL

## 2022-05-24 LAB — APTT: aPTT: 42 seconds — ABNORMAL HIGH (ref 24–36)

## 2022-05-24 LAB — CK: Total CK: 93 U/L (ref 49–397)

## 2022-05-24 LAB — LACTIC ACID, PLASMA
Lactic Acid, Venous: 4.5 mmol/L (ref 0.5–1.9)
Lactic Acid, Venous: 3.4 mmol/L (ref 0.5–1.9)
Lactic Acid, Venous: 3.2 mmol/L (ref 0.5–1.9)
Lactic Acid, Venous: 2.9 mmol/L (ref 0.5–1.9)

## 2022-05-24 LAB — SEDIMENTATION RATE: Sed Rate: 10 mm/hr (ref 0–20)

## 2022-05-24 MED ORDER — LACTATED RINGERS IV BOLUS (SEPSIS): 1000.0000 mL | Freq: Once | INTRAVENOUS | Status: DC

## 2022-05-24 MED ORDER — VANCOMYCIN HCL 1750 MG/350ML IV SOLN
1750.0000 mg | Freq: Once | INTRAVENOUS | Status: AC
  Administered 2022-05-24: 1750 mg via INTRAVENOUS
  Filled 2022-05-24: qty 350

## 2022-05-24 MED ORDER — HYDRALAZINE HCL 20 MG/ML IJ SOLN: 5.0000 mg | INTRAMUSCULAR | Status: DC | PRN

## 2022-05-24 MED ORDER — VANCOMYCIN HCL IN DEXTROSE 1-5 GM/200ML-% IV SOLN: 1000.0000 mg | Freq: Once | INTRAVENOUS | Status: DC

## 2022-05-24 MED ORDER — ADULT MULTIVITAMIN W/MINERALS CH
1.0000 | ORAL_TABLET | Freq: Every day | ORAL | Status: DC
  Administered 2022-05-24 – 2022-05-28 (×4): 1 via ORAL
  Filled 2022-05-24 (×5): qty 1

## 2022-05-24 MED ORDER — TAMSULOSIN HCL 0.4 MG PO CAPS
0.4000 mg | ORAL_CAPSULE | Freq: Every day | ORAL | Status: DC
  Administered 2022-05-24 – 2022-05-27 (×4): 0.4 mg via ORAL
  Filled 2022-05-24 (×4): qty 1

## 2022-05-24 MED ORDER — LACTATED RINGERS IV SOLN: INTRAVENOUS | Status: DC

## 2022-05-24 MED ORDER — ESCITALOPRAM OXALATE 10 MG PO TABS
20.0000 mg | ORAL_TABLET | Freq: Every day | ORAL | Status: DC
  Administered 2022-05-24 – 2022-05-28 (×5): 20 mg via ORAL
  Filled 2022-05-24 (×5): qty 2

## 2022-05-24 MED ORDER — AMIODARONE HCL 200 MG PO TABS
200.0000 mg | ORAL_TABLET | Freq: Two times a day (BID) | ORAL | Status: DC
  Administered 2022-05-24 – 2022-05-28 (×8): 200 mg via ORAL
  Filled 2022-05-24 (×8): qty 1

## 2022-05-24 MED ORDER — SODIUM CHLORIDE 0.9 % IV SOLN
1.0000 g | INTRAVENOUS | Status: DC
  Administered 2022-05-25: 1 g via INTRAVENOUS
  Filled 2022-05-24: qty 10

## 2022-05-24 MED ORDER — DIPHENHYDRAMINE HCL 50 MG/ML IJ SOLN: 12.5000 mg | Freq: Three times a day (TID) | INTRAMUSCULAR | Status: DC | PRN

## 2022-05-24 MED ORDER — OXYCODONE HCL 5 MG PO TABS
5.0000 mg | ORAL_TABLET | Freq: Four times a day (QID) | ORAL | Status: DC | PRN
  Administered 2022-05-24: 5 mg via ORAL
  Filled 2022-05-24: qty 1

## 2022-05-24 MED ORDER — SODIUM CHLORIDE 0.9 % IV SOLN
2.0000 g | Freq: Once | INTRAVENOUS | Status: AC
  Administered 2022-05-24: 2 g via INTRAVENOUS
  Filled 2022-05-24: qty 12.5

## 2022-05-24 MED ORDER — ACETAMINOPHEN 325 MG PO TABS: 325.0000 mg | ORAL_TABLET | Freq: Four times a day (QID) | ORAL | Status: DC | PRN

## 2022-05-24 MED ORDER — LACTATED RINGERS IV BOLUS (SEPSIS)
250.0000 mL | Freq: Once | INTRAVENOUS | Status: AC
  Administered 2022-05-24: 250 mL via INTRAVENOUS

## 2022-05-24 MED ORDER — LACTATED RINGERS IV BOLUS
250.0000 mL | Freq: Once | INTRAVENOUS | Status: AC
  Administered 2022-05-24: 250 mL via INTRAVENOUS

## 2022-05-24 MED ORDER — APIXABAN 5 MG PO TABS
5.0000 mg | ORAL_TABLET | Freq: Two times a day (BID) | ORAL | Status: DC
  Administered 2022-05-24 – 2022-05-28 (×8): 5 mg via ORAL
  Filled 2022-05-24 (×8): qty 1

## 2022-05-24 MED ORDER — ALBUTEROL SULFATE (2.5 MG/3ML) 0.083% IN NEBU: 3.0000 mL | INHALATION_SOLUTION | RESPIRATORY_TRACT | Status: DC | PRN

## 2022-05-24 MED ORDER — CLOPIDOGREL BISULFATE 75 MG PO TABS
75.0000 mg | ORAL_TABLET | Freq: Every day | ORAL | Status: DC
  Administered 2022-05-24 – 2022-05-28 (×5): 75 mg via ORAL
  Filled 2022-05-24 (×5): qty 1

## 2022-05-24 MED ORDER — LACTATED RINGERS IV BOLUS (SEPSIS)
500.0000 mL | Freq: Once | INTRAVENOUS | Status: AC
  Administered 2022-05-24: 500 mL via INTRAVENOUS

## 2022-05-24 MED ORDER — ATORVASTATIN CALCIUM 20 MG PO TABS
80.0000 mg | ORAL_TABLET | Freq: Every day | ORAL | Status: DC
  Administered 2022-05-24 – 2022-05-28 (×5): 80 mg via ORAL
  Filled 2022-05-24: qty 4
  Filled 2022-05-24: qty 8
  Filled 2022-05-24 (×3): qty 4

## 2022-05-24 MED ORDER — SODIUM ZIRCONIUM CYCLOSILICATE 10 G PO PACK
10.0000 g | PACK | Freq: Once | ORAL | Status: AC
  Administered 2022-05-24: 10 g via ORAL
  Filled 2022-05-24: qty 1

## 2022-05-24 NOTE — Progress Notes (Signed)
Attempted to ask pt admission questions. Pt states he has never had to do this before and will not answer anymore questions.

## 2022-05-24 NOTE — ED Notes (Signed)
Pt will come to room 24 after scans (CT).

## 2022-05-24 NOTE — ED Triage Notes (Signed)
Pt arrives via [facility] with c/o of a fall at 0300 and then had a few more falls this morning. Pt has a small lac on the top of his head. No LOC, no dizziness. Pt is on Plavix and eliquis. Pt is supposed to use a Yerian/cane but was not using them today. Pt denies any pain.

## 2022-05-24 NOTE — H&P (Addendum)
History and Physical    [name]:[id number] DOB: 04-22-1940 DOA: 05/24/2022  Referring MD/NP/PA:   PCP: Margaretann Loveless, MD   Patient coming from:  The patient is coming from ALF  Chief Complaint: fall and right lower leg pain  HPI: Curtis Wagner is a 82 y.o. 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  Per her daughter at the bedside, patient fell in the early morning at about 3 AM.  He is supposed to use Valli, but did not use it. No loss of consciousness.  Patient has abrasion in forehead.  No unilateral numbness or tinglings in extremities.  No facial droop or slurred speech.  Patient does not have chest pain, cough, shortness of breath.  No nausea, vomiting or abdominal pain.  Patient has diarrhea in the past several days, several watery diarrhea each day.  No fever or chills.  No symptoms of UTI.  Patient reports right lower leg pain which has been going on for about 3 days, also has swelling and erythema in left lower leg.  Data reviewed independently and ED Course: pt was found to have WBC 7.2, BNP 2406, lactic acid 4.5, troponin level 22, 21, potassium 5.3, worsening renal function with creatinine 2.69, BUN 32 and GFR 23 (recent baseline creatinine 1.39 on 04/17/2022), abnormal liver function (ALP 89, AST 138, ALT 53, total bilirubin 1.8).  Temperature normal, soft blood pressure 86/61 which improved to 100/83 after giving 750 cc LR bolus in ED --> 98/70, heart rate 69 --> 103, RR 16-->23, oxygen saturation 98% on room air.  Chest x-ray negative.  Images are negative for acute injury including CT of head, CT of C-spine, x-ray of her bilateral ankles, x-ray of left forearm.  Right lower extremity venous Doppler negative for DVT but showed Baker's cyst.  Patient is admitted to telemetry bed as inpatient.   EKG: I have personally  reviewed.  Atrial fibrillation, QTc 541, anteroseptal infarction pattern, low voltage.   Review of Systems:   General: no fevers, chills, no body weight gain, has fatigue HEENT: no blurry vision, hearing changes or sore throat Respiratory: no dyspnea, coughing, wheezing CV: no chest pain, no palpitations GI: no nausea, vomiting, abdominal pain, diarrhea, constipation GU: no dysuria, burning on urination, increased urinary frequency, hematuria  Ext: Right lower leg is erythematous, swelling with pain Neuro: no unilateral weakness, numbness, or tingling, no vision change or hearing loss. Has fall Skin: has skin abrasion in forehead MSK: No muscle spasm, no deformity, no limitation of range of movement in spin Heme: No easy bruising.  Travel history: No recent long distant travel.   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   DVT (deep venous thrombosis) (HCC)    History of kidney stones    Hyperlipidemia    Hypertension    PAF (paroxysmal atrial fibrillation) (HCC)     Past Surgical History:  Procedure Laterality Date   EYE SURGERY Right    /w IOL   LOWER EXTREMITY ANGIOGRAPHY Left 04/12/2022   Procedure: Lower Extremity Angiography;  Surgeon: Annice Needy, MD;  Location: [facility] INVASIVE CV LAB;  Service: Cardiovascular;  Laterality: Left;   LOWER EXTREMITY INTERVENTION Left 04/13/2022   Procedure: LOWER EXTREMITY INTERVENTION;  Surgeon: Annice Needy, MD;  Location: [facility] INVASIVE CV LAB;  Service: Cardiovascular;  Laterality: Left;   SHOULDER SURGERY Left 1961   TONSILLECTOMY     TOTAL SHOULDER ARTHROPLASTY Right 01/01/2015   Procedure: RIGHT TOTAL SHOULDER ARTHROPLASTY;  Surgeon: Loreta Ave, MD;  Location: [facility];  Service: Orthopedics;  Laterality: Right;     Social History:  reports that he has quit smoking. He has quit using smokeless tobacco.  His smokeless tobacco use included chew. He reports that he does not drink alcohol and does not use drugs.  Family History:  Family History  Problem Relation Age of Onset   Rheumatic fever Mother    Stroke Mother    Heart disease Mother    Stroke Father      Prior to Admission medications   Medication Sig Start Date End Date Taking? Authorizing Provider  allopurinol (ZYLOPRIM) 300 MG tablet Take 1 tablet (300 mg total) by mouth daily. Last dispense not on file 04/20/22   Margaretann Loveless, MD  amiodarone (PACERONE) 200 MG tablet Take 1 tablet (200 mg total) by mouth 2 (two) times daily. 04/20/22   Margaretann Loveless, MD  apixaban (ELIQUIS) 5 MG TABS tablet Take 1 tablet (5 mg total) by mouth 2 (two) times daily. 05/18/22   Furth, Cadence H, PA-C  atorvastatin (LIPITOR) 80 MG tablet Take 80 mg by mouth daily. Last dispense 03/2022 30DS 01/12/21   [provider]  clopidogrel (PLAVIX) 75 MG tablet Take 1 tablet (75 mg total) by mouth daily. 05/18/22   Furth, Cadence H, PA-C  escitalopram (LEXAPRO) 20 MG tablet Take 1 tablet (20 mg total) by mouth daily. 05/01/22   Miki Kins, FNP  JARDIANCE 10 MG TABS tablet Take 1 tablet (10 mg total) by mouth every morning. Last dispense 02/2022 30DS 04/20/22   Margaretann Loveless, MD  metoprolol succinate (TOPROL-XL) 25 MG 24 hr tablet Take 1 tablet (25 mg total) by mouth daily. Take with or immediately following a meal. 04/18/22   Marrion Coy, MD  Multiple Vitamins-Minerals (CENTRUM SILVER ADULT 50+) TABS Take 1 tablet by mouth daily.    [provider]  nitroGLYCERIN (NITROSTAT) 0.4 MG SL tablet Place 0.4 mg under the tongue every 5 (five) minutes as needed for chest pain. 08/26/20   [provider]  potassium chloride SA (KLOR-CON M) 20 MEQ tablet Take 2 tablets (40 mEq total) by mouth daily. Last dispense 12/2021 30DS 04/20/22   Margaretann Loveless, MD   spironolactone (ALDACTONE) 25 MG tablet Take 0.5 tablets (12.5 mg total) by mouth daily. 05/16/22 05/11/23  Furth, Cadence H, PA-C  tamsulosin (FLOMAX) 0.4 MG CAPS capsule Take 0.4 mg by mouth at bedtime. Last dispense 03/2022 30DS 03/15/15   [provider]    Physical Exam: Vitals:   05/24/22 1545 05/24/22 1600 05/24/22 1700 05/24/22 1730  BP:  (!) 93/43 98/70 95/83   Pulse: 85 78 (!) 103 81  Resp: 19 18 (!) 23 20  Temp:      TempSrc:      SpO2: 98% 100% 96% 95%  Weight:      Height:       General: Not in acute distress HEENT:       Eyes: PERRL, EOMI, no scleral icterus.       ENT: No discharge from the ears and nose, no pharynx injection, no tonsillar enlargement.  Neck: No JVD, no bruit, no mass felt. Heme: No neck lymph node enlargement. Cardiac: S1/S2, RRR, No murmurs, No gallops or rubs. Respiratory: No rales, wheezing, rhonchi or rubs. GI: Soft, nondistended, nontender, no rebound pain, no organomegaly, BS present. GU: No hematuria Ext: Has tenderness, swelling, erythema, warmth in right lower leg.     Musculoskeletal: No joint deformities, No joint redness or warmth, no limitation of ROM in spin. Skin: Has skin abrasion in forehead. Neuro: Alert, oriented X3, cranial nerves II-XII grossly intact, moves all extremities normally.  Psych: Patient is not psychotic, no suicidal or hemocidal ideation.  Labs on Admission: I have personally reviewed following labs and imaging studies  CBC: Recent Labs  Lab 05/24/22 1309  WBC 7.2  NEUTROABS 6.4  HGB 13.4  HCT 41.1  MCV 100.5*  PLT 155   Basic Metabolic Panel: Recent Labs  Lab 05/24/22 1309  NA 134*  K 5.3*  CL 103  CO2 18*  GLUCOSE 99  BUN 32*  CREATININE 2.69*  CALCIUM 8.4*   GFR: Estimated Creatinine Clearance: 21.8 mL/min (A) (by C-G formula based on SCr of 2.69 mg/dL (H)). Liver Function Tests: Recent Labs  Lab 05/24/22 1309  AST 138*  ALT 53*  ALKPHOS 89  BILITOT 1.8*  PROT  6.1*  ALBUMIN 2.9*   No results for input(s): "LIPASE", "AMYLASE" in the last 168 hours. No results for input(s): "AMMONIA" in the last 168 hours. Coagulation Profile: Recent Labs  Lab 05/24/22 1309  INR 2.2*   Cardiac Enzymes: Recent Labs  Lab 05/24/22 1309  CKTOTAL 93   BNP (last 3 results) No results for input(s): "PROBNP" in the last 8760 hours. HbA1C: No results for input(s): "HGBA1C" in the last 72 hours. CBG: No results for input(s): "GLUCAP" in the last 168 hours. Lipid Profile: No results for input(s): "CHOL", "HDL", "LDLCALC", "TRIG", "CHOLHDL", "LDLDIRECT" in the last 72 hours. Thyroid Function Tests: No results for input(s): "TSH", "T4TOTAL", "FREET4", "T3FREE", "THYROIDAB" in the last 72 hours. Anemia Panel: No results for input(s): "VITAMINB12", "FOLATE", "FERRITIN", "TIBC", "IRON", "RETICCTPCT" in the last 72 hours. Urine analysis:    Component Value Date/Time   COLORURINE YELLOW (A) 04/11/2022 2135   APPEARANCEUR CLEAR (A) 04/11/2022 2135   LABSPEC 1.025 04/11/2022 2135   PHURINE 5.0 04/11/2022 2135   GLUCOSEU >=500 (A) 04/11/2022 2135   HGBUR SMALL (A) 04/11/2022 2135   BILIRUBINUR NEGATIVE 04/11/2022 2135   KETONESUR NEGATIVE 04/11/2022 2135   PROTEINUR 30 (A) 04/11/2022 2135   NITRITE NEGATIVE 04/11/2022 2135   LEUKOCYTESUR NEGATIVE 04/11/2022 2135   Sepsis Labs: @LABRCNTIP (procalcitonin:4,lacticidven:4) )No results found for this or any previous visit (from the past 240 hour(s)).   Radiological Exams on Admission: CT ABDOMEN PELVIS WO CONTRAST  Result Date: 05/24/2022 CLINICAL DATA:  Sepsis EXAM: CT ABDOMEN AND PELVIS WITHOUT CONTRAST TECHNIQUE: Multidetector CT imaging of the abdomen and pelvis was performed following the standard protocol without IV contrast. RADIATION DOSE REDUCTION: This exam was performed according to the departmental dose-optimization program which includes automated exposure control, adjustment of the mA and/or kV according  to patient size and/or use of iterative reconstruction technique. COMPARISON:  None Available. FINDINGS: Lower chest: Moderate-sized bilateral pleural effusions with overlying atelectasis. The heart is mildly enlarged. No pericardial effusion. The distal esophagus is grossly normal. Aortic and coronary artery calcifications are noted. Hepatobiliary: No hepatic lesions are identified without contrast. No intrahepatic biliary dilatation. The gallbladder contains several calcified gallstones but no definite CT findings for acute cholecystitis. No common  bile duct dilatation. Pancreas: Moderate age related atrophy but no mass, inflammation or ductal dilatation. Spleen: Normal sized spleen. No splenic lesions. Small amount of perisplenic fluid noted. Adrenals/Urinary Tract: Bilateral low-attenuation adrenal gland nodules consistent with benign adenomas. No further imaging evaluation follow-up is necessary. Simple bilateral renal cysts not requiring any further imaging evaluation or follow-up. 12 mm left renal calculus with overlying significant renal cortical thinning/scarring. This could be in a E calyceal diverticulum. Significant left-sided hydronephrosis and hydroureter all the way down to the bladder without evidence of an obstructing calculus or mass. The right ureter is normal in caliber. There is a small calculus noted in the right-side bladder no bladder mass asymmetric bladder thickening. Stomach/Bowel: The stomach, duodenum, small and colon are grossly normal. The terminal ileum and appendix are normal. Sigmoid colon diverticulosis without findings for acute diverticulitis. Vascular/Lymphatic: Advanced atherosclerotic calcification involving the aorta and branch vessels but no aneurysm. No mesenteric or retroperitoneal mass adenopathy. Small scattered lymph nodes are noted. Reproductive: Enlarged prostate gland with mild impression on the base of the bladder. The seminal vesicles are unremarkable. Other: Small  amount of free fluid around the liver and spleen, in the pericolic gutters and in the pelvis. Diffuse subcutaneous edema suggesting anasarca. Musculoskeletal: No significant bony findings. IMPRESSION: 1. Significant left-sided hydronephrosis and hydroureter all the way down to the bladder without evidence of an obstructing calculus or mass. 2. 12 mm left renal calculus with overlying significant renal cortical thinning/scarring. This could be in a calyceal diverticulum. 3. Small calculus in the right-side of the bladder. 4. Moderate-sized bilateral pleural effusions with overlying atelectasis. 5. Small amount of abdominal/pelvic ascites. 6. Cholelithiasis. 7. 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 8. Advanced atherosclerotic calcification involving the aorta and branch vessels. Aortic Atherosclerosis (ICD10-I70.0). Electronically Signed   By: Rudie Meyer M.D.   On: 05/24/2022 15:16   US Venous Img Lower Unilateral Right  Result Date: 05/24/2022 CLINICAL DATA:  Right lower extremity swelling EXAM: RIGHT LOWER EXTREMITY VENOUS DOPPLER ULTRASOUND TECHNIQUE: Gray-scale sonography with compression, as well as color and duplex ultrasound, were performed to evaluate the deep venous system(s) from the level of the common femoral vein through the popliteal and proximal calf veins. COMPARISON:  None Available. FINDINGS: VENOUS Normal compressibility of the common femoral, superficial femoral, and popliteal veins, as well as the visualized calf veins. Visualized portions of profunda femoral vein and great saphenous vein unremarkable. No filling defects to suggest DVT on grayscale or color Doppler imaging. Doppler waveforms show normal direction of venous flow, normal respiratory plasticity and response to augmentation. Limited views of the contralateral common femoral vein are unremarkable. OTHER None. Limitations: Complex Baker's cyst 5.9 by 2.4 by 2.2 cm in the popliteal fossa. IMPRESSION: 1. No right lower  extremity DVT. 2. Complex Baker's cyst. Electronically Signed   By: Gaylyn Rong M.D.   On: 05/24/2022 14:36   DG Forearm Left  Result Date: 05/24/2022 CLINICAL DATA:  Trauma, fall EXAM: LEFT FOREARM - 2 VIEW COMPARISON:  None Available. FINDINGS: No recent fracture or dislocation is seen. There is no displacement of posterior fat pad in the elbow. There are smooth marginated calcifications at the attachment of triceps to the olecranon process. IMPRESSION: No recent fracture or dislocation is seen in the left forearm. Smoothly marginated calcifications at the attachment of triceps tendon to the olecranon process may suggest calcific tendinosis. Electronically Signed   By: Ernie Avena M.D.   On: 05/24/2022 14:03   DG Ankle Complete Right  Result Date: 05/24/2022 CLINICAL DATA:  Trauma, fall, pain EXAM: RIGHT ANKLE - COMPLETE 3+ VIEW COMPARISON:  None FINDINGS: No recent fracture or dislocation is seen. Small smooth myelinated calcifications are noted adjacent to the tips of medial and lateral malleoli, possibly residual from previous injury. There is calcification in the interosseous membrane between the distal shafts of tibia and fibula and possible fusion of inferior radioulnar joint. These findings most likely are related to previous trauma. Plantar spur is seen in calcaneus. Calcifications are seen at the attachment of Achilles tendon to the calcaneus suggesting calcific tendinosis. Bony spurs are noted in the dorsal aspect of intertarsal joints. Arterial calcifications are seen in soft tissues. There is soft tissue swelling around the ankle. IMPRESSION: No recent fracture or dislocation is seen in right ankle. Other findings as described in the body of the report. Electronically Signed   By: Ernie Avena M.D.   On: 05/24/2022 14:00   DG Ankle Complete Left  Result Date: 05/24/2022 CLINICAL DATA:  Trauma, fall EXAM: LEFT ANKLE COMPLETE - 3+ VIEW COMPARISON:  04/11/2022 FINDINGS: No  recent fracture or dislocation is seen. Bony spurs are noted at the tips of medial and lateral malleoli. There are small calcific densities adjacent to the medial and lateral malleoli with no significant interval change, possibly residual from previous injury. There is soft tissue swelling around the ankle. Bony spurs are noted in the dorsal aspect of intertarsal joints, more so in the talonavicular joint. Plantar spur is seen in calcaneus. Small linear calcification in Achilles tendon close to the calcaneus may suggest calcific tendinosis with no interval change. Arterial calcifications are seen in soft tissues. Overall, no significant interval changes are noted. IMPRESSION: No recent fracture or dislocation is seen. Other findings as described in the body of the report. Electronically Signed   By: Ernie Avena M.D.   On: 05/24/2022 13:57   DG Chest Port 1 View  Result Date: 05/24/2022 CLINICAL DATA:  Fall at 3 a.m.  Pain.  Possible sepsis. EXAM: PORTABLE CHEST 1 VIEW COMPARISON:  None Available. FINDINGS: Right shoulder arthroplasty. Advanced left glenohumeral joint osteoarthritis. Remote left rib fractures. Midline trachea. Reverse apical lordotic positioning. Cardiomegaly accentuated by AP portable technique. Atherosclerosis in the transverse aorta. No pleural effusion or pneumothorax. Low lung volumes with resultant pulmonary interstitial prominence. No congestive failure. Skin fold over the upper right hemithorax. Mild left base volume loss. IMPRESSION: No acute findings. Cardiomegaly without congestive failure. Aortic Atherosclerosis (ICD10-I70.0). Electronically Signed   By: Jeronimo Greaves M.D.   On: 05/24/2022 13:48   CT Head Wo Contrast  Result Date: 05/24/2022 CLINICAL DATA:  Larey Seat.  Hit head. EXAM: CT HEAD WITHOUT CONTRAST CT CERVICAL SPINE WITHOUT CONTRAST TECHNIQUE: Multidetector CT imaging of the head and cervical spine was performed following the standard protocol without intravenous  contrast. Multiplanar CT image reconstructions of the cervical spine were also generated. RADIATION DOSE REDUCTION: This exam was performed according to the departmental dose-optimization program which includes automated exposure control, adjustment of the mA and/or kV according to patient size and/or use of iterative reconstruction technique. COMPARISON:  Head CT 05/05/2022 FINDINGS: CT HEAD FINDINGS Brain: Stable age related cerebral atrophy, ventriculomegaly and advanced periventricular white matter disease. No extra-axial fluid collections are identified. No CT findings for acute hemispheric infarction or intracranial hemorrhage. No mass lesions. The brainstem and cerebellum are normal. Vascular: Stable vascular calcifications without aneurysm or hyperdense vessels. Skull: No skull fracture or bone lesions. Sinuses/Orbits: The paranasal sinuses and mastoid air  cells are clear. The globes are intact. Other: No scalp lesions or scalp hematoma. CT CERVICAL SPINE FINDINGS Alignment: Normal Skull base and vertebrae: No acute fracture. No primary bone lesion or focal pathologic process. Soft tissues and spinal canal: No prevertebral fluid or swelling. No visible canal hematoma. 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 There is moderate multilevel foraminal stenosis due to uncinate spurring and facet disease. Upper chest: The lung apices are grossly clear. Other: Bilateral carotid artery calcifications. IMPRESSION: 1. Stable age related cerebral atrophy, ventriculomegaly and advanced periventricular white matter disease. 2. No acute intracranial findings or skull fracture. 3. Normal alignment of the cervical spine without acute fracture. 4. 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 Electronically Signed   By: Rudie Meyer  M.D.   On: 05/24/2022 13:39   CT Cervical Spine Wo Contrast  Result Date: 05/24/2022 CLINICAL DATA:  Larey Seat.  Hit head. EXAM: CT HEAD WITHOUT CONTRAST CT CERVICAL SPINE WITHOUT CONTRAST TECHNIQUE: Multidetector CT imaging of the head and cervical spine was performed following the standard protocol without intravenous contrast. Multiplanar CT image reconstructions of the cervical spine were also generated. RADIATION DOSE REDUCTION: This exam was performed according to the departmental dose-optimization program which includes automated exposure control, adjustment of the mA and/or kV according to patient size and/or use of iterative reconstruction technique. COMPARISON:  Head CT 05/05/2022 FINDINGS: CT HEAD FINDINGS Brain: Stable age related cerebral atrophy, ventriculomegaly and advanced periventricular white matter disease. No extra-axial fluid collections are identified. No CT findings for acute hemispheric infarction or intracranial hemorrhage. No mass lesions. The brainstem and cerebellum are normal. Vascular: Stable vascular calcifications without aneurysm or hyperdense vessels. Skull: No skull fracture or bone lesions. Sinuses/Orbits: The paranasal sinuses and mastoid air cells are clear. The globes are intact. Other: No scalp lesions or scalp hematoma. CT CERVICAL SPINE FINDINGS Alignment: Normal Skull base and vertebrae: No acute fracture. No primary bone lesion or focal pathologic process. Soft tissues and spinal canal: No prevertebral fluid or swelling. No visible canal hematoma. 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 There is moderate multilevel foraminal stenosis due to uncinate spurring and facet disease. Upper chest: The lung apices are grossly clear. Other: Bilateral carotid artery calcifications. IMPRESSION: 1. Stable age related cerebral atrophy,  ventriculomegaly and advanced periventricular white matter disease. 2. No acute intracranial findings or skull fracture. 3. Normal alignment of the cervical spine without acute fracture. 4. 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 Electronically Signed   By: Rudie Meyer M.D.   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  No fever or leukocytosis.  Patient meets criteria for severe sepsis with heart rate 103, RR 23, lactic acid 4.5 --> 3.4.  Initially had hypotension which responded to IV fluid resuscitation.  Also has worsening renal function and elevated troponin. - will admit to tele bed as inpatient - Empiric antimicrobial treatment with vancomycin and Rocephin (patient received 1 dose of cefepime in ED) - PRN Zofran for nausea, oxycodone for pain - Blood cultures x 2  - ESR and CRP - will get Procalcitonin and trend lactic acid levels per sepsis protocol. - IVF:  pt received total of 750 cc of LR bolus in ED, will give another 250 cc of LR, then, followed by 50 cc/h of RL (patient has congestive heart failure with EF < 20, limiting aggressive IV fluids treatment).  Fall at home, initial encounter: images are  negative for acute injury -PT/OT -fall precaution  Abnormal LFTs: Possibly due to sepsis. -Patient has worsening renal function, cannot use NSAIDs for fever.   -Will use Tylenol carefully, 320 mg every 6 hours as needed for fever -check hepatitis panel  PAF (paroxysmal atrial fibrillation) (HCC) -Hold metoprolol due to soft blood pressure -Eliquis -Amiodarone  Acute renal failure superimposed on stage 3a chronic kidney disease (HCC): -Avoid using renal toxic medications -Hold spironolactone  Hypertension -Hold metoprolol due to soft blood pressure -IV hydralazine as needed  Hyperlipidemia -Lipitor  Chronic systolic CHF (congestive heart failure) (HCC): 2D echo on 04/12/2022 showed EF<20%.  Patient has significant elevated BNP 2406, but no shortness of breath or oxygen desaturation.  No pulm edema chest x-ray, does not seem to have acute CHF exacerbation.  Patient is obviously at high risk of developing CHF exacerbation. -Watch volume status closely -Careful use IV fluid -Hold spironolactone due to worsening renal function and soft blood pressure  Hyperkalemia: Potassium 5.3 -10 g of leukoma  Diarrhea -Check C. difficile and GI pathogen panel  Myocardial injury: Troponin level 22 -> 21.  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    Disposition Plan:  Anticipate discharge back to previous environment  Consults called:  none  Admission status and Level of care: Telemetry Medical:    as inpt       Dispo: The patient is from: ALF              Anticipated d/c is to: ALF              Anticipated d/c date is: 2 days              Patient currently is not medically stable to d/c.    Severity of Illness:  The appropriate  patient status for this patient is INPATIENT. Inpatient status is judged to be reasonable and necessary in order to provide the required intensity of service to ensure the patient's safety. The patient's presenting symptoms, physical exam findings, and initial radiographic and laboratory data in the context of their chronic comorbidities is felt to place them at high risk for further clinical deterioration. Furthermore, it is not anticipated that the patient will be medically stable for discharge from the hospital  within 2 midnights of admission.   * I certify that at the point of admission it is my clinical judgment that the patient will require inpatient hospital care spanning beyond 2 midnights from the point of admission due to high intensity of service, high risk for further deterioration and high frequency of surveillance required.*       Date of Service 05/24/2022    Lorretta Harp [facility]   If 7PM-7AM, please contact night-coverage [email or web address] 05/24/2022, 6:16 PM

## 2022-05-24 NOTE — Code Documentation (Signed)
CODE SEPSIS - PHARMACY COMMUNICATION  **Broad Spectrum Antibiotics should be administered within 1 hour of Sepsis diagnosis**  Time Code Sepsis Called/Page Received: 1358  Antibiotics Ordered: cefepime, vancomycin  Time of 1st antibiotic administration: 1440  Additional action taken by pharmacy:   If necessary, Name of Provider/Nurse Contacted:     Sharen Hones ,PharmD Clinical Pharmacist  05/24/2022  4:58 PM

## 2022-05-24 NOTE — Progress Notes (Signed)
[facility]link is following code sepsis 

## 2022-05-24 NOTE — ED Notes (Signed)
Pharmacy messaged to send vanc to main ED as pt will come to room 24 after CT.

## 2022-05-24 NOTE — ED Provider Notes (Addendum)
[facility] Provider Note    Event Date/Time   First MD Initiated Contact with Patient 05/24/22 1225     (approximate)   History   Fall   HPI  Curtis Wagner is a 82 y.o. male past medical history significant for DVT on anticoagulation, history of hypertension, paroxysmal atrial fibrillation, hyperlipidemia, known arterial thrombus, who presents to the emergency department with multiple falls.  Multiple falls since 3:00 this morning.  Did have head injury but no loss of consciousness.  States that he feels weak and like something might be wrong with his ankles.  Did note some worsening swelling to his right lower leg.  States he has been compliant with his anticoagulation.  Denies any cough or shortness of breath.  Denies any nausea or vomiting.     Physical Exam   Triage Vital Signs: ED Triage Vitals  Enc Vitals Group     BP 05/24/22 1229 (!) 86/61     Pulse Rate 05/24/22 1229 69     Resp 05/24/22 1229 16     Temp --      Temp src --      SpO2 05/24/22 1229 98 %     Weight 05/24/22 1236 182 lb (82.6 kg)     Height 05/24/22 1236 5\' 11"  (1.803 m)     Head Circumference --      Peak Flow --      Pain Score 05/24/22 1235 0     Pain Loc --      Pain Edu? --      Excl. in GC? --     Most recent vital signs: Vitals:   05/24/22 1229 05/24/22 1414  BP: (!) 86/61 100/83  Pulse: 69   Resp: 16   SpO2: 98%     Physical Exam Constitutional:      Appearance: He is well-developed. He is ill-appearing.  HENT:     Head:     Comments: Abrasion to the forehead, ecchymosis to the forehead.  No septal hematoma. Eyes:     Conjunctiva/sclera: Conjunctivae normal.  Neck:     Comments: No midline cervical spine tenderness.  Cardiovascular:     Rate and Rhythm: Regular rhythm.  Pulmonary:     Effort: No respiratory distress.  Genitourinary:    Comments: No swelling or erythema to the groin Musculoskeletal:     Cervical back: Normal range of motion.      Right lower leg: Edema present.     Comments: Right lower extremity erythema and warmth with red streaking up to the groin.  Skin:    General: Skin is warm.     Capillary Refill: Capillary refill takes 2 to 3 seconds.  Neurological:     Mental Status: He is alert. Mental status is at baseline.     IMPRESSION / MDM / ASSESSMENT AND PLAN / ED COURSE  I reviewed the triage vital signs and the nursing notes.  On arrival to the emergency department patient was hypotensive at 86/61.  Multiple falls.  Concern for sepsis so blood cultures obtained.  Patient has a history of low EF, felt that 30 cc/kg of IV fluids would be detrimental to the patient so given 250 bolus and will reevaluate.  Differential diagnosis including dehydration, medication side effect, sepsis, cellulitis, DVT, PE  EKG  I, Corena Herter, the attending physician, personally viewed and interpreted this ECG.  Low voltage, incomplete bundle branch block.  No obvious P waves present.  No significant  ST elevation or depression.  No signs of acute ischemia or dysrhythmia.  No tachycardic or bradycardic dysrhythmias while on cardiac telemetry.  RADIOLOGY I independently reviewed imaging, my interpretation of imaging: CT scan of the head without signs of intracranial hemorrhage.  Chest x-ray with no focal findings consistent with pneumonia.  LABS (all labs ordered are listed, but only abnormal results are displayed) Labs interpreted as -    Labs Reviewed  LACTIC ACID, PLASMA - Abnormal; Notable for the following components:      Result Value   Lactic Acid, Venous 4.5 (*)    All other components within normal limits  COMPREHENSIVE METABOLIC PANEL - Abnormal; Notable for the following components:   Sodium 134 (*)    Potassium 5.3 (*)    CO2 18 (*)    BUN 32 (*)    Creatinine, Ser 2.69 (*)    Calcium 8.4 (*)    Total Protein 6.1 (*)    Albumin 2.9 (*)    AST 138 (*)    ALT 53 (*)    Total Bilirubin 1.8 (*)     GFR, Estimated 23 (*)    All other components within normal limits  CBC WITH DIFFERENTIAL/PLATELET - Abnormal; Notable for the following components:   RBC 4.09 (*)    MCV 100.5 (*)    RDW 16.1 (*)    Lymphs Abs 0.3 (*)    All other components within normal limits  PROTIME-INR - Abnormal; Notable for the following components:   Prothrombin Time 24.6 (*)    INR 2.2 (*)    All other components within normal limits  APTT - Abnormal; Notable for the following components:   aPTT 42 (*)    All other components within normal limits  TROPONIN I (HIGH SENSITIVITY) - Abnormal; Notable for the following components:   Troponin I (High Sensitivity) 22 (*)    All other components within normal limits  CULTURE, BLOOD (ROUTINE X 2)  CULTURE, BLOOD (ROUTINE X 2)  CK  LACTIC ACID, PLASMA  URINALYSIS, W/ REFLEX TO CULTURE (INFECTION SUSPECTED)  BRAIN NATRIURETIC PEPTIDE  TROPONIN I (HIGH SENSITIVITY)     MDM    On reevaluation after 250 bolus patient blood pressure improved to 100/83.  Given another 500 bolus and started on maintenance fluid.  Given concern for right lower extremity cellulitis started on IV cefepime and vancomycin given his hypotension.  No signs of Fournier's gangrene.  Clinical picture is not consistent with a necrotizing soft tissue infection.  Lactic acid came back elevated significantly at 4.5.  INR 2.2.  Acute kidney injury with creatinine elevated at 2.69.  Does have an elevated potassium at 5.3.  Elevated LFTs.  Troponin mildly elevated.  Adding on a CT abdomen and pelvis without contrast given elevation of LFTs and mentioned abdominal pain last week to his daughter.  Low suspicion for mesenteric ischemia.  Clinical picture most consistent with sepsis secondary to right lower extremity cellulitis.  Consulted hospitalist for admission.   PROCEDURES:  Critical Care performed: yes  .Critical Care  Performed by: Corena Herter, MD Authorized by: Corena Herter, MD    Critical care provider statement:    Critical care time (minutes):  30   Critical care time was exclusive of:  Separately billable procedures and treating other patients   Critical care was necessary to treat or prevent imminent or life-threatening deterioration of the following conditions:  Sepsis   Critical care was time spent personally by me on the following activities:  Development of treatment plan with patient or surrogate, discussions with consultants, evaluation of patient's response to treatment, examination of patient, ordering and review of laboratory studies, ordering and review of radiographic studies, ordering and performing treatments and interventions, pulse oximetry, re-evaluation of patient's condition and review of old charts   Patient's presentation is most consistent with acute presentation with potential threat to life or bodily function.   MEDICATIONS ORDERED IN ED: Medications  lactated ringers infusion (has no administration in time range)  lactated ringers bolus 500 mL (has no administration in time range)  ceFEPIme (MAXIPIME) 2 g in sodium chloride 0.9 % 100 mL IVPB (has no administration in time range)  vancomycin (VANCOREADY) IVPB 1750 mg/350 mL (has no administration in time range)  lactated ringers bolus 250 mL (250 mLs Intravenous New Bag/Given 05/24/22 1342)    FINAL CLINICAL IMPRESSION(S) / ED DIAGNOSES   Final diagnoses:  Fall, initial encounter  Injury of head, initial encounter  Cellulitis of right lower extremity  Hypotension, unspecified hypotension type     Rx / DC Orders   ED Discharge Orders     None        Note:  This document was prepared using Dragon voice recognition software and may include unintentional dictation errors.   Corena Herter, MD 05/24/22 1431    Corena Herter, MD 05/24/22 [phone]

## 2022-05-24 NOTE — ED Notes (Signed)
Dr Clyde Lundborg ijn with pt and family/

## 2022-05-24 NOTE — Consult Note (Signed)
Pharmacy Antibiotic Note  Curtis Wagner is a 82 y.o. male admitted on 05/24/2022 with sepsis. 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 Patient has had multiple falls and concern for RLE cellulitis.Patient has no documented allergies to antimicrobials. Pharmacy has been consulted for vancomycin dosing.  Plan: Day 1 of antibiotics Completed vancomycin 1750 mg IV x1 ine ED. Check random vanc level tomorrow given AKI (SCr 2.69, baseline 1.4 in 03/2022)  Patient is also on ceftriaxone 1 g IV Q24H Continue to monitor renal function and follow culture results  Height: 5\' 11"  (180.3 cm) Weight: 82.6 kg (182 lb) IBW/kg (Calculated) : 75.3  Temp (24hrs), Avg:98.3 F (36.8 C), Min:98.3 F (36.8 C), Max:98.3 F (36.8 C)  Recent Labs  Lab 05/24/22 1309 05/24/22 1511  WBC 7.2  --   CREATININE 2.69*  --   LATICACIDVEN 4.5* 3.4*    Estimated Creatinine Clearance: 21.8 mL/min (A) (by C-G formula based on SCr of 2.69 mg/dL (H)).    Allergies  Allergen Reactions   Morphine Other (See Comments)   Morphine And Related Other (See Comments)    HALLUCINATIONS    Antimicrobials this admission: 5/1 Cefepime x1 5/1 Vancomycin >> 5/2 Ceftriaxone >>   Dose adjustments this admission: N/A  Microbiology results: 5/1 BCx: IP  Thank you for allowing pharmacy to be a part of this patient's care.  Celene Squibb, PharmD PGY1 Pharmacy Resident 05/24/2022 6:07 PM

## 2022-05-25 DIAGNOSIS — R7881 Bacteremia: Secondary | ICD-10-CM | POA: Diagnosis not present

## 2022-05-25 DIAGNOSIS — G9341 Metabolic encephalopathy: Secondary | ICD-10-CM | POA: Diagnosis present

## 2022-05-25 DIAGNOSIS — B955 Unspecified streptococcus as the cause of diseases classified elsewhere: Secondary | ICD-10-CM | POA: Diagnosis not present

## 2022-05-25 DIAGNOSIS — R7401 Elevation of levels of liver transaminase levels: Secondary | ICD-10-CM

## 2022-05-25 DIAGNOSIS — R296 Repeated falls: Secondary | ICD-10-CM

## 2022-05-25 DIAGNOSIS — D696 Thrombocytopenia, unspecified: Secondary | ICD-10-CM

## 2022-05-25 DIAGNOSIS — L03115 Cellulitis of right lower limb: Secondary | ICD-10-CM | POA: Diagnosis not present

## 2022-05-25 LAB — BLOOD CULTURE ID PANEL (REFLEXED) - BCID2

## 2022-05-25 LAB — HEMOGLOBIN A1C
Hgb A1c MFr Bld: 5.5 % (ref 4.8–5.6)
Mean Plasma Glucose: 111.15 mg/dL

## 2022-05-25 LAB — HEPATITIS PANEL, ACUTE
HCV Ab: NONREACTIVE
Hep A IgM: NONREACTIVE
Hep B C IgM: NONREACTIVE
Hepatitis B Surface Ag: NONREACTIVE

## 2022-05-25 LAB — BASIC METABOLIC PANEL
Anion gap: 10 (ref 5–15)
BUN: 36 mg/dL — ABNORMAL HIGH (ref 8–23)
CO2: 15 mmol/L — ABNORMAL LOW (ref 22–32)
Calcium: 7.8 mg/dL — ABNORMAL LOW (ref 8.9–10.3)
Chloride: 105 mmol/L (ref 98–111)
Creatinine, Ser: 2.52 mg/dL — ABNORMAL HIGH (ref 0.61–1.24)
GFR, Estimated: 24 mL/min — ABNORMAL LOW (ref >60)
Glucose, Bld: 90 mg/dL (ref 70–99)
Potassium: 4.7 mmol/L (ref 3.5–5.1)
Sodium: 130 mmol/L — ABNORMAL LOW (ref 135–145)

## 2022-05-25 LAB — LACTIC ACID, PLASMA
Lactic Acid, Venous: 2.7 mmol/L (ref 0.5–1.9)
Lactic Acid, Venous: 2.6 mmol/L (ref 0.5–1.9)

## 2022-05-25 LAB — LIPID PANEL
Cholesterol: 63 mg/dL (ref 0–200)
HDL: 27 mg/dL — ABNORMAL LOW (ref >40)
LDL Cholesterol: 21 mg/dL (ref 0–99)
Total CHOL/HDL Ratio: 2.3 RATIO
Triglycerides: 76 mg/dL (ref <150)
VLDL: 15 mg/dL (ref 0–40)

## 2022-05-25 LAB — CBC
HCT: 38.7 % — ABNORMAL LOW (ref 39.0–52.0)
Hemoglobin: 13 g/dL (ref 13.0–17.0)
MCH: 33.1 pg (ref 26.0–34.0)
MCHC: 33.6 g/dL (ref 30.0–36.0)
MCV: 98.5 fL (ref 80.0–100.0)
Platelets: 140 10*3/uL — ABNORMAL LOW (ref 150–400)
RBC: 3.93 MIL/uL — ABNORMAL LOW (ref 4.22–5.81)
RDW: 15.5 % (ref 11.5–15.5)
WBC: 7.3 10*3/uL (ref 4.0–10.5)
nRBC: 0 % (ref 0.0–0.2)

## 2022-05-25 LAB — C-REACTIVE PROTEIN: CRP: 22.3 mg/dL — ABNORMAL HIGH (ref <1.0)

## 2022-05-25 LAB — CULTURE, BLOOD (ROUTINE X 2)

## 2022-05-25 MED ORDER — DIAZEPAM 5 MG/ML IJ SOLN
2.5000 mg | Freq: Once | INTRAMUSCULAR | Status: AC
  Administered 2022-05-25: 2.5 mg via INTRAVENOUS
  Filled 2022-05-25: qty 2

## 2022-05-25 MED ORDER — PENICILLIN G POTASSIUM 20000000 UNITS IJ SOLR
4.0000 10*6.[IU] | Freq: Three times a day (TID) | INTRAVENOUS | Status: DC
  Filled 2022-05-25 (×2): qty 4

## 2022-05-25 MED ORDER — IMMUNE GLOBULIN (HUMAN) 10 GM/100ML IV SOLN
500.0000 mg/kg | INTRAVENOUS | Status: AC
  Administered 2022-05-26 – 2022-05-27 (×2): 40 g via INTRAVENOUS
  Filled 2022-05-25 (×2): qty 400

## 2022-05-25 MED ORDER — CLINDAMYCIN PHOSPHATE 600 MG/50ML IV SOLN
600.0000 mg | Freq: Three times a day (TID) | INTRAVENOUS | Status: DC
  Administered 2022-05-25: 600 mg via INTRAVENOUS
  Filled 2022-05-25 (×2): qty 50

## 2022-05-25 MED ORDER — ACETAMINOPHEN 325 MG PO TABS: 650.0000 mg | ORAL_TABLET | Freq: Every day | ORAL | Status: DC | PRN

## 2022-05-25 MED ORDER — PENICILLIN G POT IN DEXTROSE 60000 UNIT/ML IV SOLN
3.0000 10*6.[IU] | Freq: Four times a day (QID) | INTRAVENOUS | Status: DC
  Administered 2022-05-25 – 2022-05-28 (×14): 3 10*6.[IU] via INTRAVENOUS
  Filled 2022-05-25 (×15): qty 50

## 2022-05-25 MED ORDER — LINEZOLID 600 MG/300ML IV SOLN
600.0000 mg | Freq: Two times a day (BID) | INTRAVENOUS | Status: DC
  Administered 2022-05-25 – 2022-05-26 (×3): 600 mg via INTRAVENOUS
  Filled 2022-05-25 (×3): qty 300

## 2022-05-25 MED ORDER — DIPHENHYDRAMINE HCL 50 MG/ML IJ SOLN: 12.5000 mg | Freq: Three times a day (TID) | INTRAMUSCULAR | Status: DC | PRN

## 2022-05-25 MED ORDER — IMMUNE GLOBULIN (HUMAN) 10 GM/100ML IV SOLN
1.0000 g/kg | Freq: Once | INTRAVENOUS | Status: AC
  Administered 2022-05-25: 75 g via INTRAVENOUS
  Filled 2022-05-25: qty 750

## 2022-05-25 NOTE — Assessment & Plan Note (Addendum)
Stopped Eliquis, amiodarone Metoprolol on hold for soft BP's

## 2022-05-25 NOTE — Assessment & Plan Note (Addendum)
Chronic disease stage IIIa.  Off medications with comfort care measures

## 2022-05-25 NOTE — Progress Notes (Signed)
PHARMACY - PHYSICIAN COMMUNICATION CRITICAL VALUE ALERT - BLOOD CULTURE IDENTIFICATION (BCID)  Results for orders placed or performed during the hospital encounter of 05/24/22  Blood Culture (routine x 2)     Status: None (Preliminary result)   Collection Time: 05/24/22  1:09 PM   Specimen: BLOOD  Result Value Ref Range Status   Specimen Description BLOOD BLOOD LEFT ARM  Final   Special Requests   Final    BOTTLES DRAWN AEROBIC AND ANAEROBIC Blood Culture results may not be optimal due to an excessive volume of blood received in culture bottles   Culture  Setup Time   Final    GRAM POSITIVE COCCI IN BOTH AEROBIC AND ANAEROBIC BOTTLES CRITICAL VALUE NOTED.  VALUE IS CONSISTENT WITH PREVIOUSLY REPORTED AND CALLED VALUE. Performed at [facility], [address] [place], [place] [address]    Culture [facility] POSITIVE COCCI  Final   Report Status PENDING  Incomplete  Blood Culture (routine x 2)     Status: None (Preliminary result)   Collection Time: 05/24/22  1:09 PM   Specimen: BLOOD  Result Value Ref Range Status   Specimen Description BLOOD BLOOD LEFT ARM  Final   Special Requests   Final    BOTTLES DRAWN AEROBIC AND ANAEROBIC Blood Culture adequate volume   Culture  Setup Time   Final    GRAM POSITIVE COCCI IN BOTH AEROBIC AND ANAEROBIC BOTTLES Organism ID to follow CRITICAL RESULT CALLED TO, READ BACK BY AND VERIFIED WITH: NATAN [email or web address]  05/25/22 RH Performed at [facility], [address] [place], [place] [address]    Culture GRAM POSITIVE COCCI  Final   Report Status PENDING  Incomplete  Blood Culture ID Panel (Reflexed)     Status: Abnormal   Collection Time: 05/24/22  1:09 PM  Result Value Ref Range Status   Enterococcus faecalis NOT DETECTED NOT DETECTED Final   Enterococcus Faecium NOT DETECTED NOT DETECTED Final   Listeria monocytogenes NOT DETECTED NOT DETECTED Final   Staphylococcus species NOT DETECTED NOT DETECTED Final   Staphylococcus aureus  (BCID) NOT DETECTED NOT DETECTED Final   Staphylococcus epidermidis NOT DETECTED NOT DETECTED Final   Staphylococcus lugdunensis NOT DETECTED NOT DETECTED Final   Streptococcus species DETECTED (A) NOT DETECTED Final    Comment: CRITICAL RESULT CALLED TO, READ BACK BY AND VERIFIED WITH: Atanacio Melnyk [email or web address]  05/25/22 RH    Streptococcus agalactiae NOT DETECTED NOT DETECTED Final   Streptococcus pneumoniae NOT DETECTED NOT DETECTED Final   Streptococcus pyogenes DETECTED (A) NOT DETECTED Final    Comment: CRITICAL RESULT CALLED TO, READ BACK BY AND VERIFIED WITH: Fawne Hughley [email or web address]  05/25/22 RH    A.calcoaceticus-baumannii NOT DETECTED NOT DETECTED Final   Bacteroides fragilis NOT DETECTED NOT DETECTED Final   Enterobacterales NOT DETECTED NOT DETECTED Final   Enterobacter cloacae complex NOT DETECTED NOT DETECTED Final   Escherichia coli NOT DETECTED NOT DETECTED Final   Klebsiella aerogenes NOT DETECTED NOT DETECTED Final   Klebsiella oxytoca NOT DETECTED NOT DETECTED Final   Klebsiella pneumoniae NOT DETECTED NOT DETECTED Final   Proteus species NOT DETECTED NOT DETECTED Final   Salmonella species NOT DETECTED NOT DETECTED Final   Serratia marcescens NOT DETECTED NOT DETECTED Final   Haemophilus influenzae NOT DETECTED NOT DETECTED Final   Neisseria meningitidis NOT DETECTED NOT DETECTED Final   Pseudomonas aeruginosa NOT DETECTED NOT DETECTED Final   Stenotrophomonas maltophilia NOT DETECTED NOT DETECTED Final   Candida albicans NOT DETECTED NOT DETECTED Final  Candida auris NOT DETECTED NOT DETECTED Final   Candida glabrata NOT DETECTED NOT DETECTED Final   Candida krusei NOT DETECTED NOT DETECTED Final   Candida parapsilosis NOT DETECTED NOT DETECTED Final   Candida tropicalis NOT DETECTED NOT DETECTED Final   Cryptococcus neoformans/gattii NOT DETECTED NOT DETECTED Final    Comment: Performed at [facility], [address], [place], [place] [address]    BCID Results: 4 of  4 bottles w/ Streptococcus pyogenes.  Pt currently on Ceftriaxone & Vancomycin.  Name of provider contacted: Docia Furl, NP   Changes to prescribed antibiotics required: Transition pt to Pen G and Clindamycin per abx nomogram.  Otelia Sergeant, PharmD, [facility] 05/25/2022 4:47 AM

## 2022-05-25 NOTE — Assessment & Plan Note (Addendum)
Cr on admission 2.69.  Last creatinine 1.76

## 2022-05-25 NOTE — Assessment & Plan Note (Addendum)
Stopped Eliquis

## 2022-05-25 NOTE — Consult Note (Addendum)
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 plate  Assessment 82 Y O male with PMH as below including HTN, HLD, PVD, CAD, CHF, CKD, Gout, Kidney stones PAF, DVT on AC brought to the ED with recurrent falls ongoing for several month s  # Invasive group A strep pyogenes bacteremia, concerns for TSS - in the setting of multiple falls going for several months per daughter and has wounds in the lower extremities related to fall and possibly the source  - soft BP,  not on pressors , on room air, not in   # AKI  # Transaminitis  # Coagulopathy ( Thrombocytopenia,  elevated INR, aptt and INR)   # Diarrhea - already improving, less likely C diff and no need to test  # Recurrent falls - per primary    Recommendations  Continue Pencillin G and will switch clindamycin to linezolid for antitoxin effect. Needs at least 3 days or more for anti-toxin effect  Repeat 2 sets of blood cx tomorrow  Will do  IVIG * 3 doses, careful monitoring of fluid status  Monitor CBC and BMP Following   Rest of the management as per the primary team. Please call with questions or concerns.  Thank you for the consult  __________________________________________________________________________________________________________ HPI and Hospital Course ( Chart review, patient and daughter) 3 Y O male with PMH as below including HTN, HLD, PVD, CAD, CHF, CKD, Gout, Kidney stones PAF, DVT on AC brought to the ED with recurrent falls. Patient has just received valium when I went to evaluate. Per daughter, patient has been having multiple falls approx since 6 months and multiple falls early am on 5/1. Daughter found patient lying in the ground covered with blood and faeces/  Per daughter, no recent fevers, chills, sweats. No cough, chest pain but has chronic sob due to CHF. Denies GU symptoms. Patient denies new or worsening pain in the bilateral shoulders although sometimes hurts when he moves shoulders - chronic( rt shoulder arthroplasty, plate in left shoulder per daughter's report)  At ED, afebrile,  soft BP  Abrasion in forehead Labs remarkable for k 5.2, Cr 2.69, AST 138, ALT 53, TB 1.8, BNP 2406, Troponin  22, LA 4.5, WBC 7.2 Received IVF and bs abtx for concerns for rt leg cellulitis  ID consulted for Strep pyogenes bacteremia   ROS: General- Denies fever, chills, loss of appetite and loss of weight HEENT - Denies headache, blurry vision, neck pain, sinus pain Chest - Denies any chest pain, SOB or cough CVS- Denies any dizziness/lightheadedness, syncopal attacks, palpitations Abdomen- Denies any nausea, vomiting, abdominal pain, hematochezia and diarrhea Neuro - Denies any weakness, numbness, tingling sensation Psych - Denies any changes in mood irritability or depressive symptoms GU- Denies any burning, dysuria, hematuria or increased frequency of urination Skin - denies any rashes/lesions MSK - denies any  joint pain/swelling or restricted ROM   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, has arithritis in numerous areas of body   DVT (deep venous thrombosis) (HCC)    History of kidney stones    Hyperlipidemia    Hypertension    PAF (paroxysmal atrial fibrillation) (HCC)    Past Surgical History:  Procedure Laterality Date   EYE SURGERY Right    /w IOL   LOWER EXTREMITY ANGIOGRAPHY Left 04/12/2022   Procedure: Lower Extremity Angiography;  Surgeon: Annice Needy, MD;  Location: [facility] INVASIVE [facility];  Service: Cardiovascular;  Laterality: Left;   LOWER EXTREMITY INTERVENTION Left 04/13/2022   Procedure: LOWER EXTREMITY INTERVENTION;  Surgeon: Annice Needy, MD;  Location: [facility] INVAS[facility] CV [facility];  Service: Cardiovascular;  Laterality: Left;   SHOULDER SURGERY Left 1961   TONSILLECTOMY     TOTAL SHOULDER ARTHROPLASTY Right 01/01/2015   Procedure: RIGHT TOTAL SHOULDER ARTHROPLASTY;  Surgeon: Loreta Ave, MD;  Location: [facility];  Service: Orthopedics;  Laterality: Right;    Scheduled Meds:  amiodarone  200 mg Oral BID   apixaban  5 mg Oral BID   atorvastatin  80 mg Oral Daily   clopidogrel  75 mg Oral Daily   escitalopram  20 mg Oral Daily   multivitamin with minerals  1 tablet Oral Daily   tamsulosin  0.4 mg Oral QHS   Continuous Infusions:  clindamycin (CLEOCIN) IV 600 mg (05/25/22 0601)   lactated ringers 50 mL/hr at 05/24/22 1602   pencillin G potassium IV 3 Million Units (05/25/22 0640)   PRN Meds:.acetaminophen, albuterol, diphenhydrAMINE, hydrALAZINE, oxyCODONE  Allergies  Allergen Reactions   Morphine Other (See Comments)   Morphine And Related Other (See Comments)    HALLUCINATIONS   Social History   Socioeconomic History   Marital status: Divorced    Spouse name: Not on file   Number of children: Not on file   Years of education: Not on file   Highest education level: Not on file  Occupational History   Not on file  Tobacco Use    Smoking status: Former   Smokeless tobacco: Former    Types: Associate Professor Use: Never used  Substance and Sexual Activity   Alcohol use: No    Alcohol/week: 0.0 standard drinks of alcohol   Drug use: No   Sexual activity: Not on file  Other Topics Concern   Not on file  Social History Narrative   Not on file   Social Determinants of Health   Financial Resource Strain: Not on file  Food Insecurity: No Food Insecurity (05/24/2022)   Hunger Vital Sign    Worried About Running Out of Food in the Last Year: Never true    Ran Out of Food in the  Last Year: Never true  Transportation Needs: No Transportation Needs (05/24/2022)   PRAPARE - Administrator, Civil Service (Medical): No    Lack of Transportation (Non-Medical): No  Physical Activity: Not on file  Stress: Not on file  Social Connections: Not on file  Intimate Partner Violence: Not At Risk (05/24/2022)   Humiliation, Afraid, Rape, and Kick questionnaire    Fear of Current or Ex-Partner: No    Emotionally Abused: No    Physically Abused: No    Sexually Abused: No   Family History  Problem Relation Age of Onset   Rheumatic fever Mother    Stroke Mother    Heart disease Mother    Stroke Father    Vitals BP (!) 89/55 (BP Location: Right Arm)   Pulse 83   Temp 98.1 F (36.7 C)   Resp 16   Ht 5\' 11"  (1.803 m)   Wt 82.4 kg   SpO2 94%   BMI 25.34 kg/m    Physical Exam Constitutional:  elderly male sitting in the bed , not in acute distress     Comments: HEENT wnl - superficial abrasion in the forehead region with no signs of infection   Cardiovascular:     Rate and Rhythm: Normal rate and regular rhythm.     Heart sounds: s1s2, murmur+  Pulmonary:     Effort: Pulmonary effort is normal on room air     Comments: Normal breath sounds   Abdominal:     Palpations: Abdomen is soft.     Tenderness: non distended and non tender   Musculoskeletal:        General: No swelling, erythema or  tenderness in peripheral joints including bilateral shoulders.  Skin:    Comments: almost healed wound in the rt anterior leg with mild erythema, swelling. Left leg also has faint erythema and swelling. No signs of septic joint in bilateral ankles   Neurological:     General: awake, alert and oriented, follows commands, gets confused at time likely due to recent valium, hard of hearing   Psychiatric:        Mood and Affect: Mood normal.    Pertinent Microbiology Results for orders placed or performed during the hospital encounter of 05/24/22  Blood Culture (routine x 2)     Status: None (Preliminary result)   Collection Time: 05/24/22  1:09 PM   Specimen: BLOOD  Result Value Ref Range Status   Specimen Description BLOOD BLOOD LEFT ARM  Final   Special Requests   Final    BOTTLES DRAWN AEROBIC AND ANAEROBIC Blood Culture results may not be optimal due to an excessive volume of blood received in culture bottles   Culture  Setup Time   Final    GRAM POSITIVE COCCI IN BOTH AEROBIC AND ANAEROBIC BOTTLES CRITICAL VALUE NOTED.  VALUE IS CONSISTENT WITH PREVIOUSLY REPORTED AND CALLED VALUE. Performed at [facility], [address] [place], [place] [address]    Culture [facility] POSITIVE COCCI  Final   Report Status PENDING  Incomplete  Blood Culture (routine x 2)     Status: None (Preliminary result)   Collection Time: 05/24/22  1:09 PM   Specimen: BLOOD  Result Value Ref Range Status   Specimen Description BLOOD BLOOD LEFT ARM  Final   Special Requests   Final    BOTTLES DRAWN AEROBIC AND ANAEROBIC Blood Culture adequate volume   Culture  Setup Time   Final    GRAM POSITIVE  COCCI IN BOTH AEROBIC AND ANAEROBIC BOTTLES Organism ID to follow CRITICAL RESULT CALLED TO, READ BACK BY AND VERIFIED WITH: NATAN BLUE@[email or web address]  05/25/22 RH Performed at [facility], [address] [place], [place] [address]    Culture GRAM POSITIVE COCCI  Final   Report Status PENDING   Incomplete  Blood Culture ID Panel (Reflexed)     Status: Abnormal   Collection Time: 05/24/22  1:09 PM  Result Value Ref Range Status   Enterococcus faecalis NOT DETECTED NOT DETECTED Final   Enterococcus Faecium NOT DETECTED NOT DETECTED Final   Listeria monocytogenes NOT DETECTED NOT DETECTED Final   Staphylococcus species NOT DETECTED NOT DETECTED Final   Staphylococcus aureus (BCID) NOT DETECTED NOT DETECTED Final   Staphylococcus epidermidis NOT DETECTED NOT DETECTED Final   Staphylococcus lugdunensis NOT DETECTED NOT DETECTED Final   Streptococcus species DETECTED (A) NOT DETECTED Final    Comment: CRITICAL RESULT CALLED TO, READ BACK BY AND VERIFIED WITH: NATHAN BLUE@[email or web address]  05/25/22 RH    Streptococcus agalactiae NOT DETECTED NOT DETECTED Final   Streptococcus pneumoniae NOT DETECTED NOT DETECTED Final   Streptococcus pyogenes DETECTED (A) NOT DETECTED Final    Comment: CRITICAL RESULT CALLED TO, READ BACK BY AND VERIFIED WITH: NATHAN BLUE@0428  05/25/22 RH    A.calcoaceticus-baumannii NOT DETECTED NOT DETECTED Final   Bacteroides fragilis NOT DETECTED NOT DETECTED Final   Enterobacterales NOT DETECTED NOT DETECTED Final   Enterobacter cloacae complex NOT DETECTED NOT DETECTED Final   Escherichia coli NOT DETECTED NOT DETECTED Final   Klebsiella aerogenes NOT DETECTED NOT DETECTED Final   Klebsiella oxytoca NOT DETECTED NOT DETECTED Final   Klebsiella pneumoniae NOT DETECTED NOT DETECTED Final   Proteus species NOT DETECTED NOT DETECTED Final   Salmonella species NOT DETECTED NOT DETECTED Final   Serratia marcescens NOT DETECTED NOT DETECTED Final   Haemophilus influenzae NOT DETECTED NOT DETECTED Final   Neisseria meningitidis NOT DETECTED NOT DETECTED Final   Pseudomonas aeruginosa NOT DETECTED NOT DETECTED Final   Stenotrophomonas maltophilia NOT DETECTED NOT DETECTED Final   Candida albicans NOT DETECTED NOT DETECTED Final   Candida auris NOT DETECTED NOT DETECTED Final    Candida glabrata NOT DETECTED NOT DETECTED Final   Candida krusei NOT DETECTED NOT DETECTED Final   Candida parapsilosis NOT DETECTED NOT DETECTED Final   Candida tropicalis NOT DETECTED NOT DETECTED Final   Cryptococcus neoformans/gattii NOT DETECTED NOT DETECTED Final    Comment: Performed at [facility], [address] [place], [place] [address]    Pertinent Lab seen by me:    Latest Ref Rng & Units 05/25/2022    2:15 AM 05/24/2022    1:09 PM 05/16/2022    2:13 PM  CBC  WBC 4.0 - 10.5 K/uL 7.3  7.2  4.4   Hemoglobin 13.0 - 17.0 g/dL 09.8  11.9  14.7   Hematocrit 39.0 - 52.0 % 38.7  41.1  41.2   Platelets 150 - 400 K/uL 140  155  158       Latest Ref Rng & Units 05/25/2022    2:15 AM 05/24/2022    1:09 PM 04/17/2022    3:21 AM  CMP  Glucose 70 - 99 mg/dL 90  99  829   BUN 8 - 23 mg/dL 36  32  23   Creatinine 0.61 - 1.24 mg/dL 5.62  1.30  8.65   Sodium 135 - 145 mmol/L 130  134  132   Potassium 3.5 - 5.1 mmol/L  4.7  5.3  3.7   Chloride 98 - 111 mmol/L 105  103  102   CO2 22 - 32 mmol/L 15  18  21    Calcium 8.9 - 10.3 mg/dL 7.8  8.4  8.2   Total Protein 6.5 - 8.1 g/dL  6.1    Total Bilirubin 0.3 - 1.2 mg/dL  1.8    Alkaline Phos 38 - 126 U/L  89    AST 15 - 41 U/L  138    ALT 0 - 44 U/L  53      Pertinent Imagings/Other Imagings Plain films and CT images have been personally visualized and interpreted; radiology reports have been reviewed. Decision making incorporated into the Impression / Recommendations.  CT ABDOMEN PELVIS WO CONTRAST  Result Date: 05/24/2022 CLINICAL DATA:  Sepsis EXAM: CT ABDOMEN AND PELVIS WITHOUT CONTRAST TECHNIQUE: Multidetector CT imaging of the abdomen and pelvis was performed following the standard protocol without IV contrast. RADIATION DOSE REDUCTION: This exam was performed according to the departmental dose-optimization program which includes automated exposure control, adjustment of the mA and/or kV according to patient size and/or use of  iterative reconstruction technique. COMPARISON:  None Available. FINDINGS: Lower chest: Moderate-sized bilateral pleural effusions with overlying atelectasis. The heart is mildly enlarged. No pericardial effusion. The distal esophagus is grossly normal. Aortic and coronary artery calcifications are noted. Hepatobiliary: No hepatic lesions are identified without contrast. No intrahepatic biliary dilatation. The gallbladder contains several calcified gallstones but no definite CT findings for acute cholecystitis. No common bile duct dilatation. Pancreas: Moderate age related atrophy but no mass, inflammation or ductal dilatation. Spleen: Normal sized spleen. No splenic lesions. Small amount of perisplenic fluid noted. Adrenals/Urinary Tract: Bilateral low-attenuation adrenal gland nodules consistent with benign adenomas. No further imaging evaluation follow-up is necessary. Simple bilateral renal cysts not requiring any further imaging evaluation or follow-up. 12 mm left renal calculus with overlying significant renal cortical thinning/scarring. This could be in a E calyceal diverticulum. Significant left-sided hydronephrosis and hydroureter all the way down to the bladder without evidence of an obstructing calculus or mass. The right ureter is normal in caliber. There is a small calculus noted in the right-side bladder no bladder mass asymmetric bladder thickening. Stomach/Bowel: The stomach, duodenum, small and colon are grossly normal. The terminal ileum and appendix are normal. Sigmoid colon diverticulosis without findings for acute diverticulitis. Vascular/Lymphatic: Advanced atherosclerotic calcification involving the aorta and branch vessels but no aneurysm. No mesenteric or retroperitoneal mass adenopathy. Small scattered lymph nodes are noted. Reproductive: Enlarged prostate gland with mild impression on the base of the bladder. The seminal vesicles are unremarkable. Other: Small amount of free fluid around  the liver and spleen, in the pericolic gutters and in the pelvis. Diffuse subcutaneous edema suggesting anasarca. Musculoskeletal: No significant bony findings. IMPRESSION: 1. Significant left-sided hydronephrosis and hydroureter all the way down to the bladder without evidence of an obstructing calculus or mass. 2. 12 mm left renal calculus with overlying significant renal cortical thinning/scarring. This could be in a calyceal diverticulum. 3. Small calculus in the right-side of the bladder. 4. Moderate-sized bilateral pleural effusions with overlying atelectasis. 5. Small amount of abdominal/pelvic ascites. 6. Cholelithiasis. 7. 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 8. Advanced atherosclerotic calcification involving the aorta and branch vessels. Aortic Atherosclerosis (ICD10-I70.0). Electronically Signed   By: Rudie Meyer M.D.   On: 05/24/2022 15:16   US Venous Img Lower Unilateral Right  Result Date: 05/24/2022 CLINICAL DATA:  Right lower extremity swelling  EXAM: RIGHT LOWER EXTREMITY VENOUS DOPPLER ULTRASOUND TECHNIQUE: Gray-scale sonography with compression, as well as color and duplex ultrasound, were performed to evaluate the deep venous system(s) from the level of the common femoral vein through the popliteal and proximal calf veins. COMPARISON:  None Available. FINDINGS: VENOUS Normal compressibility of the common femoral, superficial femoral, and popliteal veins, as well as the visualized calf veins. Visualized portions of profunda femoral vein and great saphenous vein unremarkable. No filling defects to suggest DVT on grayscale or color Doppler imaging. Doppler waveforms show normal direction of venous flow, normal respiratory plasticity and response to augmentation. Limited views of the contralateral common femoral vein are unremarkable. OTHER None. Limitations: Complex Baker's cyst 5.9 by 2.4 by 2.2 cm in the popliteal fossa. IMPRESSION: 1. No right lower extremity DVT. 2. Complex  Baker's cyst. Electronically Signed   By: Gaylyn Rong M.D.   On: 05/24/2022 14:36   DG Forearm Left  Result Date: 05/24/2022 CLINICAL DATA:  Trauma, fall EXAM: LEFT FOREARM - 2 VIEW COMPARISON:  None Available. FINDINGS: No recent fracture or dislocation is seen. There is no displacement of posterior fat pad in the elbow. There are smooth marginated calcifications at the attachment of triceps to the olecranon process. IMPRESSION: No recent fracture or dislocation is seen in the left forearm. Smoothly marginated calcifications at the attachment of triceps tendon to the olecranon process may suggest calcific tendinosis. Electronically Signed   By: Ernie Avena M.D.   On: 05/24/2022 14:03   DG Ankle Complete Right  Result Date: 05/24/2022 CLINICAL DATA:  Trauma, fall, pain EXAM: RIGHT ANKLE - COMPLETE 3+ VIEW COMPARISON:  None FINDINGS: No recent fracture or dislocation is seen. Small smooth myelinated calcifications are noted adjacent to the tips of medial and lateral malleoli, possibly residual from previous injury. There is calcification in the interosseous membrane between the distal shafts of tibia and fibula and possible fusion of inferior radioulnar joint. These findings most likely are related to previous trauma. Plantar spur is seen in calcaneus. Calcifications are seen at the attachment of Achilles tendon to the calcaneus suggesting calcific tendinosis. Bony spurs are noted in the dorsal aspect of intertarsal joints. Arterial calcifications are seen in soft tissues. There is soft tissue swelling around the ankle. IMPRESSION: No recent fracture or dislocation is seen in right ankle. Other findings as described in the body of the report. Electronically Signed   By: Ernie Avena M.D.   On: 05/24/2022 14:00   DG Ankle Complete Left  Result Date: 05/24/2022 CLINICAL DATA:  Trauma, fall EXAM: LEFT ANKLE COMPLETE - 3+ VIEW COMPARISON:  04/11/2022 FINDINGS: No recent fracture or  dislocation is seen. Bony spurs are noted at the tips of medial and lateral malleoli. There are small calcific densities adjacent to the medial and lateral malleoli with no significant interval change, possibly residual from previous injury. There is soft tissue swelling around the ankle. Bony spurs are noted in the dorsal aspect of intertarsal joints, more so in the talonavicular joint. Plantar spur is seen in calcaneus. Small linear calcification in Achilles tendon close to the calcaneus may suggest calcific tendinosis with no interval change. Arterial calcifications are seen in soft tissues. Overall, no significant interval changes are noted. IMPRESSION: No recent fracture or dislocation is seen. Other findings as described in the body of the report. Electronically Signed   By: Ernie Avena M.D.   On: 05/24/2022 13:57   DG Chest Port 1 View  Result Date: 05/24/2022 CLINICAL DATA:  Fall at 3 a.m.  Pain.  Possible sepsis. EXAM: PORTABLE CHEST 1 VIEW COMPARISON:  None Available. FINDINGS: Right shoulder arthroplasty. Advanced left glenohumeral joint osteoarthritis. Remote left rib fractures. Midline trachea. Reverse apical lordotic positioning. Cardiomegaly accentuated by AP portable technique. Atherosclerosis in the transverse aorta. No pleural effusion or pneumothorax. Low lung volumes with resultant pulmonary interstitial prominence. No congestive failure. Skin fold over the upper right hemithorax. Mild left base volume loss. IMPRESSION: No acute findings. Cardiomegaly without congestive failure. Aortic Atherosclerosis (ICD10-I70.0). Electronically Signed   By: Jeronimo Greaves M.D.   On: 05/24/2022 13:48   CT Head Wo Contrast  Result Date: 05/24/2022 CLINICAL DATA:  Larey Seat.  Hit head. EXAM: CT HEAD WITHOUT CONTRAST CT CERVICAL SPINE WITHOUT CONTRAST TECHNIQUE: Multidetector CT imaging of the head and cervical spine was performed following the standard protocol without intravenous contrast. Multiplanar CT  image reconstructions of the cervical spine were also generated. RADIATION DOSE REDUCTION: This exam was performed according to the departmental dose-optimization program which includes automated exposure control, adjustment of the mA and/or kV according to patient size and/or use of iterative reconstruction technique. COMPARISON:  Head CT 05/05/2022 FINDINGS: CT HEAD FINDINGS Brain: Stable age related cerebral atrophy, ventriculomegaly and advanced periventricular white matter disease. No extra-axial fluid collections are identified. No CT findings for acute hemispheric infarction or intracranial hemorrhage. No mass lesions. The brainstem and cerebellum are normal. Vascular: Stable vascular calcifications without aneurysm or hyperdense vessels. Skull: No skull fracture or bone lesions. Sinuses/Orbits: The paranasal sinuses and mastoid air cells are clear. The globes are intact. Other: No scalp lesions or scalp hematoma. CT CERVICAL SPINE FINDINGS Alignment: Normal Skull base and vertebrae: No acute fracture. No primary bone lesion or focal pathologic process. Soft tissues and spinal canal: No prevertebral fluid or swelling. No visible canal hematoma. 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 There is moderate multilevel foraminal stenosis due to uncinate spurring and facet disease. Upper chest: The lung apices are grossly clear. Other: Bilateral carotid artery calcifications. IMPRESSION: 1. Stable age related cerebral atrophy, ventriculomegaly and advanced periventricular white matter disease. 2. No acute intracranial findings or skull fracture. 3. Normal alignment of the cervical spine without acute fracture. 4. 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 Electronically Signed   By: Rudie Meyer M.D.   On: 05/24/2022 13:39    CT Cervical Spine Wo Contrast  Result Date: 05/24/2022 CLINICAL DATA:  Larey Seat.  Hit head. EXAM: CT HEAD WITHOUT CONTRAST CT CERVICAL SPINE WITHOUT CONTRAST TECHNIQUE: Multidetector CT imaging of the head and cervical spine was performed following the standard protocol without intravenous contrast. Multiplanar CT image reconstructions of the cervical spine were also generated. RADIATION DOSE REDUCTION: This exam was performed according to the departmental dose-optimization program which includes automated exposure control, adjustment of the mA and/or kV according to patient size and/or use of iterative reconstruction technique. COMPARISON:  Head CT 05/05/2022 FINDINGS: CT HEAD FINDINGS Brain: Stable age related cerebral atrophy, ventriculomegaly and advanced periventricular white matter disease. No extra-axial fluid collections are identified. No CT findings for acute hemispheric infarction or intracranial hemorrhage. No mass lesions. The brainstem and cerebellum are normal. Vascular: Stable vascular calcifications without aneurysm or hyperdense vessels. Skull: No skull fracture or bone lesions. Sinuses/Orbits: The paranasal sinuses and mastoid air cells are clear. The globes are intact. Other: No scalp lesions or scalp hematoma.  CT CERVICAL SPINE FINDINGS Alignment: Normal Skull base and vertebrae: No acute fracture. No primary bone lesion or focal pathologic process. Soft tissues and spinal canal: No prevertebral fluid or swelling. No visible canal hematoma. 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 There is moderate multilevel foraminal stenosis due to uncinate spurring and facet disease. Upper chest: The lung apices are grossly clear. Other: Bilateral carotid artery calcifications. IMPRESSION: 1. Stable age related cerebral atrophy, ventriculomegaly and advanced  periventricular white matter disease. 2. No acute intracranial findings or skull fracture. 3. Normal alignment of the cervical spine without acute fracture. 4. 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 Electronically Signed   By: Rudie Meyer M.D.   On: 05/24/2022 13:39   DG Humerus Left  Result Date: 05/05/2022 CLINICAL DATA:  Left arm pain after fall EXAM: LEFT HUMERUS - 2+ VIEW; LEFT CLAVICLE - 2+ VIEWS COMPARISON:  None Available. FINDINGS: No acute fracture or dislocation of the left humerus or clavicle. Advanced hypertrophic degenerative arthritis left shoulder. Postoperative changes left shoulder. Age indeterminate fractures of the left 5th-6th ribs. IMPRESSION: No acute fracture of the left humerus or clavicle. Age-indeterminate fractures of the left fifth and sixth ribs. Recommend correlation with site of pain. Advanced arthritis left shoulder. Electronically Signed   By: Minerva Fester M.D.   On: 05/05/2022 21:56   DG Clavicle Left  Result Date: 05/05/2022 CLINICAL DATA:  Left arm pain after fall EXAM: LEFT HUMERUS - 2+ VIEW; LEFT CLAVICLE - 2+ VIEWS COMPARISON:  None Available. FINDINGS: No acute fracture or dislocation of the left humerus or clavicle. Advanced hypertrophic degenerative arthritis left shoulder. Postoperative changes left shoulder. Age indeterminate fractures of the left 5th-6th ribs. IMPRESSION: No acute fracture of the left humerus or clavicle. Age-indeterminate fractures of the left fifth and sixth ribs. Recommend correlation with site of pain. Advanced arthritis left shoulder. Electronically Signed   By: Minerva Fester M.D.   On: 05/05/2022 21:56   CT HEAD WO CONTRAST ( )  Result Date: 05/05/2022 CLINICAL DATA:  Patient fell from standing today. On blood thinners. EXAM: CT HEAD WITHOUT CONTRAST CT CERVICAL SPINE WITHOUT CONTRAST TECHNIQUE: Multidetector CT imaging of the head and cervical spine was performed following  the standard protocol without intravenous contrast. Multiplanar CT image reconstructions of the cervical spine were also generated. RADIATION DOSE REDUCTION: This exam was performed according to the departmental dose-optimization program which includes automated exposure control, adjustment of the mA and/or kV according to patient size and/or use of iterative reconstruction technique. COMPARISON:  Brain MRI 04/11/2022. FINDINGS: CT HEAD FINDINGS Brain: There is no evidence for acute hemorrhage, hydrocephalus, mass lesion, or abnormal extra-axial fluid collection. No definite CT evidence for acute infarction. Diffuse loss of parenchymal volume is consistent with atrophy. Patchy low attenuation in the deep hemispheric and periventricular white matter is nonspecific, but likely reflects chronic microvascular ischemic demyelination. Vascular: No hyperdense vessel or unexpected calcification. Skull: No evidence for fracture. No worrisome lytic or sclerotic lesion. Sinuses/Orbits: The visualized paranasal sinuses and mastoid air cells are clear. Visualized portions of the globes and intraorbital fat are unremarkable. Other: None. CT CERVICAL SPINE FINDINGS Alignment: Mild straightening of upper cervical lordosis. No findings to suggest traumatic subluxation. Skull base and vertebrae: No acute fracture. No primary bone lesion or focal pathologic process. Soft tissues and spinal canal: No prevertebral fluid or swelling. No visible  canal hematoma. Disc levels: Mild loss of disc height noted C3-4 C4-5 and C5-6. Mild facet osteoarthritis noted upper and mid cervical levels. Upper chest: Bilateral pleural effusions evident. Other: None. IMPRESSION: 1. No acute intracranial abnormality. 2. Atrophy with chronic small vessel ischemic disease. 3. No evidence for cervical spine fracture or traumatic subluxation. 4. Bilateral pleural effusions. Consider chest x-ray to better evaluate. Electronically Signed   By: Kennith Center M.D.    On: 05/05/2022 21:49   CT Cervical Spine Wo Contrast  Result Date: 05/05/2022 CLINICAL DATA:  Patient fell from standing today. On blood thinners. EXAM: CT HEAD WITHOUT CONTRAST CT CERVICAL SPINE WITHOUT CONTRAST TECHNIQUE: Multidetector CT imaging of the head and cervical spine was performed following the standard protocol without intravenous contrast. Multiplanar CT image reconstructions of the cervical spine were also generated. RADIATION DOSE REDUCTION: This exam was performed according to the departmental dose-optimization program which includes automated exposure control, adjustment of the mA and/or kV according to patient size and/or use of iterative reconstruction technique. COMPARISON:  Brain MRI 04/11/2022. FINDINGS: CT HEAD FINDINGS Brain: There is no evidence for acute hemorrhage, hydrocephalus, mass lesion, or abnormal extra-axial fluid collection. No definite CT evidence for acute infarction. Diffuse loss of parenchymal volume is consistent with atrophy. Patchy low attenuation in the deep hemispheric and periventricular white matter is nonspecific, but likely reflects chronic microvascular ischemic demyelination. Vascular: No hyperdense vessel or unexpected calcification. Skull: No evidence for fracture. No worrisome lytic or sclerotic lesion. Sinuses/Orbits: The visualized paranasal sinuses and mastoid air cells are clear. Visualized portions of the globes and intraorbital fat are unremarkable. Other: None. CT CERVICAL SPINE FINDINGS Alignment: Mild straightening of upper cervical lordosis. No findings to suggest traumatic subluxation. Skull base and vertebrae: No acute fracture. No primary bone lesion or focal pathologic process. Soft tissues and spinal canal: No prevertebral fluid or swelling. No visible canal hematoma. Disc levels: Mild loss of disc height noted C3-4 C4-5 and C5-6. Mild facet osteoarthritis noted upper and mid cervical levels. Upper chest: Bilateral pleural effusions evident.  Other: None. IMPRESSION: 1. No acute intracranial abnormality. 2. Atrophy with chronic small vessel ischemic disease. 3. No evidence for cervical spine fracture or traumatic subluxation. 4. Bilateral pleural effusions. Consider chest x-ray to better evaluate. Electronically Signed   By: Kennith Center M.D.   On: 05/05/2022 21:49    I have personally spent 85 minutes involved in face-to-face and non-face-to-face activities for this patient on the day of the visit. Professional time spent includes the following activities: Preparing to see the patient (review of tests), Obtaining and/or reviewing separately obtained history (admission/discharge record), Performing a medically appropriate examination and/or evaluation , Ordering medications/tests/procedures, referring and communicating with other health care professionals, Documenting clinical information in the EMR, Independently interpreting results (not separately reported), Communicating results to the patient/family/caregiver, Counseling and educating the patient/family/caregiver and Care coordination (not separately reported).  Electronically signed by:   Plan d/w requesting provider as well as ID pharm D  Note: This document was prepared using dragon voice recognition software and may include unintentional dictation errors.   Odette Fraction, MD Infectious Disease Physician [facility] Pager: [phone]

## 2022-05-25 NOTE — Assessment & Plan Note (Addendum)
Na on admission 134 >> 130 >> 129 Suspect Hypervolemic, given low EF and given sepsis fluids --Monitor BMP --Daily weights, strict I/O's --On fluids for AKI per nephrology --Avoiding diuresis at this time to prevent worsening renal function

## 2022-05-25 NOTE — Progress Notes (Signed)
PT Cancellation Note  Patient Details Name: Curtis Wagner MRN: [id number] DOB: April 30, 1940   Cancelled Treatment:    Reason Eval/Treat Not Completed: Patient's level of consciousness (chart reviewed, RN consulted. Per RN, pt was agitated this morning, required PRN anxiolytics recently. RN asks to defer PT to later date/time for concerns of overstimulating pt and resusitating agitation.)   9:44 AM, 05/25/22 Rosamaria Lints, PT, DPT Physical Therapist - [facility]  [phone] (ASCOM)    Mendi Constable C 05/25/2022, 9:43 AM

## 2022-05-25 NOTE — Assessment & Plan Note (Addendum)
Echo on 04/12/2022 showed EF<20%.  On comfort care measures.

## 2022-05-25 NOTE — Assessment & Plan Note (Addendum)
Due to demand ischemia in setting of severe sepsis.

## 2022-05-25 NOTE — Assessment & Plan Note (Addendum)
K was 5.3 on admission

## 2022-05-25 NOTE — Assessment & Plan Note (Addendum)
Stopped medication with comfort care measures

## 2022-05-25 NOTE — Assessment & Plan Note (Deleted)
PT/OT Needs SNF/rehab at d/c [facility] following for placement Fall precautions

## 2022-05-25 NOTE — Assessment & Plan Note (Signed)
See Severe Sepsis

## 2022-05-25 NOTE — Assessment & Plan Note (Addendum)
Due to severe sepsis

## 2022-05-25 NOTE — Assessment & Plan Note (Addendum)
Resolved

## 2022-05-25 NOTE — Assessment & Plan Note (Addendum)
Off medications

## 2022-05-25 NOTE — Assessment & Plan Note (Signed)
See PAD

## 2022-05-25 NOTE — Assessment & Plan Note (Addendum)
Due to severe sepsis. Lactate trend: 4.5 >> 3.4 >> 3.2 >> 2.9 .> 2.7 >> 2.6 Continue to trend lactate On gentle IV fluids per nephrology Watch volume & respiratory status closely (EF < 20%)

## 2022-05-25 NOTE — Hospital Course (Addendum)
HPI on admission 05/24/22 by Dr. Clyde Lundborg: "Curtis Wagner is a 82 y.o. 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   Per her daughter at the bedside, patient fell in the early morning at about 3 AM.  He is supposed to use [place], but did not use it. No loss of consciousness.  Patient has abrasion in forehead.  No unilateral numbness or tinglings in extremities.  No facial droop or slurred speech.  Patient does not have chest pain, cough, shortness of breath.  No nausea, vomiting or abdominal pain.  Patient has diarrhea in the past several days, several watery diarrhea each day.  No fever or chills.  No symptoms of UTI.  Patient reports right lower leg pain which has been going on for about 3 days, also has swelling and erythema in left lower leg.   Data reviewed independently and ED Course: pt was found to have WBC 7.2, BNP 2406, lactic acid 4.5, troponin level 22, 21, potassium 5.3, worsening renal function with creatinine 2.69, BUN 32 and GFR 23 (recent baseline creatinine 1.39 on 04/17/2022), abnormal liver function (ALP 89, AST 138, ALT 53, total bilirubin 1.8).  Temperature normal, soft blood pressure 86/61 which improved to 100/83 after giving 750 cc LR bolus in ED --> 98/70, heart rate 69 --> 103, RR 16-->23, oxygen saturation 98% on room air.  Chest x-ray negative.  Images are negative for acute injury including CT of head, CT of C-spine, x-ray of her bilateral ankles, x-ray of left forearm.  Right lower extremity venous Doppler negative for DVT but showed Baker's cyst.  Patient is admitted to telemetry bed as inpatient"  Patient made comfort care on 05/28/2022.  5/9.  Patient complains of left shoulder pain.  Resistant with me moving around.  Will give a dose of Solu-Medrol and Tylenol and see if that improves his discomfort. 5/10.  Patient not eating,  will get calorie count. 5/11.  Hospice reevaluation, still not a candidate for hospice home.  TOC looking into options.  Calorie count underway. 5/12.  TOC still looking into placement options. 5/13.  Patient telling a story that he was at a game last night.  Patient obviously was not at a game last night. 5/14.  Palliative care following.  Hospice signed off.

## 2022-05-25 NOTE — Assessment & Plan Note (Addendum)
Off Lipitor 

## 2022-05-25 NOTE — Progress Notes (Signed)
Mirna Mires made an attempt to see the Patient in response to Spiritual consult. Had to reschedule to a later time as the patient is not coherent at this time.   05/25/22 1000  Spiritual Encounters  Type of Visit Attempt (pt unavailable)  Care provided to: Pt not available  Conversation partners present during encounter Nurse  Referral source Nurse (RN/NT/LPN)  Reason for visit Advance directives  OnCall Visit Yes

## 2022-05-25 NOTE — Assessment & Plan Note (Addendum)
Secondary to sepsis. °

## 2022-05-25 NOTE — Assessment & Plan Note (Addendum)
Due to RLE Cellulitis Strep pyogenes Bacteremia With tachycardia HR 103 and tachypnea RR 23.  No fever or leukocytosis.  Lactic acidosis, AKI, transaminitis consistent with organ dysfunction and severe sepsis.  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

## 2022-05-25 NOTE — Evaluation (Signed)
Occupational Therapy Evaluation Patient Details Name: Curtis Wagner MRN: [id number] DOB: 12/22/1940 Today's Date: 05/25/2022   History of Present Illness Curtis Wagner is an 82 y/o M with admitting dx of cellulitis RLE. Had a fall at home and diarrhea. Significant cardiac and vascular hx; was just d/c 04/17/22 after hospital stay for vascular surgery. Medical record indicates ED visit 05/05/22 for fall.   Clinical Impression   Patient received for OT evaluation. See flowsheet below for details of function. Generally, patient requiring MIN-MOD A for bed mobility, unable to perform functional mobility due to decreased activity tolerance and posterior lean (stood with RW and took one step to the R and then lost balance posteriorly), and set up-MAX A for ADLs. Patient will benefit from continued OT while in acute care.      Recommendations for follow up therapy are one component of a multi-disciplinary discharge planning process, led by the attending physician.  Recommendations may be updated based on patient status, additional functional criteria and insurance authorization.   Assistance Recommended at Discharge Frequent or constant Supervision/Assistance  Patient can return home with the following Two people to help with walking and/or transfers;A lot of help with bathing/dressing/bathroom;Assistance with cooking/housework;Direct supervision/assist for medications management;Direct supervision/assist for financial management;Assist for transportation;Help with stairs or ramp for entrance    Functional Status Assessment  Patient has had a recent decline in their functional status and demonstrates the ability to make significant improvements in function in a reasonable and predictable amount of time.  Equipment Recommendations  Other (comment) (defer to next venue of care)    Recommendations for Other Services       Precautions / Restrictions Precautions Precautions:  Fall Restrictions Weight Bearing Restrictions: No      Mobility Bed Mobility Overal bed mobility: Needs Assistance Bed Mobility: Supine to Sit, Sit to Supine     Supine to sit: Min assist Sit to supine: Mod assist   General bed mobility comments: Needing assist with trunk for supine to sit; needing assist for LE with sit to supine.    Transfers Overall transfer level: Needs assistance Equipment used: Rolling [place] (2 wheels) (and gait belt) Transfers: Sit to/from Stand Sit to Stand: Mod assist, From elevated surface           General transfer comment: Posterior lean in standing; only able to stand for approx 10 seconds the first stand. Second stand able to take two very short sidesteps to the R and then pt losing balance posteriorly; guided to seated on bed safely by OT.      Balance Overall balance assessment: Needs assistance Sitting-balance support: Feet supported Sitting balance-Leahy Scale: Fair     Standing balance support: Bilateral upper extremity supported, Reliant on assistive device for balance Standing balance-Leahy Scale: Poor                             ADL either performed or assessed with clinical judgement   ADL Overall ADL's : Needs assistance/impaired Eating/Feeding: Set up;Sitting Eating/Feeding Details (indicate cue type and reason): Pt received eating lunch at tray table while semi-reclined in bed and struggling due to height of tray. Improved with sitting EOB where tray was better positioned. Grooming: Set up;Sitting;Oral care Grooming Details (indicate cue type and reason): Set up at tray table.  General ADL Comments: Anticipate pt to need significant assist with LB ADLs and ADLs requiring standing, as pt has strong posterior lean in standing and very poor standing tolerance.     Vision Patient Visual Report: No change from baseline       Perception     Praxis      Pertinent  Vitals/Pain Pain Assessment Pain Assessment: No/denies pain     Hand Dominance Right   Extremity/Trunk Assessment Upper Extremity Assessment Upper Extremity Assessment: Overall WFL for tasks assessed;Generalized weakness   Lower Extremity Assessment Lower Extremity Assessment: Generalized weakness;Defer to PT evaluation       Communication Communication Communication: No difficulties   Cognition Arousal/Alertness: Awake/alert Behavior During Therapy: WFL for tasks assessed/performed Overall Cognitive Status: No family/caregiver present to determine baseline cognitive functioning                                 General Comments: Pt with no hx of dementia or MCI in medical record, but not oriented to time (thinks it is March 2022).  Does know that his daughter is an Charity fundraiser, that he is in [place], and that he's in a hospital "that starts with a C". Does not know why he's admitted (when asked "why did you come to the hospital?" he states "because I'm planning to move to [place]".) Is aware of his poor balance; able to state "I'm falling backwards" when having posterior lean just before sitting on EOB.     General Comments  Pt pleasant and willing to work with OT. Appears very deconditioned. Appears to have increased work of breathing with standing; however, unable to get pulseox reading as pt's fingers are cold. Telemetry reading showing around 90-100 pulse.    Exercises     Shoulder Instructions      Home Living Family/patient expects to be discharged to:: Other (Comment)                                 Additional Comments: Independent living facility, per medical record. Pt not able to provide any reliable history today, as he states "I'm getting ready to move to [facility]" while the medical record states he has lived in ILF for a few months.      Prior Functioning/Environment Prior Level of Function : Patient poor historian/Family not available              Mobility Comments: Medical records state [facility] and/or RW use before admission ADLs Comments: Unclear how pt has been doing with managing ADLs since prior admission.        OT Problem List: Decreased strength;Decreased activity tolerance;Impaired balance (sitting and/or standing);Decreased cognition;Decreased safety awareness;Decreased knowledge of precautions      OT Treatment/Interventions: Self-care/ADL training;Therapeutic exercise;Therapeutic activities;Cognitive remediation/compensation    OT Goals(Current goals can be found in the care plan section) Acute Rehab OT Goals Patient Stated Goal: Get better OT Goal Formulation: Patient unable to participate in goal setting Time For Goal Achievement: 06/08/22 Potential to Achieve Goals: Fair ADL Goals Pt Will Perform Grooming: with modified independence;standing Pt Will Perform Lower Body Bathing: with modified independence;sitting/lateral leans Pt Will Perform Lower Body Dressing: with modified independence;sit to/from stand Pt Will Transfer to Toilet: with modified independence;bedside commode;ambulating Pt Will Perform Toileting - Clothing Manipulation and hygiene: with modified independence;sit to/from stand Pt Will Perform Tub/Shower Transfer: with min guard assist;ambulating;shower  seat  OT Frequency: Min 3X/week    Co-evaluation              AM-PAC OT "6 Clicks" Daily Activity     Outcome Measure Help from another person eating meals?: None Help from another person taking care of personal grooming?: A Little Help from another person toileting, which includes using toliet, bedpan, or urinal?: A Lot Help from another person bathing (including washing, rinsing, drying)?: A Lot Help from another person to put on and taking off regular upper body clothing?: A Little Help from another person to put on and taking off regular lower body clothing?: Total 6 Click Score: 15   End of Session Equipment Utilized  During Treatment: Gait belt;Rolling Lutz (2 wheels) Nurse Communication: Mobility status  Activity Tolerance: Patient tolerated treatment well Patient left: in bed;with call bell/phone within reach;with bed alarm set  OT Visit Diagnosis: Unsteadiness on feet (R26.81);Repeated falls (R29.6);Muscle weakness (generalized) (M62.81);History of falling (Z91.81)                Time: 1331-1400 OT Time Calculation (min): 29 min Charges:  OT General Charges $OT Visit: 1 Visit OT Evaluation $OT Eval Moderate Complexity: 1 Mod OT Treatments $Self Care/Home Management : 8-22 mins  Linward Foster, MS, OTR/L  Alvester Morin 05/25/2022, 3:44 PM

## 2022-05-25 NOTE — Progress Notes (Signed)
Progress Note   Patient: Curtis Wagner NWG:[id number] DOB: 01-Oct-1940 DOA: 05/24/2022     1 DOS: the patient was seen and examined on 05/25/2022   Brief hospital course: HPI on admission 05/24/22 by Dr. Clyde Lundborg: "Curtis Wagner is a 82 y.o. 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   Per her daughter at the bedside, patient fell in the early morning at about 3 AM.  He is supposed to use Dubs, but did not use it. No loss of consciousness.  Patient has abrasion in forehead.  No unilateral numbness or tinglings in extremities.  No facial droop or slurred speech.  Patient does not have chest pain, cough, shortness of breath.  No nausea, vomiting or abdominal pain.  Patient has diarrhea in the past several days, several watery diarrhea each day.  No fever or chills.  No symptoms of UTI.  Patient reports right lower leg pain which has been going on for about 3 days, also has swelling and erythema in left lower leg.   Data reviewed independently and ED Course: pt was found to have WBC 7.2, BNP 2406, lactic acid 4.5, troponin level 22, 21, potassium 5.3, worsening renal function with creatinine 2.69, BUN 32 and GFR 23 (recent baseline creatinine 1.39 on 04/17/2022), abnormal liver function (ALP 89, AST 138, ALT 53, total bilirubin 1.8).  Temperature normal, soft blood pressure 86/61 which improved to 100/83 after giving 750 cc LR bolus in ED --> 98/70, heart rate 69 --> 103, RR 16-->23, oxygen saturation 98% on room air.  Chest x-ray negative.  Images are negative for acute injury including CT of head, CT of C-spine, x-ray of her bilateral ankles, x-ray of left forearm.  Right lower extremity venous Doppler negative for DVT but showed Baker's cyst.  Patient is admitted to telemetry bed as inpatient"   Further hospital course and management as outlined below.     Assessment and Plan: * Cellulitis of right lower extremity See Severe Sepsis  Acute metabolic encephalopathy Due to infection/sepsis, renal failure and other metabolic derangements, hospital environement 5/2 AM - nursing staff reported severe agitation, combativeness, pt pulled out his IV's was attempting to bite staff --Improved with 2.5 mg IV Valium --Avoiding antipsychotics as Qtc was prolonged on admission EKD --Continue PRN IV Valium but avoid if possible --Sitter if needed for safety --Delirium precautions  Severe sepsis (HCC) Due to RLE Cellulitis Strep pyogenes Bacteremia With tachycardia HR 103 and tachypnea RR 23.  No fever or leukocytosis.  Lactic acidosis, AKI, transaminitis consistent with organ dysfunction and severe sepsis.  --Continue IV PCN G and Clinda --ID is consulted, appreciate recommendations --Concern for Toxic Shock Syndrome with with AKI, transaminitis, coagulopathy, hypotension -- ID to start IVIG --Monitor volume status  --Follow cultures --Trend lactic acid, procal  Acute renal failure superimposed on stage 3a chronic kidney disease (HCC) Cr on admission 2.69 Baseline Cr 1.3-1.4 one month ago Suspect due to severe sepsis. At risk for cardiorenal syndrome given low EF. --Avoid using renal toxic medications --Hold spironolactone --Consulted Nephrology  Chronic systolic CHF (congestive heart failure) (HCC) Echo on 04/12/2022 showed EF<20%.  Despite elevated BNP 2406, no dypsnea or hypoxia, no pulm edema on chest x-ray. Overall compensated on admission. High risk of CHF decompensation. --Watch volume status closely --Avoid IV fluids if possible --Hold spironolactone due to AKI and soft BP --Strict I/O's & Daily Wts --  ID might be starting IVIG, will likely need some diuresis to offset  Hyponatremia Na on admission 134 >> 130 Suspect Hypervolemic, given low EF and given sepsis fluids --Monitor BMP --Daily weights, strict I/O's --Hold further  fluids --Hold diuresis for now with AKI If renal function plateaus or worsens, likely cardiorenal AKI and will diurese  Diarrhea Follow stool studies Enteric precautions for now Monitor & replace electrolytes  Myocardial injury Due to demand ischemia in setting of severe sepsis.  No chest pain, EKG non-acute. Troponin 22 >> 21  Abnormal LFTs Possibly due to sepsis / ? Toxic shock synd given Strep bacteremia. Acute hepatitis panel negative. --Monitor LFT's  Elevated lactic acid level Due to severe sepsis. Lactate trend: 4.5 >> 3.4 >> 3.2 >> 2.9 .> 2.7 Continue to trend lactate Avoiding IV fluids with very low EF  Hyperkalemia K was 5.3 on admission Resolved K 4.7 --Monitor BMP  PAD (peripheral artery disease) (HCC) Recent LLE re-vascularization in March admission with vascular surgery --On Eliquis, Lipitor --Follow up as scheduled with Vascular --Consult Vascular if acute issues develop  Fall at home, initial encounter PT/OT Daughter feels pt needs rehab and then ALF Fall precautions  Hypertension --Hold metoprolol due to soft BP --Hold spironolactone for soft BP, AKI --IV hydralazine as needed  PAF (paroxysmal atrial fibrillation) (HCC) Continue Eliquis, amiodarone Metoprolol on hold for soft BP's Telemetry  History of pulmonary embolism On Eliquis  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  Earlier he was combative and agitated, got IV valium and now calm and appropriate.  Daughter had to leave, updated her earlier.  Pt denies complaints including headache pain, shortness of breath, chest pain.  Physical Exam: Vitals:   05/24/22 1816 05/24/22  2344 05/25/22 0500 05/25/22 0745  BP: 105/60 (!) 97/55  (!) 89/55  Pulse: 88 79  83  Resp: 16 18  16   Temp: 97.9 F (36.6 C) 98.2 F (36.8 C)  98.1 F (36.7 C)  TempSrc:  Oral    SpO2: 91% 97%  94%  Weight:   82.4 kg   Height:       General exam: awake, alert, no acute distress HEENT: forehead abrasion and surrounding ecchymosis, moist mucus membranes, hearing grossly normal  Respiratory system: CTAB, no wheezes, rales or rhonchi, normal respiratory effort. Cardiovascular system: normal S1/S2, RRR, Right > left lower extremity edema (2-3+ on right, 1+ on left).   Gastrointestinal system: soft, NT, ND, no HSM felt, +bowel sounds. Central nervous system: A&O x self. no gross focal neurologic deficits, normal speech Extremities: RLE anterior lower leg abrasion with surrounding erythema, warmth, significant RLE edema, less LLE edema, moves all Skin: dry, intact, warmth and erythema of distal RLE Psychiatry: normal mood, congruent affect, abnormal judgement and insight     Data Reviewed:  Notable labs ---  Na 130 from 134 Bicarb 15 BUN 36 Cr 2.52 from 2.69 Ca 7.8 Lipid profile normal except HDL low 27 Most recent lactate 2.7 Platelets 140k Acute hepatitis panel - negative   Micro --  Blood cultures + Strep pyogenes C diff - pending GI panel - pending  Family Communication: Daughter updated at nurses station this AM  Disposition:  Status is: Inpatient Remains inpatient appropriate because: remains on IV therapies as above, severity of illness with multiple acute issues as outlined   Planned Discharge Destination: Skilled nursing facility    Time spent: 56 minutes including time at bedside and coordination of care  Author: Pennie Banter, DO 05/25/2022 2:32 PM  For on call review [email or web address].

## 2022-05-26 DIAGNOSIS — L03115 Cellulitis of right lower limb: Secondary | ICD-10-CM | POA: Diagnosis not present

## 2022-05-26 DIAGNOSIS — B950 Streptococcus, group A, as the cause of diseases classified elsewhere: Secondary | ICD-10-CM | POA: Diagnosis not present

## 2022-05-26 DIAGNOSIS — G9340 Encephalopathy, unspecified: Secondary | ICD-10-CM | POA: Diagnosis not present

## 2022-05-26 DIAGNOSIS — R7401 Elevation of levels of liver transaminase levels: Secondary | ICD-10-CM | POA: Diagnosis not present

## 2022-05-26 DIAGNOSIS — N1330 Unspecified hydronephrosis: Secondary | ICD-10-CM | POA: Diagnosis present

## 2022-05-26 DIAGNOSIS — R9431 Abnormal electrocardiogram [ECG] [EKG]: Secondary | ICD-10-CM | POA: Diagnosis present

## 2022-05-26 LAB — COMPREHENSIVE METABOLIC PANEL
ALT: 41 U/L (ref 0–44)
AST: 49 U/L — ABNORMAL HIGH (ref 15–41)
Albumin: 2.2 g/dL — ABNORMAL LOW (ref 3.5–5.0)
Alkaline Phosphatase: 64 U/L (ref 38–126)
Anion gap: 7 (ref 5–15)
BUN: 51 mg/dL — ABNORMAL HIGH (ref 8–23)
CO2: 22 mmol/L (ref 22–32)
Calcium: 7.7 mg/dL — ABNORMAL LOW (ref 8.9–10.3)
Chloride: 100 mmol/L (ref 98–111)
Creatinine, Ser: 2.82 mg/dL — ABNORMAL HIGH (ref 0.61–1.24)
GFR, Estimated: 21 mL/min — ABNORMAL LOW (ref >60)
Glucose, Bld: 97 mg/dL (ref 70–99)
Potassium: 4.2 mmol/L (ref 3.5–5.1)
Sodium: 129 mmol/L — ABNORMAL LOW (ref 135–145)
Total Bilirubin: 1.1 mg/dL (ref 0.3–1.2)
Total Protein: 6.1 g/dL — ABNORMAL LOW (ref 6.5–8.1)

## 2022-05-26 LAB — CBC
HCT: 36.2 % — ABNORMAL LOW (ref 39.0–52.0)
Hemoglobin: 12.1 g/dL — ABNORMAL LOW (ref 13.0–17.0)
MCH: 32.7 pg (ref 26.0–34.0)
MCHC: 33.4 g/dL (ref 30.0–36.0)
MCV: 97.8 fL (ref 80.0–100.0)
Platelets: 123 10*3/uL — ABNORMAL LOW (ref 150–400)
RBC: 3.7 MIL/uL — ABNORMAL LOW (ref 4.22–5.81)
RDW: 15.6 % — ABNORMAL HIGH (ref 11.5–15.5)
WBC: 10.8 10*3/uL — ABNORMAL HIGH (ref 4.0–10.5)
nRBC: 0 % (ref 0.0–0.2)

## 2022-05-26 LAB — CULTURE, BLOOD (ROUTINE X 2)

## 2022-05-26 LAB — MAGNESIUM: Magnesium: 1.9 mg/dL (ref 1.7–2.4)

## 2022-05-26 MED ORDER — LINEZOLID 600 MG PO TABS
600.0000 mg | ORAL_TABLET | Freq: Two times a day (BID) | ORAL | Status: DC
  Administered 2022-05-26 – 2022-05-28 (×4): 600 mg via ORAL
  Filled 2022-05-26 (×4): qty 1

## 2022-05-26 MED ORDER — DIAZEPAM 5 MG PO TABS
2.5000 mg | ORAL_TABLET | Freq: Four times a day (QID) | ORAL | Status: DC | PRN
  Administered 2022-05-26 – 2022-06-09 (×3): 2.5 mg via ORAL
  Filled 2022-05-26 (×3): qty 1

## 2022-05-26 MED ORDER — DIAZEPAM 5 MG/ML IJ SOLN: 2.5000 mg | Freq: Three times a day (TID) | INTRAMUSCULAR | Status: DC | PRN

## 2022-05-26 MED ORDER — SODIUM CHLORIDE 0.9 % IV SOLN: INTRAVENOUS | Status: DC

## 2022-05-26 NOTE — Consult Note (Signed)
[facility]  CONSULT NOTE    Date: 05/26/2022                  Patient Name:  Curtis Wagner  MRN: [id number]  DOB: [birth year]  Age / Sex: 82 y.o., male         PCP: Margaretann Loveless, MD                 Service Requesting Consult: [facility]                 Reason for Consult: Acute kidney injury            History of Present Illness: Mr. Curtis Wagner is a 82 y.o.  male with past medical problems include hypertension, PVD, CAD, CHF, atrial fibrillation, and chronic kidney disease stage IIIa, who was admitted to [facility] on 05/24/2022 for Cellulitis of right lower extremity [L03.115] Injury of head, initial encounter [S09.90XA] Fall, initial encounter [W19.XXXA] Fall at home, initial encounter [phone], Y92.009] Hypotension, unspecified hypotension type [I95.9]  Patient presents to the emergency department after suffering a fall.  Patient states he lives alone but has a relatives that check on him that stay nearby.  Patient seen sitting up in chair, currently eating breakfast.  Patient is alert and oriented to self.  Questionable history.  No family or friends present at bedside.  Patient states he has an infection and right lower leg pain.  Denies use of NSAIDs or other pain medications.  Denies nausea or vomiting.  Denies shortness of breath, currently on room air.  Labs on ED arrival significant for sodium 134, potassium 5.3, serum bicarb 18, BUN 32, creatinine 2.59 with GFR 23, BNP 2400, lactic acid 4.5.  Blood cultures positive for Streptococcus, group today strep.  CT head shows age-related changes.  CT cervical spine negative for acute findings.  All imaging negative for fracture.  CT abdomen pelvis shows significant left-sided hydronephrosis with hydroureter down to bladder without obstructing calculus or mass.  12 mm left renal calculus present.     Medications: Outpatient medications: Medications Prior to Admission  Medication Sig Dispense Refill Last Dose    allopurinol (ZYLOPRIM) 300 MG tablet Take 1 tablet (300 mg total) by mouth daily. Last dispense not on file 90 tablet 3 prn at unk   amiodarone (PACERONE) 200 MG tablet Take 1 tablet (200 mg total) by mouth 2 (two) times daily. 180 tablet 3 05/23/2022   apixaban (ELIQUIS) 5 MG TABS tablet Take 1 tablet (5 mg total) by mouth 2 (two) times daily. 180 tablet 0 05/23/2022 at 2000   atorvastatin (LIPITOR) 80 MG tablet Take 80 mg by mouth daily. Last dispense 03/2022 30DS   05/23/2022   clopidogrel (PLAVIX) 75 MG tablet Take 1 tablet (75 mg total) by mouth daily. 90 tablet 3 05/23/2022 at 1000   escitalopram (LEXAPRO) 20 MG tablet Take 1 tablet (20 mg total) by mouth daily. 30 tablet 3 05/23/2022   JARDIANCE 10 MG TABS tablet Take 1 tablet (10 mg total) by mouth every morning. Last dispense 02/2022 30DS 90 tablet 3 05/23/2022   metoprolol succinate (TOPROL-XL) 25 MG 24 hr tablet Take 1 tablet (25 mg total) by mouth daily. Take with or immediately following a meal. 30 tablet 0 05/23/2022   Multiple Vitamins-Minerals (CENTRUM SILVER ADULT 50+) TABS Take 1 tablet by mouth daily.   05/23/2022   nitroGLYCERIN (NITROSTAT) 0.4 MG SL tablet Place 0.4 mg under the tongue every 5 (five) minutes as  needed for chest pain.   prn at unk   potassium chloride SA (KLOR-CON M) 20 MEQ tablet Take 2 tablets (40 mEq total) by mouth daily. Last dispense 12/2021 30DS (Patient taking differently: Take 20 mEq by mouth daily. Last dispense 12/2021 30DS) 90 tablet 3 05/23/2022   spironolactone (ALDACTONE) 25 MG tablet Take 0.5 tablets (12.5 mg total) by mouth daily. 45 tablet 3 05/23/2022   tamsulosin (FLOMAX) 0.4 MG CAPS capsule Take 0.4 mg by mouth at bedtime. Last dispense 03/2022 30DS   05/23/2022    Current medications: Current Facility-Administered Medications  Medication Dose Route Frequency Provider Last Rate Last Admin   0.9 %  sodium chloride infusion   Intravenous Continuous Wendee Beavers, NP   Held at 05/26/22 1422    acetaminophen (TYLENOL) tablet 325 mg  325 mg Oral Q6H PRN Lorretta Harp, MD       acetaminophen (TYLENOL) tablet 650 mg  650 mg Oral Daily PRN Odette Fraction, MD       albuterol (PROVENTIL) (2.5 MG/3ML) 0.083% nebulizer solution 3 mL  3 mL Inhalation Q4H PRN Lorretta Harp, MD       amiodarone (PACERONE) tablet 200 mg  200 mg Oral BID Lorretta Harp, MD   200 mg at 05/26/22 0850   apixaban (ELIQUIS) tablet 5 mg  5 mg Oral BID Lorretta Harp, MD   5 mg at 05/26/22 0849   atorvastatin (LIPITOR) tablet 80 mg  80 mg Oral Daily Lorretta Harp, MD   80 mg at 05/26/22 0849   clopidogrel (PLAVIX) tablet 75 mg  75 mg Oral Daily Lorretta Harp, MD   75 mg at 05/26/22 0849   diazepam (VALIUM) injection 2.5 mg  2.5 mg Intravenous Q8H PRN Esaw Grandchild A, DO       diazepam (VALIUM) tablet 2.5 mg  2.5 mg Oral Q6H PRN Esaw Grandchild A, DO   2.5 mg at 05/26/22 0850   diphenhydrAMINE (BENADRYL) injection 12.5 mg  12.5 mg Intravenous Q8H PRN Aleda Grana, RPH       escitalopram (LEXAPRO) tablet 20 mg  20 mg Oral Daily Lorretta Harp, MD   20 mg at 05/26/22 0849   hydrALAZINE (APRESOLINE) injection 5 mg  5 mg Intravenous Q2H PRN Lorretta Harp, MD       Immune Globulin 10% (PRIVIGEN) IV infusion 40 g  500 mg/kg (Ideal) Intravenous Q24 Hr x 2 Manandhar, Sabina, MD   40 g at 05/26/22 1358   linezolid (ZYVOX) tablet 600 mg  600 mg Oral Q12H Zeigler, Dustin G, RPH       multivitamin with minerals tablet 1 tablet  1 tablet Oral Daily Lorretta Harp, MD   1 tablet at 05/26/22 1610   oxyCODONE (Oxy IR/ROXICODONE) immediate release tablet 5 mg  5 mg Oral Q6H PRN Lorretta Harp, MD   5 mg at 05/24/22 2107   penicillin G potassium 3 Million Units in dextrose 50mL IVPB  3 Million Units Intravenous Q6H Otelia Sergeant, RPH 100 mL/hr at 05/26/22 1144 3 Million Units at 05/26/22 1144   tamsulosin (FLOMAX) capsule 0.4 mg  0.4 mg Oral QHS Lorretta Harp, MD   0.4 mg at 05/26/22 0024      Allergies: Allergies  Allergen Reactions   Morphine Other (See  Comments)   Morphine And Related Other (See Comments)    HALLUCINATIONS      Past Medical History: Past Medical History:  Diagnosis Date   Arthritis    was a quarterback, 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   DVT (deep venous thrombosis) (HCC)    History of kidney stones    Hyperlipidemia    Hypertension    PAF (paroxysmal atrial fibrillation) (HCC)      Past Surgical History: Past Surgical History:  Procedure Laterality Date   EYE SURGERY Right    /w IOL   LOWER EXTREMITY ANGIOGRAPHY Left 04/12/2022   Procedure: Lower Extremity Angiography;  Surgeon: Annice Needy, MD;  Location: [facility] INVASIVE CV LAB;  Service: Cardiovascular;  Laterality: Left;   LOWER EXTREMITY INTERVENTION Left 04/13/2022   Procedure: LOWER EXTREMITY INTERVENTION;  Surgeon: Annice Needy, MD;  Location: [facility] INVASIVE CV LAB;  Service: Cardiovascular;  Laterality: Left;   SHOULDER SURGERY Left 1961   TONSILLECTOMY     TOTAL SHOULDER ARTHROPLASTY Right 01/01/2015   Procedure: RIGHT TOTAL SHOULDER ARTHROPLASTY;  Surgeon: Loreta Ave, MD;  Location: [facility] OR;  Service: Orthopedics;  Laterality: Right;     Family History: Family History  Problem Relation Age of Onset   Rheumatic fever Mother    Stroke Mother    Heart disease Mother    Stroke Father      Social History: Social History   Socioeconomic History   Marital status: Divorced    Spouse name: Not on file   Number of children: Not on file   Years of education: Not on file   Highest education level: Not on file  Occupational History   Not on file  Tobacco Use   Smoking status: Former   Smokeless tobacco: Former    Types: Associate Professor Use: Never used  Substance and Sexual Activity   Alcohol use: No    Alcohol/week: 0.0 standard drinks of alcohol   Drug use: No   Sexual activity: Not on file  Other Topics Concern   Not on file  Social History Narrative   Not on file   Social Determinants of Health   Financial  Resource Strain: Not on file  Food Insecurity: No Food Insecurity (05/24/2022)   Hunger Vital Sign    Worried About Running Out of Food in the Last Year: Never true    Ran Out of Food in the Last Year: Never true  Transportation Needs: No Transportation Needs (05/24/2022)   PRAPARE - Administrator, Civil Service (Medical): No    Lack of Transportation (Non-Medical): No  Physical Activity: Not on file  Stress: Not on file  Social Connections: Not on file  Intimate Partner Violence: Not At Risk (05/24/2022)   Humiliation, Afraid, Rape, and Kick questionnaire    Fear of Current or Ex-Partner: No    Emotionally Abused: No    Physically Abused: No    Sexually Abused: No     Review of Systems: Review of Systems  Constitutional:  Negative for chills, fever and malaise/fatigue.  HENT:  Negative for congestion, sore throat and tinnitus.   Eyes:  Negative for blurred vision and redness.  Respiratory:  Negative for cough, shortness of breath and wheezing.   Cardiovascular:  Negative for chest pain, palpitations, claudication and leg swelling.  Gastrointestinal:  Negative for abdominal pain, blood in stool, diarrhea, nausea and vomiting.  Genitourinary:  Negative for flank pain, frequency and hematuria.  Musculoskeletal:  Positive for falls. Negative for back pain and myalgias.  Skin:  Negative for rash.  Neurological:  Negative for dizziness, weakness and headaches.  Endo/Heme/Allergies:  Does not bruise/bleed easily.  Psychiatric/Behavioral:  Negative for depression.  The patient is not nervous/anxious and does not have insomnia.     Vital Signs: Blood pressure 103/65, pulse 84, temperature 97.8 F (36.6 C), resp. rate 18, height 5\' 11"  (1.803 m), weight 84 kg, SpO2 95 %.  Weight trends: Filed Weights   05/24/22 1236 05/25/22 0500 05/26/22 0428  Weight: 82.6 kg 82.4 kg 84 kg    Physical Exam: General: NAD,   Head: Normocephalic, atraumatic. Moist oral mucosal membranes   Eyes: Anicteric  Lungs:  Clear to auscultation, normal effort  Heart: Regular rate and rhythm  Abdomen:  Soft, nontender  Extremities:  1+ peripheral edema, greater in left than right.  Neurologic: Nonfocal, moving all four extremities  Skin: No lesions, Lt lower leg erythema   Access: None     Lab results: Basic Metabolic Panel: Recent Labs  Lab 05/24/22 1309 05/25/22 0215 05/26/22 0452  NA 134* 130* 129*  K 5.3* 4.7 4.2  CL 103 105 100  CO2 18* 15* 22  GLUCOSE 99 90 97  BUN 32* 36* 51*  CREATININE 2.69* 2.52* 2.82*  CALCIUM 8.4* 7.8* 7.7*  MG  --   --  1.9    Liver Function Tests: Recent Labs  Lab 05/24/22 1309 05/26/22 0452  AST 138* 49*  ALT 53* 41  ALKPHOS 89 64  BILITOT 1.8* 1.1  PROT 6.1* 6.1*  ALBUMIN 2.9* 2.2*   No results for input(s): "LIPASE", "AMYLASE" in the last 168 hours. No results for input(s): "AMMONIA" in the last 168 hours.  CBC: Recent Labs  Lab 05/24/22 1309 05/25/22 0215 05/26/22 0452  WBC 7.2 7.3 10.8*  NEUTROABS 6.4  --   --   HGB 13.4 13.0 12.1*  HCT 41.1 38.7* 36.2*  MCV 100.5* 98.5 97.8  PLT 155 140* 123*    Cardiac Enzymes: Recent Labs  Lab 05/24/22 1309  CKTOTAL 93    BNP: Invalid input(s): "POCBNP"  CBG: No results for input(s): "GLUCAP" in the last 168 hours.  Microbiology: Results for orders placed or performed during the hospital encounter of 05/24/22  Blood Culture (routine x 2)     Status: Abnormal (Preliminary result)   Collection Time: 05/24/22  1:09 PM   Specimen: BLOOD  Result Value Ref Range Status   Specimen Description   Final    BLOOD BLOOD LEFT ARM Performed at [facility], [address] [place], [place] [address]    Special Requests   Final    BOTTLES DRAWN AEROBIC AND ANAEROBIC Blood Culture results may not be optimal due to an excessive volume of blood received in culture bottles Performed at [facility], [address], [place], [place] [address]    Culture   Setup Time   Final    GRAM POSITIVE COCCI IN BOTH AEROBIC AND ANAEROBIC BOTTLES CRITICAL VALUE NOTED.  VALUE IS CONSISTENT WITH PREVIOUSLY REPORTED AND CALLED VALUE. Performed at [facility], [address], [place], [place] [address]    Culture GROUP A STREP (S.PYOGENES) ISOLATED (A)  Final   Report Status PENDING  Incomplete  Blood Culture (routine x 2)     Status: Abnormal (Preliminary result)   Collection Time: 05/24/22  1:09 PM   Specimen: BLOOD  Result Value Ref Range Status   Specimen Description   Final    BLOOD BLOOD LEFT ARM Performed at [facility], [address], [place], [place] [address]    Special Requests   Final    BOTTLES DRAWN AEROBIC AND ANAEROBIC Blood Culture adequate  volume Performed at [facility], [address] [place], [place] [address]    Culture  Setup Time   Final    GRAM POSITIVE COCCI IN BOTH AEROBIC AND ANAEROBIC BOTTLES CRITICAL RESULT CALLED TO, READ BACK BY AND VERIFIED WITH: NATAN BLUE@0428  05/25/22 RH    Culture (A)  Final    GROUP A STREP (S.PYOGENES) ISOLATED SUSCEPTIBILITIES TO FOLLOW HEALTH DEPARTMENT NOTIFIED Performed at [facility], [address] [place], [place] [address]    Report Status PENDING  Incomplete  Blood Culture ID Panel (Reflexed)     Status: Abnormal   Collection Time: 05/24/22  1:09 PM  Result Value Ref Range Status   Enterococcus faecalis NOT DETECTED NOT DETECTED Final   Enterococcus Faecium NOT DETECTED NOT DETECTED Final   Listeria monocytogenes NOT DETECTED NOT DETECTED Final   Staphylococcus species NOT DETECTED NOT DETECTED Final   Staphylococcus aureus (BCID) NOT DETECTED NOT DETECTED Final   Staphylococcus epidermidis NOT DETECTED NOT DETECTED Final   Staphylococcus lugdunensis NOT DETECTED NOT DETECTED Final   Streptococcus species DETECTED (A) NOT DETECTED Final    Comment: CRITICAL RESULT CALLED TO, READ BACK BY AND VERIFIED WITH: NATHAN BLUE@0428  05/25/22 RH     Streptococcus agalactiae NOT DETECTED NOT DETECTED Final   Streptococcus pneumoniae NOT DETECTED NOT DETECTED Final   Streptococcus pyogenes DETECTED (A) NOT DETECTED Final    Comment: CRITICAL RESULT CALLED TO, READ BACK BY AND VERIFIED WITH: NATHAN BLUE@0428  05/25/22 RH    A.calcoaceticus-baumannii NOT DETECTED NOT DETECTED Final   Bacteroides fragilis NOT DETECTED NOT DETECTED Final   Enterobacterales NOT DETECTED NOT DETECTED Final   Enterobacter cloacae complex NOT DETECTED NOT DETECTED Final   Escherichia coli NOT DETECTED NOT DETECTED Final   Klebsiella aerogenes NOT DETECTED NOT DETECTED Final   Klebsiella oxytoca NOT DETECTED NOT DETECTED Final   Klebsiella pneumoniae NOT DETECTED NOT DETECTED Final   Proteus species NOT DETECTED NOT DETECTED Final   Salmonella species NOT DETECTED NOT DETECTED Final   Serratia marcescens NOT DETECTED NOT DETECTED Final   Haemophilus influenzae NOT DETECTED NOT DETECTED Final   Neisseria meningitidis NOT DETECTED NOT DETECTED Final   Pseudomonas aeruginosa NOT DETECTED NOT DETECTED Final   Stenotrophomonas maltophilia NOT DETECTED NOT DETECTED Final   Candida albicans NOT DETECTED NOT DETECTED Final   Candida auris NOT DETECTED NOT DETECTED Final   Candida glabrata NOT DETECTED NOT DETECTED Final   Candida krusei NOT DETECTED NOT DETECTED Final   Candida parapsilosis NOT DETECTED NOT DETECTED Final   Candida tropicalis NOT DETECTED NOT DETECTED Final   Cryptococcus neoformans/gattii NOT DETECTED NOT DETECTED Final    Comment: Performed at [facility], [address] [place], [place] [address]    Coagulation Studies: Recent Labs    05/24/22 1309  LABPROT 24.6*  INR 2.2*    Urinalysis: No results for input(s): "COLORURINE", "LABSPEC", "PHURINE", "GLUCOSEU", "HGBUR", "BILIRUBINUR", "KETONESUR", "PROTEINUR", "UROBILINOGEN", "NITRITE", "LEUKOCYTESUR" in the last 72 hours.  Invalid input(s): "APPERANCEUR"    Imaging: CT  ABDOMEN PELVIS WO CONTRAST  Result Date: 05/24/2022 CLINICAL DATA:  Sepsis EXAM: CT ABDOMEN AND PELVIS WITHOUT CONTRAST TECHNIQUE: Multidetector CT imaging of the abdomen and pelvis was performed following the standard protocol without IV contrast. RADIATION DOSE REDUCTION: This exam was performed according to the departmental dose-optimization program which includes automated exposure control, adjustment of the mA and/or kV according to patient size and/or use of iterative reconstruction technique. COMPARISON:  None Available. FINDINGS: Lower chest: Moderate-sized  bilateral pleural effusions with overlying atelectasis. The heart is mildly enlarged. No pericardial effusion. The distal esophagus is grossly normal. Aortic and coronary artery calcifications are noted. Hepatobiliary: No hepatic lesions are identified without contrast. No intrahepatic biliary dilatation. The gallbladder contains several calcified gallstones but no definite CT findings for acute cholecystitis. No common bile duct dilatation. Pancreas: Moderate age related atrophy but no mass, inflammation or ductal dilatation. Spleen: Normal sized spleen. No splenic lesions. Small amount of perisplenic fluid noted. Adrenals/Urinary Tract: Bilateral low-attenuation adrenal gland nodules consistent with benign adenomas. No further imaging evaluation follow-up is necessary. Simple bilateral renal cysts not requiring any further imaging evaluation or follow-up. 12 mm left renal calculus with overlying significant renal cortical thinning/scarring. This could be in a E calyceal diverticulum. Significant left-sided hydronephrosis and hydroureter all the way down to the bladder without evidence of an obstructing calculus or mass. The right ureter is normal in caliber. There is a small calculus noted in the right-side bladder no bladder mass asymmetric bladder thickening. Stomach/Bowel: The stomach, duodenum, small and colon are grossly normal. The terminal ileum  and appendix are normal. Sigmoid colon diverticulosis without findings for acute diverticulitis. Vascular/Lymphatic: Advanced atherosclerotic calcification involving the aorta and branch vessels but no aneurysm. No mesenteric or retroperitoneal mass adenopathy. Small scattered lymph nodes are noted. Reproductive: Enlarged prostate gland with mild impression on the base of the bladder. The seminal vesicles are unremarkable. Other: Small amount of free fluid around the liver and spleen, in the pericolic gutters and in the pelvis. Diffuse subcutaneous edema suggesting anasarca. Musculoskeletal: No significant bony findings. IMPRESSION: 1. Significant left-sided hydronephrosis and hydroureter all the way down to the bladder without evidence of an obstructing calculus or mass. 2. 12 mm left renal calculus with overlying significant renal cortical thinning/scarring. This could be in a calyceal diverticulum. 3. Small calculus in the right-side of the bladder. 4. Moderate-sized bilateral pleural effusions with overlying atelectasis. 5. Small amount of abdominal/pelvic ascites. 6. Cholelithiasis. 7. 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 8. Advanced atherosclerotic calcification involving the aorta and branch vessels. Aortic Atherosclerosis (ICD10-I70.0). Electronically Signed   By: Rudie Meyer M.D.   On: 05/24/2022 15:16   US Venous Img Lower Unilateral Right  Result Date: 05/24/2022 CLINICAL DATA:  Right lower extremity swelling EXAM: RIGHT LOWER EXTREMITY VENOUS DOPPLER ULTRASOUND TECHNIQUE: Gray-scale sonography with compression, as well as color and duplex ultrasound, were performed to evaluate the deep venous system(s) from the level of the common femoral vein through the popliteal and proximal calf veins. COMPARISON:  None Available. FINDINGS: VENOUS Normal compressibility of the common femoral, superficial femoral, and popliteal veins, as well as the visualized calf veins. Visualized portions of  profunda femoral vein and great saphenous vein unremarkable. No filling defects to suggest DVT on grayscale or color Doppler imaging. Doppler waveforms show normal direction of venous flow, normal respiratory plasticity and response to augmentation. Limited views of the contralateral common femoral vein are unremarkable. OTHER None. Limitations: Complex Baker's cyst 5.9 by 2.4 by 2.2 cm in the popliteal fossa. IMPRESSION: 1. No right lower extremity DVT. 2. Complex Baker's cyst. Electronically Signed   By: Gaylyn Rong M.D.   On: 05/24/2022 14:36     Assessment & Plan: Mr. Amous Boateng is a 82 y.o.  male with past medical problems include hypertension, PVD, CAD, CHF, atrial fibrillation, and chronic kidney disease stage IIIa,, who was admitted to [facility] on 05/24/2022 for Cellulitis of right lower extremity [L03.115] Injury of head, initial  encounter [S09.90XA] Fall, initial encounter [W19.XXXA] Fall at home, initial encounter [phone], Y92.009] Hypotension, unspecified hypotension type [I95.9]  Acute kidney injury on chronic kidney disease stage IIIa. Baseline creatinine 1.30 with GFR 54 on 04/13/22. CT abd pelvis shows a significant left-sided hydronephrosis with hydroureter down to bladder without obstructing calculus or mass and 12 mm left renal calculus present. Acute kidney injury likely secondary to infection, renal hydronephrosis appears chronic. Creatinine 2.69 on admission. No acute indication for dialysis. Would recommend Urology view obstruction. Appears dehydrated, will order gentle hydration and monitor for overload.  Normal saline at 50 mL/h  2. Anemia of chronic kidney disease Lab Results  Component Value Date   HGB 12.1 (L) 05/26/2022    Hgb at goal.   3. Hypertension with chronic kidney Home regimen includes amiodarone, metoprolol, and spironolactone.  Currently receiving amiodarone only.  4.  Cellulitis, right lower extremity.  Blood cultures positive for group strep A  bacteremia.  ID following.  Patient currently prescribed linezolid and penicillin G.  LOS: 2   5/3/20242:26 PM

## 2022-05-26 NOTE — Assessment & Plan Note (Signed)
CT abd/pelvis on admission 5/1 showed significant LEFT hydronephrosis and hydroureter down to bladder, no obstructing stone or mass seen. --Bladder scans --In/Out cath if retaining > 500 --Place Foley if multiple in/out caths --Will get Urology's input

## 2022-05-26 NOTE — Progress Notes (Signed)
Evening bladder scan showed .

## 2022-05-26 NOTE — Plan of Care (Signed)

## 2022-05-26 NOTE — Plan of Care (Signed)

## 2022-05-26 NOTE — Progress Notes (Signed)
Occupational Therapy Treatment Patient Details Name: Curtis Wagner MRN: [id number] DOB: 18-Mar-1940 Today's Date: 05/26/2022   History of present illness Curtis Wagner is an 82 y/o M with with MD assessment including: fall, RLE cellulitis, sepsis, and acute renal failure. Recent admission with d/c 04/17/22 after hospital stay for vascular surgery. Medical record indicates ED visit 05/05/22 for fall.  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   OT comments  Pt received semi-reclined in bed, in mitts, IV in R arm. Appearing lethargic; willing to work with OT on washing face. See flowsheet below for further details of session. Left semi-reclined, mitts on, with all needs in reach.  Patient will benefit from continued OT while in acute care.    Recommendations for follow up therapy are one component of a multi-disciplinary discharge planning process, led by the attending physician.  Recommendations may be updated based on patient status, additional functional criteria and insurance authorization.    Assistance Recommended at Discharge Frequent or constant Supervision/Assistance  Patient can return home with the following  Two people to help with walking and/or transfers;A lot of help with bathing/dressing/bathroom;Assistance with cooking/housework;Direct supervision/assist for medications management;Direct supervision/assist for financial management;Assist for transportation;Help with stairs or ramp for entrance   Equipment Recommendations  Other (comment) (defer)    Recommendations for Other Services      Precautions / Restrictions Precautions Precautions: Fall Restrictions Weight Bearing Restrictions: No       Mobility Bed Mobility               General bed mobility comments: Pt needing MAX A for bed mobility to scoot up in bed today in trendelenburg    Transfers                   General transfer comment: did not t/f today     Balance                                            ADL either performed or assessed with clinical judgement   ADL       Grooming: Minimal assistance;Wash/dry face;Bed level Grooming Details (indicate cue type and reason): pt unable to tolerate oral care today; needing assist to thoroughly wipe mouth from breakfast.                                    Extremity/Trunk Assessment Upper Extremity Assessment Upper Extremity Assessment: Generalized weakness   Lower Extremity Assessment Lower Extremity Assessment: Generalized weakness;Defer to PT evaluation        Vision       Perception     Praxis      Cognition Arousal/Alertness: Lethargic Behavior During Therapy: Restless, Agitated Overall Cognitive Status: No family/caregiver present to determine baseline cognitive functioning                                 General Comments: Pt appearing restless and wearing mitts when OT arrived. Pt getting more and more fatigued throughout session; closing eyes. Decreased participation today compared to yesterday. Per nursing, pt medicated before session which likely contributing to decreased participation.        Exercises      Shoulder Instructions  General Comments      Pertinent Vitals/ Pain       Pain Assessment Pain Assessment: No/denies pain  Home Living Family/patient expects to be discharged to:: Other (Comment)                                 Additional Comments: Per chart review pt lives at an ILF. Pt not able to provide any reliable history with no family/caregivers available to assist.      Prior Functioning/Environment              Frequency  Min 3X/week        Progress Toward Goals  OT Goals(current goals can now be found in the care plan section)  Progress towards OT goals: Progressing toward goals  Acute Rehab OT Goals Patient Stated Goal: not stated OT Goal Formulation: Patient unable to  participate in goal setting Time For Goal Achievement: 06/08/22 Potential to Achieve Goals: Fair ADL Goals Pt Will Perform Grooming: with modified independence;standing Pt Will Perform Lower Body Bathing: with modified independence;sitting/lateral leans Pt Will Perform Lower Body Dressing: with modified independence;sit to/from stand Pt Will Transfer to Toilet: with modified independence;bedside commode;ambulating Pt Will Perform Toileting - Clothing Manipulation and hygiene: with modified independence;sit to/from stand Pt Will Perform Tub/Shower Transfer: with min guard assist;ambulating;shower seat  Plan Discharge plan remains appropriate    Co-evaluation                 AM-PAC OT "6 Clicks" Daily Activity     Outcome Measure   Help from another person eating meals?: None Help from another person taking care of personal grooming?: A Lot Help from another person toileting, which includes using toliet, bedpan, or urinal?: A Lot Help from another person bathing (including washing, rinsing, drying)?: A Lot Help from another person to put on and taking off regular upper body clothing?: A Lot Help from another person to put on and taking off regular lower body clothing?: Total 6 Click Score: 13    End of Session    OT Visit Diagnosis: Unsteadiness on feet (R26.81);Repeated falls (R29.6);Muscle weakness (generalized) (M62.81);History of falling (Z91.81)   Activity Tolerance Patient limited by lethargy   Patient Left in bed;with call bell/phone within reach;with bed alarm set   Nurse Communication Mobility status        Time: 1610-9604 OT Time Calculation (min): 12 min  Charges: OT General Charges $OT Visit: 1 Visit OT Treatments $Self Care/Home Management : 8-22 mins  Linward Foster, MS, OTR/L   Alvester Morin 05/26/2022, 1:33 PM

## 2022-05-26 NOTE — Progress Notes (Addendum)
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 plate  Interval Events: Remains afebrile Remarkable for NA 129, CR 2.82, albumin 2.2, AST downtrending to 49.  WBC 10.8, platelets 123  Assessment 16 Y O male with PMH as below including HTN, HLD, PVD, CAD, CHF, CKD, Gout, Kidney stones PAF, DVT on AC brought to the ED with recurrent falls ongoing for several month s   # Invasive group A strep pyogenes bacteremia, concerns for TSS - in the setting of multiple falls going for several months per daughter and has wounds in the lower extremities related to fall and possibly the source  # Encephalopathy 2/2 above, able to tell name and place, follows some commands, confused  # AKI on CKD # Transaminitis improving  # Coagulopathy ( Thrombocytopenia,  elevated INR, aptt and INR)   # Diarrhea  improved  # Recurrent falls - per primary   Recommendations Continue penicillin G Continue Linezolid until 5/5  Fu repeat blood cx and sensi of strep pyogenes Complete 3 doses of IVIG Monitor CBC and CMP Fluid management per Primary  Dr Thedore Mins available remotely this weekend for questions.   Rest of the management as per the primary team. Thank you for the consult. Please page with pertinent questions or concerns.  ______________________________________________________________________ Subjective patient seen and examined at the bedside. Appears confused but able to tell name and place. Denies any concerns at his bilateral shoulders  Vitals BP 114/68 (BP Location: Left Arm)   Pulse 93   Temp 97.8 F (36.6 C)   Resp 19   Ht 5\' 11"  (1.803 m)   Wt 84 kg   SpO2 98%   BMI 25.83 kg/m   Physical Exam Constitutional: Elderly male sitting in the recliner    Comments: Superficial abrasion in the anterior forehead  Cardiovascular:     Rate and Rhythm: Normal rate and regular rhythm.     Heart sounds:  Pulmonary:     Effort: Pulmonary effort is normal.     Comments:   Abdominal:     Palpations: Abdomen is soft.     Tenderness: Nondistended  Musculoskeletal:        General: No swelling or tenderness in peripheral joints including bilateral shoulders  Skin:    Comments: Bilateral lower extremity swelling, healing wound in the right leg with minimal surrounding erythema.  Erythema  in the left leg improving  Neurological:     General: Awake, oriented to name and place follow  some basic commands  Pertinent Microbiology Results for orders placed or performed during the hospital encounter of 05/24/22  Blood Culture (routine x 2)     Status: None (Preliminary result)   Collection Time: 05/24/22  1:09 PM   Specimen: BLOOD  Result Value Ref Range Status   Specimen Description   Final    BLOOD BLOOD LEFT ARM Performed at [facility], [address],  [place], [place] [address]    Special Requests   Final    BOTTLES DRAWN AEROBIC AND ANAEROBIC Blood Culture results may not be optimal due to an excessive volume of blood received in culture bottles Performed at [facility], [address], [place], [place] [address]    Culture  Setup Time   Final    GRAM POSITIVE COCCI IN BOTH AEROBIC AND ANAEROBIC BOTTLES CRITICAL VALUE NOTED.  VALUE IS CONSISTENT WITH PREVIOUSLY REPORTED AND CALLED VALUE. Performed at [facility], [address] [place], [place] [address]    Culture   Final    Romie Minus POSITIVE COCCI IDENTIFICATION TO FOLLOW Performed at [facility], [address], [place], [place] [address]    Report Status PENDING  Incomplete  Blood Culture (routine x 2)     Status: Abnormal (Preliminary result)   Collection Time: 05/24/22  1:09 PM   Specimen: BLOOD  Result Value Ref Range Status   Specimen Description   Final    BLOOD BLOOD LEFT ARM Performed at [facility], [address], [place], [place] [address]    Special Requests   Final    BOTTLES DRAWN AEROBIC AND ANAEROBIC Blood Culture adequate volume Performed at [facility], [address] [place], [place] [address]    Culture  Setup Time   Final    GRAM POSITIVE COCCI IN BOTH AEROBIC AND ANAEROBIC BOTTLES CRITICAL RESULT CALLED TO, READ BACK BY AND VERIFIED WITH: NATAN BLUE@0428  05/25/22 RH    Culture (A)  Final    GROUP A STREP (S.PYOGENES) ISOLATED SUSCEPTIBILITIES TO FOLLOW HEALTH DEPARTMENT NOTIFIED Performed at [facility], [address], [place], [place] [address]    Report Status PENDING  Incomplete  Blood Culture ID Panel (Reflexed)     Status: Abnormal   Collection Time: 05/24/22  1:09 PM  Result Value Ref Range Status   Enterococcus faecalis NOT DETECTED NOT DETECTED Final   Enterococcus Faecium NOT DETECTED NOT DETECTED Final   Listeria monocytogenes NOT DETECTED NOT DETECTED Final   Staphylococcus  species NOT DETECTED NOT DETECTED Final   Staphylococcus aureus (BCID) NOT DETECTED NOT DETECTED Final   Staphylococcus epidermidis NOT DETECTED NOT DETECTED Final   Staphylococcus lugdunensis NOT DETECTED NOT DETECTED Final   Streptococcus species DETECTED (A) NOT DETECTED Final    Comment: CRITICAL RESULT CALLED TO, READ BACK BY AND VERIFIED WITH: NATHAN BLUE@0428  05/25/22 RH    Streptococcus agalactiae NOT DETECTED NOT DETECTED Final   Streptococcus pneumoniae NOT DETECTED NOT DETECTED Final   Streptococcus pyogenes DETECTED (A) NOT DETECTED Final    Comment: CRITICAL RESULT CALLED TO, READ BACK BY AND VERIFIED WITH: NATHAN BLUE@0428  05/25/22 RH    A.calcoaceticus-baumannii NOT DETECTED NOT DETECTED Final   Bacteroides fragilis NOT DETECTED NOT DETECTED Final   Enterobacterales NOT DETECTED NOT DETECTED Final   Enterobacter cloacae complex NOT DETECTED NOT DETECTED Final   Escherichia coli NOT DETECTED NOT DETECTED Final   Klebsiella aerogenes NOT DETECTED NOT DETECTED Final   Klebsiella oxytoca NOT DETECTED NOT  DETECTED Final   Klebsiella pneumoniae NOT DETECTED NOT DETECTED Final   Proteus species NOT DETECTED NOT DETECTED Final   Salmonella species NOT DETECTED NOT DETECTED Final   Serratia marcescens NOT DETECTED NOT DETECTED Final   Haemophilus influenzae NOT DETECTED NOT DETECTED Final   Neisseria meningitidis NOT DETECTED NOT DETECTED Final   Pseudomonas aeruginosa NOT DETECTED NOT DETECTED Final   Stenotrophomonas maltophilia NOT DETECTED NOT DETECTED Final   Candida albicans NOT DETECTED NOT DETECTED Final   Candida auris NOT DETECTED NOT DETECTED Final   Candida glabrata NOT DETECTED NOT DETECTED Final   Candida krusei NOT DETECTED NOT DETECTED Final   Candida parapsilosis NOT DETECTED NOT DETECTED Final   Candida tropicalis NOT DETECTED NOT DETECTED Final   Cryptococcus neoformans/gattii NOT DETECTED NOT DETECTED Final    Comment: Performed at [facility], [address], [place], [place] [address]   Pertinent Lab.    Latest Ref Rng & Units 05/26/2022    4:52 AM 05/25/2022    2:15 AM 05/24/2022    1:09 PM  CBC  WBC 4.0 - 10.5 K/uL 10.8  7.3  7.2   Hemoglobin 13.0 - 17.0 g/dL 91.4  78.2  95.6   Hematocrit 39.0 - 52.0 % 36.2  38.7  41.1   Platelets 150 - 400 K/uL 123  140  155       Latest Ref Rng & Units 05/26/2022    4:52 AM 05/25/2022    2:15 AM 05/24/2022    1:09 PM  CMP  Glucose 70 - 99 mg/dL 97  90  99   BUN 8 - 23 mg/dL 51  36  32   Creatinine 0.61 - 1.24 mg/dL 2.13  0.86  5.78   Sodium 135 - 145 mmol/L 129  130  134   Potassium 3.5 - 5.1 mmol/L 4.2  4.7  5.3   Chloride 98 - 111 mmol/L 100  105  103   CO2 22 - 32 mmol/L 22  15  18    Calcium 8.9 - 10.3 mg/dL 7.7  7.8  8.4   Total Protein 6.5 - 8.1 g/dL 6.1   6.1   Total Bilirubin 0.3 - 1.2 mg/dL 1.1   1.8   Alkaline Phos 38 - 126 U/L 64   89   AST 15 - 41 U/L 49   138   ALT 0 - 44 U/L 41   53      Pertinent Imaging today Plain films and CT images have been personally visualized and interpreted; radiology reports have been reviewed. Decision making incorporated into the Impression   No results found.   I have personally spent 52 minutes involved in face-to-face and non-face-to-face activities for this patient on the day of the visit. Professional time spent includes the following activities: Preparing to see the patient (review of tests), Obtaining and/or reviewing separately obtained history (admission/discharge record), Performing a medically appropriate examination and/or evaluation , Ordering medications/tests/procedures, referring and communicating with other health care professionals, Documenting clinical information in the EMR, Independently interpreting results (not separately reported), Communicating results to the patient/family/caregiver, Counseling and educating the patient/family/caregiver and Care coordination (not separately reported).   Plan d/w requesting provider as  well as ID pharm D  Note: This document was prepared using dragon voice recognition software and may include unintentional dictation errors.   Electronically signed by:   Odette Fraction, MD Infectious Disease Physician [facility] Pager: [phone]

## 2022-05-26 NOTE — Progress Notes (Signed)
Progress Note   Patient: Curtis Wagner ZOX:[id number] DOB: 01/14/1941 DOA: 05/24/2022     2 DOS: the patient was seen and examined on 05/26/2022   Brief hospital course: HPI on admission 05/24/22 by Dr. Clyde Lundborg: "Hamzah Antwine is a 82 y.o. 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   Per her daughter at the bedside, patient fell in the early morning at about 3 AM.  He is supposed to use [place], but did not use it. No loss of consciousness.  Patient has abrasion in forehead.  No unilateral numbness or tinglings in extremities.  No facial droop or slurred speech.  Patient does not have chest pain, cough, shortness of breath.  No nausea, vomiting or abdominal pain.  Patient has diarrhea in the past several days, several watery diarrhea each day.  No fever or chills.  No symptoms of UTI.  Patient reports right lower leg pain which has been going on for about 3 days, also has swelling and erythema in left lower leg.   Data reviewed independently and ED Course: pt was found to have WBC 7.2, BNP 2406, lactic acid 4.5, troponin level 22, 21, potassium 5.3, worsening renal function with creatinine 2.69, BUN 32 and GFR 23 (recent baseline creatinine 1.39 on 04/17/2022), abnormal liver function (ALP 89, AST 138, ALT 53, total bilirubin 1.8).  Temperature normal, soft blood pressure 86/61 which improved to 100/83 after giving 750 cc LR bolus in ED --> 98/70, heart rate 69 --> 103, RR 16-->23, oxygen saturation 98% on room air.  Chest x-ray negative.  Images are negative for acute injury including CT of head, CT of C-spine, x-ray of her bilateral ankles, x-ray of left forearm.  Right lower extremity venous Doppler negative for DVT but showed Baker's cyst.  Patient is admitted to telemetry bed as inpatient"   Further hospital course and management as outlined below.     Assessment and Plan: * Cellulitis of right lower extremity See Severe Sepsis  Acute metabolic encephalopathy Due to infection/sepsis, renal failure and other metabolic derangements, hospital environement 5/2 AM - nursing staff reported severe agitation, combativeness, pt pulled out his IV's was attempting to bite staff --Improved with 2.5 mg IV Valium --Avoiding antipsychotics w prolonged QTc --Continue PRN IV or PO Valium, minimize as much as possible --Mittens if needed to maintain IV access --Sitter if needed for safety --Delirium precautions  Severe sepsis (HCC) Due to RLE Cellulitis Strep pyogenes Bacteremia With tachycardia HR 103 and tachypnea RR 23.  No fever or leukocytosis.  Lactic acidosis, AKI, transaminitis consistent with organ dysfunction and severe sepsis.  --Continue IV PCN G and Clinda --ID is consulted, appreciate recommendations --Concern for Toxic Shock Syndrome with with AKI, transaminitis, coagulopathy, hypotension --ID started IVIG on 5/2 --Monitor volume status  --Follow cultures --Trend lactic acid, procal  Acute renal failure superimposed on stage 3a chronic kidney disease (HCC) Cr on admission 2.69 Baseline Cr 1.3-1.4 one month ago Suspect due to severe sepsis. At risk for cardiorenal syndrome given low EF. --Avoid using renal toxic medications --Hold spironolactone --Consulted Nephrology  Chronic systolic CHF (congestive heart failure) (HCC) Echo on 04/12/2022 showed EF<20%.  Despite elevated BNP 2406, no dypsnea or hypoxia, no pulm edema on chest x-ray. Overall compensated on admission. High risk of CHF decompensation. --Watch volume status closely --On gentle IV fluids per nephrology --Hold spironolactone due to AKI and  soft BP --Strict I/O's & Daily Wts --ID might be starting IVIG, will likely need some diuresis to offset  Hyponatremia Na on admission 134 >> 130 >> 129 Suspect Hypervolemic, given low EF and given sepsis  fluids --Monitor BMP --Daily weights, strict I/O's --On fluids for AKI per nephrology --Avoiding diuresis at this time to prevent worsening renal function  Diarrhea Follow stool studies Enteric precautions for now Monitor & replace electrolytes  Myocardial injury Due to demand ischemia in setting of severe sepsis.  No chest pain, EKG non-acute. Troponin 22 >> 21  Abnormal LFTs Possibly due to sepsis / ? Toxic shock synd given Strep bacteremia. Acute hepatitis panel negative. --Monitor LFT's  Elevated lactic acid level Due to severe sepsis. Lactate trend: 4.5 >> 3.4 >> 3.2 >> 2.9 .> 2.7 >> 2.6 Continue to trend lactate On gentle IV fluids per nephrology Watch volume & respiratory status closely (EF < 20%)  Hyperkalemia K was 5.3 on admission Resolved --Monitor BMP  PAD (peripheral artery disease) (HCC) Recent LLE re-vascularization in March admission with vascular surgery --On Eliquis, Lipitor --Follow up as scheduled with Vascular --Consult Vascular if acute issues develop  Fall at home, initial encounter PT/OT Daughter feels pt needs rehab and then ALF Fall precautions  Hypertension --Hold metoprolol due to soft BP --Hold spironolactone for soft BP, AKI --IV hydralazine as needed  PAF (paroxysmal atrial fibrillation) (HCC) Continue Eliquis, amiodarone Metoprolol on hold for soft BP's Telemetry  History of pulmonary embolism On Eliquis  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 --Bladder scans --In/Out cath if retaining > 500 --Place Foley if multiple in/out caths --Will get Urology's input  Prolonged QT interval Serial EKG's no monitor. Avoid QT prolonging medications including Haldol, Zofran Using Valium for agitation for  now Maintain K>4, Mg>2  Type 2 diabetes mellitus with stage 3b chronic kidney disease, without long-term current use of insulin (HCC) Hold Jardiance Start sliding scale Novolog if needed Monitor fasting glucose for now        Subjective: Pt was very agitated again overnight and the AM, had to be kept at nurses station for close supervision.  Ripped out IV again.  Given Valium with good effect.  Pt sitting up in bed, calm but confused on rounds.  He denies complaints.  Asks what kind of candy they want... appears talking to people not present in the room.    Physical Exam: Vitals:   05/25/22 1613 05/25/22 1633 05/26/22 0428 05/26/22 0851  BP: 107/81 98/63  114/68  Pulse: 94 88  93  Resp: 18   19  Temp:    97.8 F (36.6 C)  TempSrc:      SpO2:    98%  Weight:   84 kg   Height:       General exam: awake, alert, no acute distress HEENT: forehead abrasion and surrounding ecchymosis, moist mucus membranes, hearing grossly normal  Respiratory system: CTAB dimished bases, no wheezes, rales or rhonchi, normal respiratory effort. Cardiovascular system: RRR, Right > left lower extremity edema (2-3+ on right, 1+ on left).   Gastrointestinal system: soft, NT, ND, no HSM felt, +bowel sounds. Central nervous system: A&O x self. no gross focal neurologic deficits, normal speech Extremities: RLE anterior lower leg abrasion with surrounding erythema, warmth, significant RLE edema, less LLE edema,  moves all Skin: dry, intact, warmth and erythema of distal RLE Psychiatry: normal mood, congruent affect, abnormal judgement and insight     Data Reviewed:  Notable labs ---  Na 134 >> 130 >> 129 (likely hypervolemic) Bicarb 15 >> 22 BUN 36 >> 51 Cr 2.69 >> 2.82 Ca 7.7 Most recent lactate 2.6 Platelets 140k Acute hepatitis panel - negative   Micro --  Blood cultures + Strep pyogenes (group A strep) C diff - pending GI panel - pending  Family Communication: Daughter updated in person  5/1.  Will attempt to call.  Disposition: Status is: Inpatient Remains inpatient appropriate because: remains on IV therapies as above, severity of illness with multiple acute issues as outlined  Patient is very high risk for poor outcomes, morbidity and mortality given severity of his acute illness and multiple complicating underlying chronic comorbidities including HFrEF, severe PAD, CKD with worsening renal function.    Planned Discharge Destination: Skilled nursing facility    Time spent: 48 minutes   Author: Pennie Banter, DO 05/26/2022 1:37 PM  For on call review [email or web address].

## 2022-05-26 NOTE — Assessment & Plan Note (Addendum)
Serial EKG's no monitor. Avoid QT prolonging medications including Haldol, Zofran Using Valium for agitation for now Maintain K>4, Mg>2

## 2022-05-26 NOTE — Evaluation (Signed)
Physical Therapy Evaluation Patient Details Name: Curtis Wagner MRN: [id number] DOB: Oct 05, 1940 Today's Date: 05/26/2022  History of Present Illness  Matvey Santillana is an 82 y/o M with with MD assessment including: fall, RLE cellulitis, sepsis, and acute renal failure. Recent admission with d/c 04/17/22 after hospital stay for vascular surgery. Medical record indicates ED visit 05/05/22 for fall.  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   Clinical Impression  Pt was pleasant and motivated to participate during the session and put forth good effort throughout. Pt required frequent multi-modal cuing for proper sequencing with transfers and amb.  Pt presented with min to mod posterior lean in sitting and standing but improved with anterior weight shifting activities.  Ultimately pt was able to take several steps on two occasions with only min A for stability.  Pt reported no adverse symptoms during the session with SpO2 and HR WNL on room air.  Pt will benefit from continued PT services upon discharge to safely address deficits listed in patient problem list for decreased caregiver assistance and eventual return to PLOF.         Recommendations for follow up therapy are one component of a multi-disciplinary discharge planning process, led by the attending physician.  Recommendations may be updated based on patient status, additional functional criteria and insurance authorization.  Follow Up Recommendations Can patient physically be transported by private vehicle: No     Assistance Recommended at Discharge Frequent or constant Supervision/Assistance  Patient can return home with the following  Two people to help with walking and/or transfers;A lot of help with bathing/dressing/bathroom;Assistance with cooking/housework;Direct supervision/assist for medications management;Help with stairs or ramp for entrance;Assist for transportation    Equipment Recommendations Other  (comment) (TBD at next venue of care)  Recommendations for Other Services       Functional Status Assessment Patient has had a recent decline in their functional status and demonstrates the ability to make significant improvements in function in a reasonable and predictable amount of time.     Precautions / Restrictions Precautions Precautions: Fall Restrictions Weight Bearing Restrictions: No      Mobility  Bed Mobility               General bed mobility comments: NT, pt in recliner    Transfers Overall transfer level: Needs assistance Equipment used: Rolling Tupper (2 wheels) Transfers: Sit to/from Stand Sit to Stand: Mod assist, +2 physical assistance, Min assist           General transfer comment: Pt required +2 min to mod A to come to standing with max cuing and physical assist for BLE and BUE positioning as well as increased trunk flexion    Ambulation/Gait Ambulation/Gait assistance: Min assist, +2 safety/equipment Gait Distance (Feet): 3 Feet x 2 Assistive device: Rolling Botting (2 wheels) Gait Pattern/deviations: Decreased step length - right, Decreased step length - left, Step-to pattern, Trunk flexed, Shuffle Gait velocity: decreased     General Gait Details: Pt able to take several shuffling steps with min A and heavy lean on the RW for support, max verbal and tactile cuing for general sequencing  Stairs            Wheelchair Mobility    Modified Rankin (Stroke Patients Only)       Balance Overall balance assessment: Needs assistance Sitting-balance support: Feet supported Sitting balance-Leahy Scale: Fair     Standing balance support: Bilateral upper extremity supported, Reliant on assistive device for balance, During  functional activity Standing balance-Leahy Scale: Poor                               Pertinent Vitals/Pain Pain Assessment Pain Assessment: No/denies pain    Home Living Family/patient expects to be  discharged to:: Other (Comment)                   Additional Comments: Per chart review pt lives at an ILF. Pt not able to provide any reliable history with no family/caregivers available to assist.    Prior Function Prior Level of Function : Patient poor historian/Family not available                     Hand Dominance        Extremity/Trunk Assessment   Upper Extremity Assessment Upper Extremity Assessment: Generalized weakness    Lower Extremity Assessment Lower Extremity Assessment: Generalized weakness       Communication   Communication: No difficulties  Cognition Arousal/Alertness: Awake/alert Behavior During Therapy: WFL for tasks assessed/performed Overall Cognitive Status: No family/caregiver present to determine baseline cognitive functioning                                 General Comments: Pt oriented to self only, able to follow 1-step commands 50-75% of the time with extra time and cuing        General Comments      Exercises Other Exercises Other Exercises: anterior weight shifting in sitting and standing to address posterior lean   Assessment/Plan    PT Assessment Patient needs continued PT services  PT Problem List Decreased strength;Decreased activity tolerance;Decreased balance;Decreased mobility;Decreased knowledge of use of DME       PT Treatment Interventions DME instruction;Gait training;Functional mobility training;Therapeutic activities;Therapeutic exercise;Balance training;Patient/family education    PT Goals (Current goals can be found in the Care Plan section)  Acute Rehab PT Goals PT Goal Formulation: Patient unable to participate in goal setting Time For Goal Achievement: 06/08/22 Potential to Achieve Goals: Good    Frequency Min 2X/week     Co-evaluation               AM-PAC PT "6 Clicks" Mobility  Outcome Measure Help needed turning from your back to your side while in a flat bed  without using bedrails?: A Lot Help needed moving from lying on your back to sitting on the side of a flat bed without using bedrails?: A Lot Help needed moving to and from a bed to a chair (including a wheelchair)?: A Lot Help needed standing up from a chair using your arms (e.g., wheelchair or bedside chair)?: A Lot Help needed to walk in hospital room?: A Lot Help needed climbing 3-5 steps with a railing? : Total 6 Click Score: 11    End of Session Equipment Utilized During Treatment: Gait belt Activity Tolerance: Patient tolerated treatment well Patient left: in chair;with call bell/phone within reach;with nursing/sitter in room Nurse Communication: Mobility status;Other (comment) (No chair alarm in pt's recliner, nursing entered room at end of session to place alarm) PT Visit Diagnosis: Unsteadiness on feet (R26.81);History of falling (Z91.81);Muscle weakness (generalized) (M62.81);Difficulty in walking, not elsewhere classified (R26.2)    Time: 4098-1191 PT Time Calculation (min) (ACUTE ONLY): 20 min   Charges:   PT Evaluation $PT Eval Moderate Complexity: 1 Mod PT Treatments $Therapeutic Activity:  8-22 mins       D. Scott Jaiceon Collister PT, DPT 05/26/22, 11:50 AM

## 2022-05-27 DIAGNOSIS — L03115 Cellulitis of right lower limb: Secondary | ICD-10-CM | POA: Diagnosis not present

## 2022-05-27 LAB — CULTURE, BLOOD (ROUTINE X 2): Special Requests: ADEQUATE

## 2022-05-27 LAB — RENAL FUNCTION PANEL
Albumin: 1.9 g/dL — ABNORMAL LOW (ref 3.5–5.0)
Anion gap: 7 (ref 5–15)
BUN: 51 mg/dL — ABNORMAL HIGH (ref 8–23)
CO2: 16 mmol/L — ABNORMAL LOW (ref 22–32)
Calcium: 7.6 mg/dL — ABNORMAL LOW (ref 8.9–10.3)
Chloride: 103 mmol/L (ref 98–111)
Creatinine, Ser: 2.15 mg/dL — ABNORMAL HIGH (ref 0.61–1.24)
GFR, Estimated: 30 mL/min — ABNORMAL LOW (ref >60)
Glucose, Bld: 93 mg/dL (ref 70–99)
Phosphorus: 3.4 mg/dL (ref 2.5–4.6)
Potassium: 3.5 mmol/L (ref 3.5–5.1)
Sodium: 126 mmol/L — ABNORMAL LOW (ref 135–145)

## 2022-05-27 LAB — CBC
HCT: 31.8 % — ABNORMAL LOW (ref 39.0–52.0)
Hemoglobin: 10.9 g/dL — ABNORMAL LOW (ref 13.0–17.0)
MCH: 32.8 pg (ref 26.0–34.0)
MCHC: 34.3 g/dL (ref 30.0–36.0)
MCV: 95.8 fL (ref 80.0–100.0)
Platelets: 80 10*3/uL — ABNORMAL LOW (ref 150–400)
RBC: 3.32 MIL/uL — ABNORMAL LOW (ref 4.22–5.81)
RDW: 15.5 % (ref 11.5–15.5)
WBC: 7.7 10*3/uL (ref 4.0–10.5)
nRBC: 0 % (ref 0.0–0.2)

## 2022-05-27 LAB — LACTIC ACID, PLASMA: Lactic Acid, Venous: 1.3 mmol/L (ref 0.5–1.9)

## 2022-05-27 LAB — PROCALCITONIN: Procalcitonin: 2.2 ng/mL

## 2022-05-27 LAB — MAGNESIUM: Magnesium: 2 mg/dL (ref 1.7–2.4)

## 2022-05-27 MED ORDER — CHLORHEXIDINE GLUCONATE CLOTH 2 % EX PADS
6.0000 | MEDICATED_PAD | Freq: Every day | CUTANEOUS | Status: DC
  Administered 2022-05-27 – 2022-05-28 (×2): 6 via TOPICAL

## 2022-05-27 NOTE — Plan of Care (Signed)

## 2022-05-27 NOTE — Assessment & Plan Note (Signed)
Likely due to sepsis/infection. Platelets 140 >> 123 >> 80k --Monitor CBC daily --Remains on Eliquis and Plavix for now, monitor closely

## 2022-05-27 NOTE — Progress Notes (Signed)
Palliative consult received.  Curtis Wagner remains confused with intermittent agitation, unable to participate in GOC conversations. Called and spoke with daughter-[name], she is unable to visit today but plan has been set to meet tomorrow, 5/5 at 12 noon for GOC conversation.  No Charge.  Leeanne Deed, DNP, AGNP-C Palliative Medicine  Please call Palliative Medicine team phone with any questions [phone]. For individual providers please see AMION.

## 2022-05-27 NOTE — Progress Notes (Signed)
[facility]  ROUNDING NOTE   Subjective:   Patient sitting up in bed Alert, oriented to self only  Room air Lower extremity edema remains  Creatinine 2.15  Objective:  Vital signs in last 24 hours:  Temp:  [97.7 F (36.5 C)-98.5 F (36.9 C)] 98.5 F (36.9 C) (05/04 1031) Pulse Rate:  [81-99] 81 (05/04 1031) Resp:  [17-20] 17 (05/04 1031) BP: (88-116)/(61-82) 112/69 (05/04 1031) SpO2:  [95 %-100 %] 96 % (05/04 1031)  Weight change:  Filed Weights   05/24/22 1236 05/25/22 0500 05/26/22 0428  Weight: 82.6 kg 82.4 kg 84 kg    Intake/Output: I/O last 3 completed shifts: In: 1984.3 [I.V.:1734.3; IV Piggyback:250] Out: 1900 [Urine:1900]   Intake/Output this shift:  No intake/output data recorded.  Physical Exam: General: NAD, restless  Head: Normocephalic, atraumatic. Moist oral mucosal membranes  Eyes: Anicteric  Lungs:  Clear to auscultation, normal effort  Heart: Regular rate and rhythm  Abdomen:  Soft, nontender  Extremities:  1+ peripheral edema.  Neurologic: Nonfocal, moving all four extremities  Skin: No lesions, RLE erythema   Access: None    Basic Metabolic Panel: Recent Labs  Lab 05/24/22 1309 05/25/22 0215 05/26/22 0452 05/27/22 0502  NA 134* 130* 129* 126*  K 5.3* 4.7 4.2 3.5  CL 103 105 100 103  CO2 18* 15* 22 16*  GLUCOSE 99 90 97 93  BUN 32* 36* 51* 51*  CREATININE 2.69* 2.52* 2.82* 2.15*  CALCIUM 8.4* 7.8* 7.7* 7.6*  MG  --   --  1.9 2.0  PHOS  --   --   --  3.4    Liver Function Tests: Recent Labs  Lab 05/24/22 1309 05/26/22 0452 05/27/22 0502  AST 138* 49*  --   ALT 53* 41  --   ALKPHOS 89 64  --   BILITOT 1.8* 1.1  --   PROT 6.1* 6.1*  --   ALBUMIN 2.9* 2.2* 1.9*   No results for input(s): "LIPASE", "AMYLASE" in the last 168 hours. No results for input(s): "AMMONIA" in the last 168 hours.  CBC: Recent Labs  Lab 05/24/22 1309 05/25/22 0215 05/26/22 0452 05/27/22 0613  WBC 7.2 7.3 10.8* 7.7   NEUTROABS 6.4  --   --   --   HGB 13.4 13.0 12.1* 10.9*  HCT 41.1 38.7* 36.2* 31.8*  MCV 100.5* 98.5 97.8 95.8  PLT 155 140* 123* 80*    Cardiac Enzymes: Recent Labs  Lab 05/24/22 1309  CKTOTAL 93    BNP: Invalid input(s): "POCBNP"  CBG: No results for input(s): "GLUCAP" in the last 168 hours.  Microbiology: Results for orders placed or performed during the hospital encounter of 05/24/22  Blood Culture (routine x 2)     Status: Abnormal (Preliminary result)   Collection Time: 05/24/22  1:09 PM   Specimen: BLOOD  Result Value Ref Range Status   Specimen Description   Final    BLOOD BLOOD LEFT ARM Performed at [facility], [address] [place], [place] [address]    Special Requests   Final    BOTTLES DRAWN AEROBIC AND ANAEROBIC Blood Culture results may not be optimal due to an excessive volume of blood received in culture bottles Performed at [facility], [address] [place], [place] [address]    Culture  Setup Time   Final    GRAM POSITIVE COCCI IN BOTH AEROBIC AND ANAEROBIC BOTTLES CRITICAL VALUE NOTED.  VALUE IS CONSISTENT WITH PREVIOUSLY REPORTED AND CALLED  VALUE. Performed at [facility], [address], [place], [place] [address]    Culture (A)  Final    GROUP A STREP (S.PYOGENES) ISOLATED SUSCEPTIBILITIES PERFORMED ON PREVIOUS CULTURE WITHIN THE LAST 5 DAYS. Performed at [facility], [address], [place], [place] [address]    Report Status PENDING  Incomplete  Blood Culture (routine x 2)     Status: Abnormal (Preliminary result)   Collection Time: 05/24/22  1:09 PM   Specimen: BLOOD  Result Value Ref Range Status   Specimen Description   Final    BLOOD BLOOD LEFT ARM Performed at [id number], [address] [place], [place] [address]    Special Requests   Final    BOTTLES DRAWN AEROBIC AND ANAEROBIC Blood Culture adequate volume Performed at [facility], [address]  [place], [place] [address]    Culture  Setup Time   Final    GRAM POSITIVE COCCI IN BOTH AEROBIC AND ANAEROBIC BOTTLES CRITICAL RESULT CALLED TO, READ BACK BY AND VERIFIED WITH: NATAN BLUE@0428  05/25/22 RH    Culture (A)  Final    STREPTOCOCCUS PYOGENES HEALTH DEPARTMENT NOTIFIED Performed at [facility], [address], [place], [place] [address]    Report Status PENDING  Incomplete   Organism ID, Bacteria STREPTOCOCCUS PYOGENES  Final      Susceptibility   Streptococcus pyogenes - MIC*    PENICILLIN <=0.06 SENSITIVE Sensitive     CEFTRIAXONE <=0.12 SENSITIVE Sensitive     ERYTHROMYCIN <=0.12 SENSITIVE Sensitive     LEVOFLOXACIN 0.5 SENSITIVE Sensitive     VANCOMYCIN 0.5 SENSITIVE Sensitive     * STREPTOCOCCUS PYOGENES  Blood Culture ID Panel (Reflexed)     Status: Abnormal   Collection Time: 05/24/22  1:09 PM  Result Value Ref Range Status   Enterococcus faecalis NOT DETECTED NOT DETECTED Final   Enterococcus Faecium NOT DETECTED NOT DETECTED Final   Listeria monocytogenes NOT DETECTED NOT DETECTED Final   Staphylococcus species NOT DETECTED NOT DETECTED Final   Staphylococcus aureus (BCID) NOT DETECTED NOT DETECTED Final   Staphylococcus epidermidis NOT DETECTED NOT DETECTED Final   Staphylococcus lugdunensis NOT DETECTED NOT DETECTED Final   Streptococcus species DETECTED (A) NOT DETECTED Final    Comment: CRITICAL RESULT CALLED TO, READ BACK BY AND VERIFIED WITH: NATHAN BLUE@0428  05/25/22 RH    Streptococcus agalactiae NOT DETECTED NOT DETECTED Final   Streptococcus pneumoniae NOT DETECTED NOT DETECTED Final   Streptococcus pyogenes DETECTED (A) NOT DETECTED Final    Comment: CRITICAL RESULT CALLED TO, READ BACK BY AND VERIFIED WITH: NATHAN BLUE@0428  05/25/22 RH    A.calcoaceticus-baumannii NOT DETECTED NOT DETECTED Final   Bacteroides fragilis NOT DETECTED NOT DETECTED Final   Enterobacterales NOT DETECTED NOT DETECTED Final   Enterobacter cloacae complex NOT DETECTED  NOT DETECTED Final   Escherichia coli NOT DETECTED NOT DETECTED Final   Klebsiella aerogenes NOT DETECTED NOT DETECTED Final   Klebsiella oxytoca NOT DETECTED NOT DETECTED Final   Klebsiella pneumoniae NOT DETECTED NOT DETECTED Final   Proteus species NOT DETECTED NOT DETECTED Final   Salmonella species NOT DETECTED NOT DETECTED Final   Serratia marcescens NOT DETECTED NOT DETECTED Final   Haemophilus influenzae NOT DETECTED NOT DETECTED Final   Neisseria meningitidis NOT DETECTED NOT DETECTED Final   Pseudomonas aeruginosa NOT DETECTED NOT DETECTED Final   Stenotrophomonas maltophilia NOT DETECTED NOT DETECTED Final   Candida albicans NOT DETECTED NOT DETECTED Final   Candida  auris NOT DETECTED NOT DETECTED Final   Candida glabrata NOT DETECTED NOT DETECTED Final   Candida krusei NOT DETECTED NOT DETECTED Final   Candida parapsilosis NOT DETECTED NOT DETECTED Final   Candida tropicalis NOT DETECTED NOT DETECTED Final   Cryptococcus neoformans/gattii NOT DETECTED NOT DETECTED Final    Comment: Performed at [facility], [address] [place], [place] [address]  Culture, blood (Routine X 2) w Reflex to ID Panel     Status: None (Preliminary result)   Collection Time: 05/26/22  4:52 AM   Specimen: BLOOD  Result Value Ref Range Status   Specimen Description BLOOD  LEFT ARM  Final   Special Requests   Final    BOTTLES DRAWN AEROBIC AND ANAEROBIC Blood Culture results may not be optimal due to an inadequate volume of blood received in culture bottles   Culture   Final    NO GROWTH < 24 HOURS Performed at [facility], [address], [place], [place] [address]    Report Status PENDING  Incomplete  Culture, blood (Routine X 2) w Reflex to ID Panel     Status: None (Preliminary result)   Collection Time: 05/26/22  4:58 AM   Specimen: BLOOD  Result Value Ref Range Status   Specimen Description BLOOD  RIGHT ARM  Final   Special Requests   Final    BOTTLES DRAWN  AEROBIC AND ANAEROBIC Blood Culture adequate volume   Culture   Final    NO GROWTH < 24 HOURS Performed at [facility], [address] [place], [place] [address]    Report Status PENDING  Incomplete    Coagulation Studies: Recent Labs    05/24/22 1309  LABPROT 24.6*  INR 2.2*    Urinalysis: No results for input(s): "COLORURINE", "LABSPEC", "PHURINE", "GLUCOSEU", "HGBUR", "BILIRUBINUR", "KETONESUR", "PROTEINUR", "UROBILINOGEN", "NITRITE", "LEUKOCYTESUR" in the last 72 hours.  Invalid input(s): "APPERANCEUR"    Imaging: No results found.   Medications:    sodium chloride 50 mL/hr at 05/27/22 9147   Immune Globulin 10%     pencillin G potassium IV 3 Million Units (05/27/22 0537)    amiodarone  200 mg Oral BID   apixaban  5 mg Oral BID   atorvastatin  80 mg Oral Daily   clopidogrel  75 mg Oral Daily   escitalopram  20 mg Oral Daily   linezolid  600 mg Oral Q12H   multivitamin with minerals  1 tablet Oral Daily   tamsulosin  0.4 mg Oral QHS   acetaminophen, acetaminophen, albuterol, diazepam, diazepam, diphenhydrAMINE, hydrALAZINE, oxyCODONE  Assessment/ Plan:  Mr. Shedric Iadarola is a 82 y.o.  male  with past medical problems include hypertension, PVD, CAD, CHF, atrial fibrillation, and chronic kidney disease stage IIIa, who was admitted to [facility] on 05/24/2022 for Cellulitis of right lower extremity [L03.115] Injury of head, initial encounter [S09.90XA] Fall, initial encounter [W19.XXXA] Fall at home, initial encounter [phone].XXXA, Y92.009] Hypotension, unspecified hypotension type [I95.9]   Acute kidney injury on chronic kidney disease stage IIIa. Baseline creatinine 1.30 with GFR 54 on 04/13/22. CT abd pelvis shows a significant left-sided hydronephrosis with hydroureter down to bladder without obstructing calculus or mass and 12 mm left renal calculus present. Acute kidney injury likely secondary to infection, renal hydronephrosis appears chronic. Creatinine  2.69 on admission. No acute indication for dialysis. Would recommend Urology view obstruction.  Creatinine has improved with IVF. Would recommend 1 more day of IVF. Monitoring fluid status.   Lab Results  Component Value Date   CREATININE 2.15 (H) 05/27/2022   CREATININE 2.82 (H) 05/26/2022   CREATININE 2.52 (H) 05/25/2022    Intake/Output Summary (Last 24 hours) at 05/27/2022 1050 Last data filed at 05/27/2022 0540 Gross per 24 hour  Intake 1134.29 ml  Output 1700 ml  Net -565.71 ml    2. Anemia of chronic kidney disease Lab Results  Component Value Date   HGB 10.9 (L) 05/27/2022    Hgb within desired range  3. Hypertension with chronic kidney Home regimen includes amiodarone, metoprolol, and spironolactone.  Currently receiving amiodarone only. Blood pressure stable   4.  Cellulitis, right lower extremity.  Blood cultures positive for group strep A bacteremia.  ID following.  Patient currently prescribed linezolid and penicillin G. Due to volume concerns, order placed to hold IVF while receiving penicillin G.   LOS: 3   5/4/202410:50 AM

## 2022-05-27 NOTE — Progress Notes (Addendum)
Progress Note   Patient: Ramy Flint ZOX:[id number] DOB: 10/02/1940 DOA: 05/24/2022     3 DOS: the patient was seen and examined on 05/27/2022   Brief hospital course: HPI on admission 05/24/22 by Dr. Clyde Lundborg: "Aamer Barricklow is a 82 y.o. 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   Per her daughter at the bedside, patient fell in the early morning at about 3 AM.  He is supposed to use Hamidi, but did not use it. No loss of consciousness.  Patient has abrasion in forehead.  No unilateral numbness or tinglings in extremities.  No facial droop or slurred speech.  Patient does not have chest pain, cough, shortness of breath.  No nausea, vomiting or abdominal pain.  Patient has diarrhea in the past several days, several watery diarrhea each day.  No fever or chills.  No symptoms of UTI.  Patient reports right lower leg pain which has been going on for about 3 days, also has swelling and erythema in left lower leg.   Data reviewed independently and ED Course: pt was found to have WBC 7.2, BNP 2406, lactic acid 4.5, troponin level 22, 21, potassium 5.3, worsening renal function with creatinine 2.69, BUN 32 and GFR 23 (recent baseline creatinine 1.39 on 04/17/2022), abnormal liver function (ALP 89, AST 138, ALT 53, total bilirubin 1.8).  Temperature normal, soft blood pressure 86/61 which improved to 100/83 after giving 750 cc LR bolus in ED --> 98/70, heart rate 69 --> 103, RR 16-->23, oxygen saturation 98% on room air.  Chest x-ray negative.  Images are negative for acute injury including CT of head, CT of C-spine, x-ray of her bilateral ankles, x-ray of left forearm.  Right lower extremity venous Doppler negative for DVT but showed Baker's cyst.  Patient is admitted to telemetry bed as inpatient"   Further hospital course and management as outlined below.     Assessment and Plan: * Severe sepsis (HCC) Due to RLE Cellulitis Strep pyogenes Bacteremia With tachycardia HR 103 and tachypnea RR 23.  No fever or leukocytosis.  Lactic acidosis, AKI, transaminitis consistent with organ dysfunction and severe sepsis.  --Continue IV PCN G and Clinda --ID is consulted, appreciate recommendations --Concern for Toxic Shock Syndrome with with AKI, transaminitis, coagulopathy, hypotension --ID started IVIG on 5/2 --Monitor volume status  --Follow cultures --Trend lactic acid, procal  Acute metabolic encephalopathy Due to infection/sepsis, renal failure and other metabolic derangements, hospital environement 5/2 AM - nursing staff reported severe agitation, combativeness, pt pulled out his IV's was attempting to bite staff --Improved with 2.5 mg IV Valium --Avoiding antipsychotics w prolonged QTc --Continue PRN IV or PO Valium, minimize as much as possible --Mittens if needed to maintain IV access --Sitter if needed for safety --Delirium precautions  Acute renal failure superimposed on stage 3a chronic kidney disease (HCC) Cr on admission 2.69 Baseline Cr 1.3-1.4 one month ago Suspect due to severe sepsis. At risk for cardiorenal syndrome given low EF. --Avoid using renal toxic medications --Hold spironolactone --Consulted Nephrology  Cellulitis of right lower extremity See Severe Sepsis  Chronic systolic CHF (congestive heart failure) (HCC) Echo on 04/12/2022 showed EF<20%.  Despite elevated BNP 2406, no dypsnea or hypoxia, no pulm edema on chest x-ray. Overall compensated on admission. High risk of CHF decompensation. --Watch volume status closely --On gentle IV fluids per nephrology --Hold spironolactone due to AKI and  soft BP --Strict I/O's & Daily Wts --ID might be starting IVIG, will likely need some diuresis to offset  Hyponatremia Na on admission 134 >> 130 >> 129 >> 126, progressively worsening on fluids and  IVIG. Hypervolemic --Monitor BMP --Daily weights, strict I/O's --On fluids for AKI per nephrology --Avoiding diuresis at this time to prevent worsening renal function  Diarrhea Follow stool studies Enteric precautions for now Monitor & replace electrolytes  Myocardial injury Due to demand ischemia in setting of severe sepsis.  No chest pain, EKG non-acute. Troponin 22 >> 21  Abnormal LFTs Possibly due to sepsis / ? Toxic shock synd given Strep bacteremia. Acute hepatitis panel negative. --Monitor LFT's  Elevated lactic acid level Due to severe sepsis. Lactate normalized 1.3 (on 5/4) Continue to trend lactate On gentle IV fluids per nephrology Watch volume & respiratory status closely (EF < 20%)  Hyperkalemia K was 5.3 on admission Resolved --Monitor BMP  PAD (peripheral artery disease) (HCC) Recent LLE re-vascularization in March admission with vascular surgery --On Eliquis, Lipitor --Follow up as scheduled with Vascular --Consult Vascular if acute issues develop  Fall at home, initial encounter PT/OT Needs SNF/rehab at d/c [facility] following for placement Fall precautions   Hypertension --Hold metoprolol due to soft BP --Hold spironolactone for soft BP, AKI --IV hydralazine as needed  PAF (paroxysmal atrial fibrillation) (HCC) Continue Eliquis, amiodarone Metoprolol on hold for soft BP's Telemetry  History of pulmonary embolism On Eliquis  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 --Bladder scans show ongoing retention --Place Foley --Urology follow up  Prolonged QT interval Serial EKG's no monitor. Avoid QT prolonging medications including Haldol, Zofran Using Valium for agitation for now Maintain  K>4, Mg>2  Thrombocytopenia (HCC) Likely due to sepsis/infection. Platelets 140 >> 123 >> 80k --Monitor CBC daily --Remains on Eliquis and Plavix for now, monitor closely  Type 2 diabetes mellitus with stage 3b chronic kidney disease, without long-term current use of insulin (HCC) Hold Jardiance Start sliding scale Novolog if needed Monitor fasting glucose for now        Subjective: Pt was sleeping when seen initially this AM.  Returned to bedside to update son this afternoon, pt awake, more talkative.  Calm and appropriate.  Pt states he is feeling better. Lunch tray arrived, pt states he is not hungry.  No other acute complaints.    Physical Exam: Vitals:   05/26/22 1623 05/27/22 0006 05/27/22 0133 05/27/22 1031  BP: 116/77 (!) 88/66 101/65 112/69  Pulse: 88 87 85 81  Resp:  20  17  Temp: 97.8 F (36.6 C) 98.2 F (36.8 C)  98.5 F (36.9 C)  TempSrc: Oral     SpO2: 97% 100%  96%  Weight:      Height:       General exam: awake, alert, no acute distress HEENT: forehead and nasal bridge abrasion and surrounding ecchymosis, moist mucus membranes, hearing grossly normal  Respiratory system: CTAB dimished bases, no wheezes, rales or rhonchi, normal respiratory effort. Cardiovascular system: RRR, Right > left lower extremity edema (2-3+ on right, 1+ on left).   Gastrointestinal system: soft, NT, ND, no HSM felt, +bowel sounds. Central nervous system: A&O x self. no gross focal neurologic deficits, normal speech Extremities: RLE anterior lower leg abrasion with worsened erythema, worsening  RLE edema, increasing LLE edema, mittens on hands Skin: dry, intact, warmth and erythema of distal RLE Psychiatry: normal mood, congruent affect, abnormal judgement and insight     Data Reviewed:  Notable labs ---  Na 134 >> 130 >> 129 >> 126 (hypervolemic) Bicarb 15 >> 22 BUN 36 >> 51 Cr 2.69 >> 2.82 >> 2.15 Ca 7.6 Most recent lactate 1.3 normalized Platelets 123 >> 80 k Procal  improving 2.20   Acute hepatitis panel - negative   Micro --  Blood cultures + Strep pyogenes (group A strep) - senistive to PCN, CTX, Levaquin, Vanc, erythromycin C diff - cancelled, diarrhea resolved GI panel - cancelled, diarrhea resolved  Family Communication: Son updated at bedside this afternoon (5/4)  Disposition: Status is: Inpatient Remains inpatient appropriate because: remains on IV therapies as above, severity of illness with multiple acute issues as outlined  Patient is very high risk for poor outcomes, morbidity and mortality given severity of his acute illness and multiple complicating underlying chronic comorbidities including HFrEF, severe PAD, CKD with worsening renal function.    Planned Discharge Destination: Skilled nursing facility    Time spent: 42 minutes   Author: Pennie Banter, DO 05/27/2022 1:42 PM  For on call review [email or web address].

## 2022-05-28 DIAGNOSIS — Z7189 Other specified counseling: Secondary | ICD-10-CM

## 2022-05-28 DIAGNOSIS — R6520 Severe sepsis without septic shock: Secondary | ICD-10-CM | POA: Diagnosis not present

## 2022-05-28 DIAGNOSIS — N179 Acute kidney failure, unspecified: Secondary | ICD-10-CM | POA: Diagnosis not present

## 2022-05-28 DIAGNOSIS — L03115 Cellulitis of right lower limb: Secondary | ICD-10-CM | POA: Diagnosis not present

## 2022-05-28 DIAGNOSIS — A419 Sepsis, unspecified organism: Secondary | ICD-10-CM

## 2022-05-28 DIAGNOSIS — G9341 Metabolic encephalopathy: Secondary | ICD-10-CM | POA: Diagnosis not present

## 2022-05-28 LAB — RENAL FUNCTION PANEL
Albumin: 1.9 g/dL — ABNORMAL LOW (ref 3.5–5.0)
Anion gap: 5 (ref 5–15)
BUN: 46 mg/dL — ABNORMAL HIGH (ref 8–23)
CO2: 18 mmol/L — ABNORMAL LOW (ref 22–32)
Calcium: 7.6 mg/dL — ABNORMAL LOW (ref 8.9–10.3)
Chloride: 106 mmol/L (ref 98–111)
Creatinine, Ser: 1.76 mg/dL — ABNORMAL HIGH (ref 0.61–1.24)
GFR, Estimated: 38 mL/min — ABNORMAL LOW (ref >60)
Glucose, Bld: 99 mg/dL (ref 70–99)
Phosphorus: 3 mg/dL (ref 2.5–4.6)
Potassium: 3.6 mmol/L (ref 3.5–5.1)
Sodium: 129 mmol/L — ABNORMAL LOW (ref 135–145)

## 2022-05-28 LAB — CBC
HCT: 32.1 % — ABNORMAL LOW (ref 39.0–52.0)
Hemoglobin: 10.9 g/dL — ABNORMAL LOW (ref 13.0–17.0)
MCH: 32.5 pg (ref 26.0–34.0)
MCHC: 34 g/dL (ref 30.0–36.0)
MCV: 95.8 fL (ref 80.0–100.0)
Platelets: 72 10*3/uL — ABNORMAL LOW (ref 150–400)
RBC: 3.35 MIL/uL — ABNORMAL LOW (ref 4.22–5.81)
RDW: 15.4 % (ref 11.5–15.5)
WBC: 5.4 10*3/uL (ref 4.0–10.5)
nRBC: 0 % (ref 0.0–0.2)

## 2022-05-28 LAB — CULTURE, BLOOD (ROUTINE X 2): Special Requests: ADEQUATE

## 2022-05-28 MED ORDER — MORPHINE SULFATE (CONCENTRATE) 10 MG/0.5ML PO SOLN: 5.0000 mg | ORAL | Status: DC | PRN

## 2022-05-28 MED ORDER — ACETAMINOPHEN 650 MG RE SUPP: 650.0000 mg | Freq: Four times a day (QID) | RECTAL | Status: DC | PRN

## 2022-05-28 MED ORDER — GLYCOPYRROLATE 0.2 MG/ML IJ SOLN: 0.2000 mg | INTRAMUSCULAR | Status: DC | PRN

## 2022-05-28 MED ORDER — BIOTENE DRY MOUTH MT LIQD: 15.0000 mL | OROMUCOSAL | Status: DC | PRN

## 2022-05-28 MED ORDER — ACETAMINOPHEN 325 MG PO TABS
650.0000 mg | ORAL_TABLET | Freq: Four times a day (QID) | ORAL | Status: DC | PRN
  Administered 2022-05-31 – 2022-06-07 (×8): 650 mg via ORAL
  Filled 2022-05-28 (×8): qty 2

## 2022-05-28 MED ORDER — POLYVINYL ALCOHOL 1.4 % OP SOLN: 1.0000 [drp] | Freq: Four times a day (QID) | OPHTHALMIC | Status: DC | PRN

## 2022-05-28 MED ORDER — GLYCOPYRROLATE 1 MG PO TABS: 1.0000 mg | ORAL_TABLET | ORAL | Status: DC | PRN

## 2022-05-28 NOTE — Progress Notes (Signed)
Progress Note   Patient: Curtis Wagner WUJ:[id number] DOB: 1940/05/05 DOA: 05/24/2022     4 DOS: the patient was seen and examined on 05/28/2022   Brief hospital course: HPI on admission 05/24/22 by Dr. Clyde Lundborg: "Curtis Wagner is a 82 y.o. 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   Per her daughter at the bedside, patient fell in the early morning at about 3 AM.  He is supposed to use Duerr, but did not use it. No loss of consciousness.  Patient has abrasion in forehead.  No unilateral numbness or tinglings in extremities.  No facial droop or slurred speech.  Patient does not have chest pain, cough, shortness of breath.  No nausea, vomiting or abdominal pain.  Patient has diarrhea in the past several days, several watery diarrhea each day.  No fever or chills.  No symptoms of UTI.  Patient reports right lower leg pain which has been going on for about 3 days, also has swelling and erythema in left lower leg.   Data reviewed independently and ED Course: pt was found to have WBC 7.2, BNP 2406, lactic acid 4.5, troponin level 22, 21, potassium 5.3, worsening renal function with creatinine 2.69, BUN 32 and GFR 23 (recent baseline creatinine 1.39 on 04/17/2022), abnormal liver function (ALP 89, AST 138, ALT 53, total bilirubin 1.8).  Temperature normal, soft blood pressure 86/61 which improved to 100/83 after giving 750 cc LR bolus in ED --> 98/70, heart rate 69 --> 103, RR 16-->23, oxygen saturation 98% on room air.  Chest x-ray negative.  Images are negative for acute injury including CT of head, CT of C-spine, x-ray of her bilateral ankles, x-ray of left forearm.  Right lower extremity venous Doppler negative for DVT but showed Baker's cyst.  Patient is admitted to telemetry bed as inpatient"   Further hospital course and management as outlined below.     Assessment and Plan: * Severe sepsis (HCC) Due to RLE Cellulitis Strep pyogenes Bacteremia With tachycardia HR 103 and tachypnea RR 23.  No fever or leukocytosis.  Lactic acidosis, AKI, transaminitis consistent with organ dysfunction and severe sepsis.  --Continue IV PCN G and Clinda --ID is consulted, appreciate recommendations --Concern for Toxic Shock Syndrome with with AKI, transaminitis, coagulopathy, hypotension --ID started IVIG on 5/2 --Monitor volume status  --Follow cultures --Trend lactic acid, procal  Acute metabolic encephalopathy Due to infection/sepsis, renal failure and other metabolic derangements, hospital environement 5/2 AM - nursing staff reported severe agitation, combativeness, pt pulled out his IV's was attempting to bite staff --Improved with 2.5 mg IV Valium --Avoiding antipsychotics w prolonged QTc --Continue PRN IV or PO Valium, minimize as much as possible --Mittens if needed to maintain IV access --Sitter if needed for safety --Delirium precautions  Acute renal failure superimposed on stage 3a chronic kidney disease (HCC) Cr on admission 2.69 Baseline Cr 1.3-1.4 one month ago Suspect due to severe sepsis. At risk for cardiorenal syndrome given low EF. --Avoid using renal toxic medications --Hold spironolactone --Consulted Nephrology  Cellulitis of right lower extremity See Severe Sepsis  Chronic systolic CHF (congestive heart failure) (HCC) Echo on 04/12/2022 showed EF<20%.  Despite elevated BNP 2406, no dypsnea or hypoxia, no pulm edema on chest x-ray. Overall compensated on admission. High risk of CHF decompensation. --Watch volume status closely --On gentle IV fluids per nephrology --Hold spironolactone due to AKI and  soft BP --Strict I/O's & Daily Wts --ID might be starting IVIG, will likely need some diuresis to offset  Hyponatremia Na on admission 134 >> 130 >> 129 >> 126, progressively worsening on fluids and  IVIG. Hypervolemic --Monitor BMP --Daily weights, strict I/O's --On fluids for AKI per nephrology --Avoiding diuresis at this time to prevent worsening renal function  Diarrhea Follow stool studies Enteric precautions for now Monitor & replace electrolytes  Myocardial injury Due to demand ischemia in setting of severe sepsis.  No chest pain, EKG non-acute. Troponin 22 >> 21  Abnormal LFTs Possibly due to sepsis / ? Toxic shock synd given Strep bacteremia. Acute hepatitis panel negative. --Monitor LFT's  Elevated lactic acid level Due to severe sepsis. Lactate normalized 1.3 (on 5/4) Continue to trend lactate On gentle IV fluids per nephrology Watch volume & respiratory status closely (EF < 20%)  Hyperkalemia K was 5.3 on admission Resolved --Monitor BMP  PAD (peripheral artery disease) (HCC) Recent LLE re-vascularization in March admission with vascular surgery --On Eliquis, Lipitor --Follow up as scheduled with Vascular --Consult Vascular if acute issues develop  Fall at home, initial encounter PT/OT Needs SNF/rehab at d/c [facility] following for placement Fall precautions   Hypertension --Hold metoprolol due to soft BP --Hold spironolactone for soft BP, AKI --IV hydralazine as needed  PAF (paroxysmal atrial fibrillation) (HCC) Continue Eliquis, amiodarone Metoprolol on hold for soft BP's Telemetry  History of pulmonary embolism On Eliquis  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 --Bladder scans show ongoing retention --Place Foley --Urology follow up  Prolonged QT interval Serial EKG's no monitor. Avoid QT prolonging medications including Haldol, Zofran Using Valium for agitation for now Maintain  K>4, Mg>2  Thrombocytopenia (HCC) Likely due to sepsis/infection. Platelets 140 >> 123 >> 80k --Monitor CBC daily --Remains on Eliquis and Plavix for now, monitor closely  Type 2 diabetes mellitus with stage 3b chronic kidney disease, without long-term current use of insulin (HCC) Hold Jardiance Start sliding scale Novolog if needed Monitor fasting glucose for now        Subjective: Pt was sleeping when seen initially this AM.  Returned to bedside to update son this afternoon, pt awake, more talkative.  Calm and appropriate.  Pt states he is feeling better. Lunch tray arrived, pt states he is not hungry.  No other acute complaints.    Physical Exam: Vitals:   05/27/22 1500 05/27/22 2344 05/28/22 0000 05/28/22 0802  BP: 104/78 114/71  106/75  Pulse: 84 83 83 85  Resp: 18 20  18   Temp: 97.7 F (36.5 C) (!) 97.4 F (36.3 C)  (!) 97.4 F (36.3 C)  TempSrc: Oral     SpO2: 100% 92% 100% 99%  Weight:      Height:       General exam: sleeping comfortably, no acute distress HEENT: forehead and nasal bridge abrasion and surrounding ecchymosis, moist mucus membranes, hearing grossly normal  Respiratory system: CTAB dimished bases, no wheezes, rales or rhonchi, normal respiratory effort. Cardiovascular system: RRR, Increased Right > left lower extremity edema (3+ on right, 1+ on left).   Gastrointestinal system: soft, NT, ND Central nervous system: exam limited by somnolence Extremities: RLE anterior lower leg abrasion with worsening RLE edema, increasing LLE edema, mittens on hands Skin: dry, intact,  warmth and erythema of distal RLE Psychiatry: exam limited by somnolence    Data Reviewed:  Notable labs ---  Na 134 >> 130 >> 129 >> 126 >> 129 (hypervolemic) Bicarb 16 >> 18 BUN 46 Cr 2.69 >> 2.82 >> 2.15 >> 1.76 Ca 7.6 Platelets 123 >> 80 >> 72k   Acute hepatitis panel - negative   Micro --  Blood cultures + Strep pyogenes (group A strep) - senistive to PCN, CTX,  Levaquin, Vanc, erythromycin C diff - cancelled, diarrhea resolved GI panel - cancelled, diarrhea resolved  Family Communication: Son updated at bedside afternoon 5/4.  None present on rounds this AM.  Disposition: Status is: Inpatient Remains inpatient appropriate because: remains on IV therapies as above, severity of illness with multiple acute issues as outlined  Patient is very high risk for poor outcomes, morbidity and mortality given severity of his acute illness and multiple complicating underlying chronic comorbidities including HFrEF, severe PAD, CKD with worsening renal function.    Planned Discharge Destination: Skilled nursing facility    Time spent: 36 minutes   Author: Pennie Banter, DO 05/28/2022 11:42 AM  For on call review [email or web address].

## 2022-05-28 NOTE — Progress Notes (Signed)
Insurance claims handler Liaison Note  Received request from Gabriel Cirri, RN TOC, for hospice services at home  vs. [facility]Patient [facility]osp[facility] after discharge from [facility]. Spoke with patient's daughter, Royal Hawthorn and his son, Brentyn Skoog, to initiate education related to hospice philosophy, services, and team approach to care. Patient/family verbalized understanding of information given. [name] and Will verbalize that they would like to proceed with full comfort measures and focus on quality of life for their dad.  Palliative Medicine team involved and information reported to Leeanne Deed NP.    This patient and family are known to this RN as hospice referral was made during last hospitalization with education provided.  Patient ended up discharging home with home health.  At this time, we will re-evaluate tomorrow and see how Mr. Turchetta does over the next 24 hours.  If patient continues with agitation/restlessness, he maybe appropriate for Inpatient Hospice for symptom management.  Continued collaboration with patient/family and [facility] staff is ongoing through final disposition.  Please call with any hospice related questions or concerns. Thank you for the opportunity to participate in this patient's care.  Norris Cross, RN Nurse Liaison [phone]

## 2022-05-28 NOTE — Progress Notes (Signed)
[facility]  ROUNDING NOTE   Subjective:   Patient laying in bed Eyes closed Oriented to self only Intermittent jerking  Room air Lower extremity edema remains  Creatinine 1.76  Objective:  Vital signs in last 24 hours:  Temp:  [97.4 F (36.3 C)-98.3 F (36.8 C)] 97.4 F (36.3 C) (05/05 0802) Pulse Rate:  [76-85] 85 (05/05 0802) Resp:  [18-20] 18 (05/05 0802) BP: (101-114)/(71-78) 106/75 (05/05 0802) SpO2:  [92 %-100 %] 99 % (05/05 0802)  Weight change:  Filed Weights   05/24/22 1236 05/25/22 0500 05/26/22 0428  Weight: 82.6 kg 82.4 kg 84 kg    Intake/Output: I/O last 3 completed shifts: In: 2519.6 [P.O.:540; I.V.:1629.6; IV Piggyback:350] Out: 2300 [Urine:2300]   Intake/Output this shift:  No intake/output data recorded.  Physical Exam: General: NAD, restless  Head: Normocephalic, atraumatic. Moist oral mucosal membranes  Eyes: Anicteric  Lungs:  Clear to auscultation, normal effort  Heart: Regular rate and rhythm  Abdomen:  Soft, nontender  Extremities:  2+ peripheral edema.  Neurologic: Oriented to self, intermittent jerking  Skin: No lesions, RLE erythema   Access: None    Basic Metabolic Panel: Recent Labs  Lab 05/24/22 1309 05/25/22 0215 05/26/22 0452 05/27/22 0502 05/28/22 0450  NA 134* 130* 129* 126* 129*  K 5.3* 4.7 4.2 3.5 3.6  CL 103 105 100 103 106  CO2 18* 15* 22 16* 18*  GLUCOSE 99 90 97 93 99  BUN 32* 36* 51* 51* 46*  CREATININE 2.69* 2.52* 2.82* 2.15* 1.76*  CALCIUM 8.4* 7.8* 7.7* 7.6* 7.6*  MG  --   --  1.9 2.0  --   PHOS  --   --   --  3.4 3.0     Liver Function Tests: Recent Labs  Lab 05/24/22 1309 05/26/22 0452 05/27/22 0502 05/28/22 0450  AST 138* 49*  --   --   ALT 53* 41  --   --   ALKPHOS 89 64  --   --   BILITOT 1.8* 1.1  --   --   PROT 6.1* 6.1*  --   --   ALBUMIN 2.9* 2.2* 1.9* 1.9*    No results for input(s): "LIPASE", "AMYLASE" in the last 168 hours. No results for input(s): "AMMONIA"  in the last 168 hours.  CBC: Recent Labs  Lab 05/24/22 1309 05/25/22 0215 05/26/22 0452 05/27/22 0613 05/28/22 0450  WBC 7.2 7.3 10.8* 7.7 5.4  NEUTROABS 6.4  --   --   --   --   HGB 13.4 13.0 12.1* 10.9* 10.9*  HCT 41.1 38.7* 36.2* 31.8* 32.1*  MCV 100.5* 98.5 97.8 95.8 95.8  PLT 155 140* 123* 80* 72*     Cardiac Enzymes: Recent Labs  Lab 05/24/22 1309  CKTOTAL 93     BNP: Invalid input(s): "POCBNP"  CBG: No results for input(s): "GLUCAP" in the last 168 hours.  Microbiology: Results for orders placed or performed during the hospital encounter of 05/24/22  Blood Culture (routine x 2)     Status: Abnormal   Collection Time: 05/24/22  1:09 PM   Specimen: BLOOD  Result Value Ref Range Status   Specimen Description   Final    BLOOD BLOOD LEFT ARM Performed at [facility], [address], [place], [place] [address]    Special Requests   Final    BOTTLES DRAWN AEROBIC AND ANAEROBIC Blood Culture results may not be optimal due to an excessive volume of blood received in culture  bottles Performed at [facility], [address] [place], [place] [address]    Culture  Setup Time   Final    GRAM POSITIVE COCCI IN BOTH AEROBIC AND ANAEROBIC BOTTLES CRITICAL VALUE NOTED.  VALUE IS CONSISTENT WITH PREVIOUSLY REPORTED AND CALLED VALUE. Performed at [facility], [address], [place], [place] [address]    Culture (A)  Final    GROUP A STREP (S.PYOGENES) ISOLATED SUSCEPTIBILITIES PERFORMED ON PREVIOUS CULTURE WITHIN THE LAST 5 DAYS. Performed at [facility], [address] [place], [place] [address]    Report Status 05/27/2022 FINAL  Final  Blood Culture (routine x 2)     Status: Abnormal   Collection Time: 05/24/22  1:09 PM   Specimen: BLOOD  Result Value Ref Range Status   Specimen Description   Final    BLOOD BLOOD LEFT ARM Performed at [facility], [address], [place], [place] [address]    Special  Requests   Final    BOTTLES DRAWN AEROBIC AND ANAEROBIC Blood Culture adequate volume Performed at [facility], [address], [place], [place] [address]    Culture  Setup Time   Final    GRAM POSITIVE COCCI IN BOTH AEROBIC AND ANAEROBIC BOTTLES CRITICAL RESULT CALLED TO, READ BACK BY AND VERIFIED WITH: NATAN BLUE@0428  05/25/22 RH    Culture (A)  Final    STREPTOCOCCUS PYOGENES HEALTH DEPARTMENT NOTIFIED Performed at [facility], [address], [place], [place] [address]    Report Status 05/27/2022 FINAL  Final   Organism ID, Bacteria STREPTOCOCCUS PYOGENES  Final      Susceptibility   Streptococcus pyogenes - MIC*    PENICILLIN <=0.06 SENSITIVE Sensitive     CEFTRIAXONE <=0.12 SENSITIVE Sensitive     ERYTHROMYCIN <=0.12 SENSITIVE Sensitive     LEVOFLOXACIN 0.5 SENSITIVE Sensitive     VANCOMYCIN 0.5 SENSITIVE Sensitive     * STREPTOCOCCUS PYOGENES  Blood Culture ID Panel (Reflexed)     Status: Abnormal   Collection Time: 05/24/22  1:09 PM  Result Value Ref Range Status   Enterococcus faecalis NOT DETECTED NOT DETECTED Final   Enterococcus Faecium NOT DETECTED NOT DETECTED Final   Listeria monocytogenes NOT DETECTED NOT DETECTED Final   Staphylococcus species NOT DETECTED NOT DETECTED Final   Staphylococcus aureus (BCID) NOT DETECTED NOT DETECTED Final   Staphylococcus epidermidis NOT DETECTED NOT DETECTED Final   Staphylococcus lugdunensis NOT DETECTED NOT DETECTED Final   Streptococcus species DETECTED (A) NOT DETECTED Final    Comment: CRITICAL RESULT CALLED TO, READ BACK BY AND VERIFIED WITH: NATHAN BLUE@0428  05/25/22 RH    Streptococcus agalactiae NOT DETECTED NOT DETECTED Final   Streptococcus pneumoniae NOT DETECTED NOT DETECTED Final   Streptococcus pyogenes DETECTED (A) NOT DETECTED Final    Comment: CRITICAL RESULT CALLED TO, READ BACK BY AND VERIFIED WITH: NATHAN BLUE@0428  05/25/22 RH    A.calcoaceticus-baumannii NOT DETECTED NOT DETECTED Final    Bacteroides fragilis NOT DETECTED NOT DETECTED Final   Enterobacterales NOT DETECTED NOT DETECTED Final   Enterobacter cloacae complex NOT DETECTED NOT DETECTED Final   Escherichia coli NOT DETECTED NOT DETECTED Final   Klebsiella aerogenes NOT DETECTED NOT DETECTED Final   Klebsiella oxytoca NOT DETECTED NOT DETECTED Final   Klebsiella pneumoniae NOT DETECTED NOT DETECTED Final   Proteus species NOT DETECTED NOT DETECTED Final   Salmonella species NOT DETECTED NOT DETECTED Final   Serratia marcescens NOT DETECTED NOT DETECTED Final  Haemophilus influenzae NOT DETECTED NOT DETECTED Final   Neisseria meningitidis NOT DETECTED NOT DETECTED Final   Pseudomonas aeruginosa NOT DETECTED NOT DETECTED Final   Stenotrophomonas maltophilia NOT DETECTED NOT DETECTED Final   Candida albicans NOT DETECTED NOT DETECTED Final   Candida auris NOT DETECTED NOT DETECTED Final   Candida glabrata NOT DETECTED NOT DETECTED Final   Candida krusei NOT DETECTED NOT DETECTED Final   Candida parapsilosis NOT DETECTED NOT DETECTED Final   Candida tropicalis NOT DETECTED NOT DETECTED Final   Cryptococcus neoformans/gattii NOT DETECTED NOT DETECTED Final    Comment: Performed at [facility], [address] [place], [place] [address]  Culture, blood (Routine X 2) w Reflex to ID Panel     Status: None (Preliminary result)   Collection Time: 05/26/22  4:52 AM   Specimen: BLOOD  Result Value Ref Range Status   Specimen Description BLOOD  LEFT ARM  Final   Special Requests   Final    BOTTLES DRAWN AEROBIC AND ANAEROBIC Blood Culture results may not be optimal due to an inadequate volume of blood received in culture bottles   Culture   Final    NO GROWTH 2 DAYS Performed at [facility], [address], [place], [place] [address]    Report Status PENDING  Incomplete  Culture, blood (Routine X 2) w Reflex to ID Panel     Status: None (Preliminary result)   Collection Time: 05/26/22  4:58 AM    Specimen: BLOOD  Result Value Ref Range Status   Specimen Description BLOOD  RIGHT ARM  Final   Special Requests   Final    BOTTLES DRAWN AEROBIC AND ANAEROBIC Blood Culture adequate volume   Culture   Final    NO GROWTH 2 DAYS Performed at [facility], [address] [place], [place] [address]    Report Status PENDING  Incomplete    Coagulation Studies: No results for input(s): "LABPROT", "INR" in the last 72 hours.   Urinalysis: No results for input(s): "COLORURINE", "LABSPEC", "PHURINE", "GLUCOSEU", "HGBUR", "BILIRUBINUR", "KETONESUR", "PROTEINUR", "UROBILINOGEN", "NITRITE", "LEUKOCYTESUR" in the last 72 hours.  Invalid input(s): "APPERANCEUR"    Imaging: No results found.   Medications:    sodium chloride 50 mL/hr at 05/28/22 0520   pencillin G potassium IV 3 Million Units (05/28/22 1131)    amiodarone  200 mg Oral BID   apixaban  5 mg Oral BID   atorvastatin  80 mg Oral Daily   Chlorhexidine Gluconate Cloth  6 each Topical Daily   clopidogrel  75 mg Oral Daily   escitalopram  20 mg Oral Daily   linezolid  600 mg Oral Q12H   multivitamin with minerals  1 tablet Oral Daily   tamsulosin  0.4 mg Oral QHS   acetaminophen, acetaminophen, albuterol, diazepam, diazepam, diphenhydrAMINE, hydrALAZINE, oxyCODONE  Assessment/ Plan:  Mr. Curtis Wagner is a 82 y.o.  male  with past medical problems include hypertension, PVD, CAD, CHF, atrial fibrillation, and chronic kidney disease stage IIIa, who was admitted to [facility] on 05/24/2022 for Cellulitis of right lower extremity [L03.115] Injury of head, initial encounter [S09.90XA] Fall, initial encounter [W19.XXXA] Fall at home, initial encounter [id number], Y92.009] Hypotension, unspecified hypotension type [I95.9]   Acute kidney injury on chronic kidney disease stage IIIa. Baseline creatinine 1.30 with GFR 54 on 04/13/22. CT abd pelvis shows a significant left-sided hydronephrosis with hydroureter down to bladder  without obstructing calculus or mass and 12 mm left renal calculus present. Acute  kidney injury likely secondary to infection, renal hydronephrosis appears chronic. Creatinine 2.69 on admission. No acute indication for dialysis. Would recommend Urology view obstruction.  Creatinine improving, Adequate UOP. Will continue to monitor renal function.   Lab Results  Component Value Date   CREATININE 1.76 (H) 05/28/2022   CREATININE 2.15 (H) 05/27/2022   CREATININE 2.82 (H) 05/26/2022    Intake/Output Summary (Last 24 hours) at 05/28/2022 1216 Last data filed at 05/28/2022 0522 Gross per 24 hour  Intake 1579.59 ml  Output 800 ml  Net 779.59 ml     2. Anemia of chronic kidney disease Lab Results  Component Value Date   HGB 10.9 (L) 05/28/2022    Hgb remains stable  3. Hypertension with chronic kidney Home regimen includes amiodarone, metoprolol, and spironolactone.  Currently receiving amiodarone only. Blood pressure 106/75   4.  Cellulitis, right lower extremity.  Blood cultures positive for group strep A bacteremia.  ID following.  Patient currently prescribed linezolid and penicillin G. Due to volume concerns, order placed to hold IVF while receiving penicillin G.   LOS: 4   5/5/202412:16 PM

## 2022-05-28 NOTE — Plan of Care (Signed)
  Problem: Health Behavior/Discharge Planning: Goal: Ability to manage health-related needs will improve Outcome: Progressing   Problem: Clinical Measurements: Goal: Ability to maintain clinical measurements within normal limits will improve Outcome: Progressing Goal: Diagnostic test results will improve Outcome: Progressing   Problem: Nutrition: Goal: Adequate nutrition will be maintained Outcome: Progressing   Problem: Pain Managment: Goal: General experience of comfort will improve Outcome: Progressing

## 2022-05-28 NOTE — Plan of Care (Signed)
?  Problem: Health Behavior/Discharge Planning: ?Goal: Ability to manage health-related needs will improve ?Outcome: Progressing ?  ?Problem: Nutrition: ?Goal: Adequate nutrition will be maintained ?Outcome: Progressing ?  ?Problem: Safety: ?Goal: Ability to remain free from injury will improve ?Outcome: Progressing ?  ?Problem: Skin Integrity: ?Goal: Risk for impaired skin integrity will decrease ?Outcome: Progressing ?  ?

## 2022-05-28 NOTE — Consult Note (Signed)
Consultation Note Date: 05/28/2022   Patient Name: Curtis Wagner  DOB: 1940/02/02  MRN: [id number]  Age / Sex: 82 y.o., male  PCP: Margaretann Loveless, MD Referring Physician: Pennie Banter, DO  Reason for Consultation: Establishing goals of care   HPI/Brief Hospital Course: 82 y.o. male  with past medical history of HTN, HLD, PVD, CAD, right atrial thrombus, persistent A. Fib and PE on Eliquis, DVT, CKD stage 3a admitted from home on 05/24/2022 with fall with complaints of associated right lower leg pain that initially started 3 days prior.  Noted abrasion to forehead. No reports of loss of consciousness. Imaging negative for acute injury.  Being treated for severe sepsis secondary to RLE cellulitis, ARF superimposed on stage 3a CKD, hyponatremia, urinary retention, and metabolic encephalopathy.  Noted recent hospital admission in March 2024. Curtis Wagner familiar to our service as we were consulted and followed during his previous admission.  Palliative medicine was consulted for assisting with goals of care conversations.  Subjective:  Extensive chart review has been completed prior to meeting patient including labs, vital signs, imaging, progress notes, orders, and available advanced directive documents from current and previous encounters.  Introduced myself as a Publishing rights manager as a member of the palliative care team. Explained palliative medicine is specialized medical care for people living with serious illness. It focuses on providing relief from the symptoms and stress of a serious illness. The goal is to improve quality of life for both the patient and the family.   Visited with Curtis Wagner at his bedside. Daughter-Curtis Wagner, son-Curtis Wagner and Curtis Wagner present during time of visit. Curtis Wagner remains pleasantly confused at this time, unable to participate in GOC conversations.  Curtis Wagner shared her concerns of her fathers noted ongoing decline. She shares  she feels if he were able to speak and make decisions for himself he would not wan to continue on as he is. Prior to first admission. Curtis Wagner remained very independent. Curtis Wagner shares that she feels strongly that Curtis Wagner Curtis Wagner not be able to return home independently and living within a facility is not what Curtis Wagner would desire. Curtis Wagner shares she feels transitioning to a comfort care approach is most appropriate at this time. She also expresses interest in meeting with Renea Ee, RN [facility] liaison to further discuss options.  After meeting with Hospice liaison, Curtis Wagner desires full comfort measures. Curtis Wagner would no longer receive aggressive medical interventions such as continuous vital signs, lab work, radiology testing, or medications not focused on comfort. All care would focus on how the patient is looking and feeling. This would include management of any symptoms that may cause discomfort, pain, shortness of breath, cough, nausea, agitation, anxiety, and/or secretions etc. Symptoms would be managed with medications and other non-pharmacological interventions such as spiritual support if requested, repositioning, music therapy, or therapeutic listening. Family verbalized understanding and appreciation.  Curtis Wagner shares her understanding and wishes to proceed with comfort measures.  Plan to assess Curtis Wagner again tomorrow, hopeful he Curtis Wagner be appropriate for IPU.  Emotional support provided to Curtis Wagner and other family present.  All questions/concerns addressed. Emotional support provided to patient/family/support persons.   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)  Chronic systolic CHF (congestive heart failure) (HCC)  History of pulmonary embolism  Acute renal failure superimposed on stage 3a chronic kidney  disease (HCC)  Hyperkalemia  Abnormal LFTs  Myocardial injury  Diarrhea  Severe sepsis (HCC)  Hyponatremia  Type 2 diabetes mellitus with stage 3b chronic kidney disease, without long-term current use of insulin (HCC)  PAD (peripheral artery disease) (HCC)  Elevated lactic acid level  Acute metabolic encephalopathy  Prolonged QT interval  Hydronephrosis of left kidney  Thrombocytopenia (HCC)   Vital Signs: BP 106/75 (BP Location: Left Arm)   Pulse 85   Temp (!) 97.4 F (36.3 C)   Resp 18   Ht 5\' 11"  (1.803 m)   Wt 84 kg   SpO2 99%   BMI 25.83 kg/m  Pain Scale: 0-10 POSS *See Group Information*: 1-Acceptable,Awake and alert Pain Score: Asleep  IO: Intake/output summary:  Intake/Output Summary (Last 24 hours) at 05/28/2022 1728 Last data filed at 05/28/2022 0522 Gross per 24 hour  Intake 1339.59 ml  Output 800 ml  Net 539.59 ml    LBM: Last BM Date : 05/25/22 Baseline Weight: Weight: 82.6 kg Most recent weight: Weight: 84 kg      Assessment and Plan  SUMMARY OF RECOMMENDATIONS   DNR Comfort measures-Morphine SL/PO 5 mg q2h PRN, Valium IV/PO PRN, Robinul as needed-all needed to maintain comfort Hospice following as Curtis Wagner provide care on discharge PMT to continue to follow for ongoing needs and support  Discussed With: Primary team and nursing staff.   Thank you for this consult and allowing Palliative Medicine to participate in the care of Curtis Wagner. Palliative medicine Curtis Wagner continue to follow and assist as needed.   Time Total: 75 minutes  Time spent includes: Detailed review of medical records (labs, imaging, vital signs), medically appropriate exam (mental status, respiratory, cardiac, skin), discussed with treatment team, counseling and educating patient, family and staff, documenting clinical information, medication management and coordination of care.   Signed by: Leeanne Deed, DNP, AGNP-C Palliative Medicine    Please contact Palliative Medicine  Team phone at [phone] for questions and concerns.  For individual provider: See Loretha Stapler

## 2022-05-29 DIAGNOSIS — Z515 Encounter for palliative care: Secondary | ICD-10-CM

## 2022-05-29 LAB — CULTURE, BLOOD (ROUTINE X 2)
Culture: NO GROWTH
Culture: NO GROWTH

## 2022-05-29 NOTE — Progress Notes (Signed)
Progress Note   Patient: Curtis Wagner DOB: 11/02/1940 DOA: 05/24/2022     5 DOS: the patient was seen and examined on 05/29/2022   Brief hospital course: HPI on admission 05/24/22 by Dr. Clyde Lundborg: "Curtis Wagner is a 82 y.o. 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   Per her daughter at the bedside, patient fell in the early morning at about 3 AM.  He is supposed to use Vavrek, but did not use it. No loss of consciousness.  Patient has abrasion in forehead.  No unilateral numbness or tinglings in extremities.  No facial droop or slurred speech.  Patient does not have chest pain, cough, shortness of breath.  No nausea, vomiting or abdominal pain.  Patient has diarrhea in the past several days, several watery diarrhea each day.  No fever or chills.  No symptoms of UTI.  Patient reports right lower leg pain which has been going on for about 3 days, also has swelling and erythema in left lower leg.   Data reviewed independently and ED Course: pt was found to have WBC 7.2, BNP 2406, lactic acid 4.5, troponin level 22, 21, potassium 5.3, worsening renal function with creatinine 2.69, BUN 32 and GFR 23 (recent baseline creatinine 1.39 on 04/17/2022), abnormal liver function (ALP 89, AST 138, ALT 53, total bilirubin 1.8).  Temperature normal, soft blood pressure 86/61 which improved to 100/83 after giving 750 cc LR bolus in ED --> 98/70, heart rate 69 --> 103, RR 16-->23, oxygen saturation 98% on room air.  Chest x-ray negative.  Images are negative for acute injury including CT of head, CT of C-spine, x-ray of her bilateral ankles, x-ray of left forearm.  Right lower extremity venous Doppler negative for DVT but showed Baker's cyst.  Patient is admitted to telemetry bed as inpatient"   Further hospital course and management as outlined below.     Assessment and Plan: Comfort measures only status Comfort care initiated afternoon 05/28/22 --Hospice liaison following --Continue comfort care per orders --Notify provider if signs of pain, distress, discomfort or uncontrolled agitation --Chronic meds have been d/c's  Acute metabolic encephalopathy Due to infection/sepsis, renal failure and other metabolic derangements, hospital environement 5/2 AM - nursing staff reported severe agitation, combativeness, pt pulled out his IV's was attempting to bite staff --Improved with 2.5 mg IV Valium --Avoiding antipsychotics w prolonged QTc --Continue PRN IV or PO Valium, minimize as much as possible --Mittens if needed to maintain IV access --Sitter if needed for safety --Delirium precautions  Severe sepsis (HCC) Due to RLE Cellulitis Strep pyogenes Bacteremia With tachycardia HR 103 and tachypnea RR 23.  No fever or leukocytosis.  Lactic acidosis, AKI, transaminitis consistent with organ dysfunction and severe sepsis.  --Continue IV PCN G and Clinda --ID is consulted, appreciate recommendations --Concern for Toxic Shock Syndrome with with AKI, transaminitis, coagulopathy, hypotension --ID started IVIG on 5/2 --Monitor volume status  --Follow cultures --Trend lactic acid, procal  Acute renal failure superimposed on stage 3a chronic kidney disease (HCC) Cr on admission 2.69 Baseline Cr 1.3-1.4 one month ago Suspect due to severe sepsis. At risk for cardiorenal syndrome given low EF. --Avoid using renal toxic medications --Hold spironolactone --Consulted Nephrology  Cellulitis of right lower extremity See Severe Sepsis  Chronic systolic CHF (congestive heart failure) (HCC) Echo on 04/12/2022 showed EF<20%.  Despite elevated BNP 2406, no dypsnea  or hypoxia, no pulm edema on chest x-ray. Overall compensated on admission. High risk of CHF decompensation. --Watch volume status closely --On gentle IV fluids per nephrology --Hold  spironolactone due to AKI and soft BP --Strict I/O's & Daily Wts --ID might be starting IVIG, will likely need some diuresis to offset  Hyponatremia Na on admission 134 >> 130 >> 129 >> 126, progressively worsening on fluids and IVIG. Hypervolemic --Monitor BMP --Daily weights, strict I/O's --On fluids for AKI per nephrology --Avoiding diuresis at this time to prevent worsening renal function  Diarrhea Follow stool studies Enteric precautions for now Monitor & replace electrolytes  Myocardial injury Due to demand ischemia in setting of severe sepsis.  No chest pain, EKG non-acute. Troponin 22 >> 21  Abnormal LFTs Possibly due to sepsis / ? Toxic shock synd given Strep bacteremia. Acute hepatitis panel negative. --Monitor LFT's  Elevated lactic acid level Due to severe sepsis. Lactate normalized 1.3 (on 5/4) Continue to trend lactate On gentle IV fluids per nephrology Watch volume & respiratory status closely (EF < 20%)  Hyperkalemia K was 5.3 on admission Resolved --Monitor BMP  PAD (peripheral artery disease) (HCC) Recent LLE re-vascularization in March admission with vascular surgery --On Eliquis, Lipitor --Follow up as scheduled with Vascular --Consult Vascular if acute issues develop  Fall at home, initial encounter PT/OT Needs SNF/rehab at d/c [facility] following for placement Fall precautions   Hypertension --Hold metoprolol due to soft BP --Hold spironolactone for soft BP, AKI --IV hydralazine as needed  PAF (paroxysmal atrial fibrillation) (HCC) Continue Eliquis, amiodarone Metoprolol on hold for soft BP's Telemetry  History of pulmonary embolism On Eliquis  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 --Bladder scans show ongoing retention --Place Foley --Urology follow up  Prolonged QT interval Serial EKG's no monitor. Avoid QT prolonging medications including Haldol, Zofran Using Valium for agitation for now Maintain K>4, Mg>2  Thrombocytopenia (HCC) Likely due to sepsis/infection. Platelets 140 >> 123 >> 80k --Monitor CBC daily --Remains on Eliquis and Plavix for now, monitor closely  Type 2 diabetes mellitus with stage 3b chronic kidney disease, without long-term current use of insulin (HCC) Hold Jardiance Start sliding scale Novolog if needed Monitor fasting glucose for now        Subjective: Pt was sleeping when seen ithis AM, woke to voice.  He denied acute complaints.  Stated he did not want his breakfast, did not like the food.  Otherwise asks to "get out of here".  No other acute complaints or acute events reported.  Now on comfort care after palliative discussions with family yesterday.  Will d/c with hospice either at home vs inpatient.   Physical Exam: Vitals:   05/28/22 0000 05/28/22 0802 05/29/22 0744 05/29/22 0750  BP:  106/75 (!) 108/90 102/82  Pulse: 83 85 83 (!) 116  Resp:  18 16   Temp:  (!) 97.4 F (36.3 C) 98.2 F (36.8 C) (!) 97.3 F (36.3 C)  TempSrc:   Oral   SpO2: 100% 99% 99% 97%  Weight:      Height:       General exam: sleeping comfortably woke to voide, no acute distress HEENT: forehead and nasal bridge abrasion and surrounding ecchymosis, moist mucus membranes, hearing grossly normal  Respiratory system: CTAB dimished bases,  no wheezes, rales or rhonchi, normal respiratory effort. Cardiovascular system: RRR, Increased Right > left lower extremity edema (3+ on right, 1+ on left).   Gastrointestinal system: soft, NT, ND Central nervous system: grossly non-focal, normal speech,. Oriented to self and hospital Extremities: RLE anterior lower leg abrasion with worsening RLE edema, increasing LLE edema, mittens on hands Skin: dry,  intact, warmth and erythema of distal RLE    Data Reviewed:  No new labs --- on comfort care  Micro --  Blood cultures + Strep pyogenes (group A strep) - senistive to PCN, CTX, Levaquin, Vanc, erythromycin C diff - cancelled, diarrhea resolved GI panel - cancelled, diarrhea resolved  Family Communication: Son updated at bedside afternoon 5/4.  None present on rounds this AM.  Disposition: Status is: Inpatient Remains inpatient appropriate because: remains on IV therapies as above, severity of illness with multiple acute issues as outlined.  Now comfort measures, awaiting d/c plan either home w hospice vs inpatient hospise  Patient is very high risk for poor outcomes, morbidity and mortality given severity of his acute illness and multiple complicating underlying chronic comorbidities including HFrEF, severe PAD, CKD with worsening renal function.    Planned Discharge Destination: Skilled nursing facility    Time spent: 35 minutes   Author: Pennie Banter, DO 05/29/2022 2:33 PM  For on call review [email or web address].

## 2022-05-29 NOTE — Assessment & Plan Note (Addendum)
Comfort care initiated in the afternoon of 05/28/22.  Currently not a candidate for hospice home.  Calorie count.

## 2022-05-30 DIAGNOSIS — G9341 Metabolic encephalopathy: Secondary | ICD-10-CM | POA: Diagnosis not present

## 2022-05-30 DIAGNOSIS — Z515 Encounter for palliative care: Secondary | ICD-10-CM | POA: Diagnosis not present

## 2022-05-30 DIAGNOSIS — S0990XA Unspecified injury of head, initial encounter: Secondary | ICD-10-CM

## 2022-05-30 DIAGNOSIS — I959 Hypotension, unspecified: Secondary | ICD-10-CM | POA: Diagnosis not present

## 2022-05-30 DIAGNOSIS — L03115 Cellulitis of right lower limb: Secondary | ICD-10-CM | POA: Diagnosis not present

## 2022-05-30 LAB — CULTURE, BLOOD (ROUTINE X 2)

## 2022-05-30 NOTE — Progress Notes (Signed)
[facility]- Civil engineer, contracting [facility])   HL informed the daughter that the patient does not meet IPU criteria at this time.  HL encouraged her to contact the TOC to discuss discharge options.  HL informed the daughter that Hospice can follow in a LTC facility as long as the facility is not billing Medicare.   Please don't hesitate to call with any Hospice related questions or concerns.    Thank you for the opportunity to participate in this patient's care. [facility] Liaison [phone]  [facility] Liaison  [phone]

## 2022-05-30 NOTE — Care Management Important Message (Signed)
Important Message  Patient Details  Name: Curtis Wagner MRN: [id number] Date of Birth: 08-24-1940   Medicare Important Message Given:  Other (see comment)  On comfort care measures.  Medicare IM withheld at this time out of respect for patient and family.    Johnell Comings 05/30/2022, 8:44 AM

## 2022-05-30 NOTE — Plan of Care (Signed)
  Problem: Education: Goal: Knowledge of General Education information will improve Description: Including pain rating scale, medication(s)/side effects and non-pharmacologic comfort measures Outcome: Progressing   Problem: Health Behavior/Discharge Planning: Goal: Ability to manage health-related needs will improve Outcome: Progressing   Problem: Clinical Measurements: Goal: Ability to maintain clinical measurements within normal limits will improve Outcome: Progressing   Problem: Nutrition: Goal: Adequate nutrition will be maintained Outcome: Progressing   Problem: Elimination: Goal: Will not experience complications related to bowel motility Outcome: Progressing   Problem: Pain Managment: Goal: General experience of comfort will improve Outcome: Progressing   

## 2022-05-30 NOTE — Progress Notes (Signed)
Progress Note   Patient: Curtis Wagner ZOX:[id number] DOB: 1940/02/02 DOA: 05/24/2022     6 DOS: the patient was seen and examined on 05/30/2022   Brief hospital course: HPI on admission 05/24/22 by Dr. Clyde Lundborg: "Jeramine Louks is a 82 y.o. 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   Per her daughter at the bedside, patient fell in the early morning at about 3 AM.  He is supposed to use Carrara, but did not use it. No loss of consciousness.  Patient has abrasion in forehead.  No unilateral numbness or tinglings in extremities.  No facial droop or slurred speech.  Patient does not have chest pain, cough, shortness of breath.  No nausea, vomiting or abdominal pain.  Patient has diarrhea in the past several days, several watery diarrhea each day.  No fever or chills.  No symptoms of UTI.  Patient reports right lower leg pain which has been going on for about 3 days, also has swelling and erythema in left lower leg.   Data reviewed independently and ED Course: pt was found to have WBC 7.2, BNP 2406, lactic acid 4.5, troponin level 22, 21, potassium 5.3, worsening renal function with creatinine 2.69, BUN 32 and GFR 23 (recent baseline creatinine 1.39 on 04/17/2022), abnormal liver function (ALP 89, AST 138, ALT 53, total bilirubin 1.8).  Temperature normal, soft blood pressure 86/61 which improved to 100/83 after giving 750 cc LR bolus in ED --> 98/70, heart rate 69 --> 103, RR 16-->23, oxygen saturation 98% on room air.  Chest x-ray negative.  Images are negative for acute injury including CT of head, CT of C-spine, x-ray of her bilateral ankles, x-ray of left forearm.  Right lower extremity venous Doppler negative for DVT but showed Baker's cyst.  Patient is admitted to telemetry bed as inpatient"   Further hospital course and management as outlined below.     Assessment and Plan: Comfort measures only status Comfort care initiated afternoon 05/28/22 --Hospice liaison following --Continue comfort care per orders --Notify provider if signs of pain, distress, discomfort or uncontrolled agitation --Chronic meds have been d/c's  Acute metabolic encephalopathy Due to infection/sepsis, renal failure and other metabolic derangements, hospital environement 5/2 AM - nursing staff reported severe agitation, combativeness, pt pulled out his IV's was attempting to bite staff --Improved with 2.5 mg IV Valium --Avoiding antipsychotics w prolonged QTc --Continue PRN IV or PO Valium, minimize as much as possible --Mittens if needed to maintain IV access --Sitter if needed for safety --Delirium precautions  Severe sepsis (HCC) Due to RLE Cellulitis Strep pyogenes Bacteremia With tachycardia HR 103 and tachypnea RR 23.  No fever or leukocytosis.  Lactic acidosis, AKI, transaminitis consistent with organ dysfunction and severe sepsis.  --Off antibiotics, per comfort care --ID is consulted, appreciate recommendations --Concern for Toxic Shock Syndrome with with AKI, transaminitis, coagulopathy, hypotension --ID started IVIG on 5/2 --Monitor volume status  --Follow cultures --Trend lactic acid, procal  Acute renal failure superimposed on stage 3a chronic kidney disease (HCC) Cr on admission 2.69 Baseline Cr 1.3-1.4 one month ago Suspect due to severe sepsis. At risk for cardiorenal syndrome given low EF. --Avoid using renal toxic medications --Hold spironolactone --Consulted Nephrology  Cellulitis of right lower extremity See Severe Sepsis  Chronic systolic CHF (congestive heart failure) (HCC) Echo on 04/12/2022 showed EF<20%.  Despite elevated BNP 2406, no dypsnea or  hypoxia, no pulm edema on chest x-ray. Overall compensated on admission. High risk of CHF decompensation. --Watch volume status closely --Off IV fluids given per  nephrology  Hyponatremia Na on admission 134 >> 130 >> 129 >> 126, progressively worsening on fluids and IVIG. Hypervolemic --Monitor BMP --Daily weights, strict I/O's --On fluids for AKI per nephrology --Avoiding diuresis at this time to prevent worsening renal function  Diarrhea Resolved.  Myocardial injury Due to demand ischemia in setting of severe sepsis.  No chest pain, EKG non-acute. Troponin 22 >> 21  Abnormal LFTs Possibly due to sepsis / ? Toxic shock synd given Strep bacteremia. Acute hepatitis panel negative. --Monitor LFT's  Elevated lactic acid level Due to severe sepsis. Lactate normalized 1.3 (on 5/4) Continue to trend lactate On gentle IV fluids per nephrology Watch volume & respiratory status closely (EF < 20%)  Hyperkalemia K was 5.3 on admission Resolved --Monitor BMP  PAD (peripheral artery disease) (HCC) Recent LLE re-vascularization in March admission with vascular surgery --Stopped Eliquis, Lipitor --Follow up as scheduled with Vascular --Consult Vascular if acute issues develop  Fall at home, initial encounter PT/OT Needs SNF/rehab at d/c [facility] following for placement Fall precautions   Hypertension Off medications  PAF (paroxysmal atrial fibrillation) (HCC) Stopped Eliquis, amiodarone Metoprolol on hold for soft BP's  History of pulmonary embolism Stopped Eliquis  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 --Bladder scans show ongoing retention --Place Foley --Urology follow up  Prolonged QT interval Serial EKG's no monitor. Avoid QT prolonging medications including Haldol, Zofran Using Valium for agitation for now Maintain K>4, Mg>2  Thrombocytopenia (HCC) Likely due to  sepsis/infection. Platelets 140 >> 123 >> 80k --Monitor CBC daily --Remains on Eliquis and Plavix for now, monitor closely  Type 2 diabetes mellitus with stage 3b chronic kidney disease, without long-term current use of insulin (HCC) Hold Jardiance Start sliding scale Novolog if needed Monitor fasting glucose for now        Subjective: Pt was sleeping when seen ithis AM, appears comfortable. No acute complaints or acute events reported.      Physical Exam: Vitals:   05/28/22 0802 05/29/22 0744 05/29/22 0750 05/29/22 2348  BP: 106/75 (!) 108/90 102/82 96/83  Pulse: 85 83 (!) 116 95  Resp: 18 16  20   Temp: (!) 97.4 F (36.3 C) 98.2 F (36.8 C) (!) 97.3 F (36.3 C) 98 F (36.7 C)  TempSrc:  Oral    SpO2: 99% 99% 97% 99%  Weight:      Height:       General exam: sleeping comfortably woke to voide, no acute distress HEENT: forehead and nasal bridge abrasion and surrounding ecchymosis, moist mucus membranes, hearing grossly normal  Respiratory system: CTAB dimished bases, no wheezes, rales or rhonchi, normal respiratory effort. Cardiovascular system: RRR, Increased Right > left lower extremity edema (3+ on right, 1+ on left).   Gastrointestinal system: soft, NT, ND Central nervous system: grossly non-focal, normal speech,. Oriented to self and hospital Extremities: RLE anterior lower leg abrasion with RLE edema, increasing LLE edema, mittens on hands Skin: dry, intact, warmth and erythema of distal RLE    Data Reviewed:  No new labs --- on comfort care  Micro --  Blood cultures + Strep pyogenes (group A strep) - senistive to  PCN, CTX, Levaquin, Vanc, erythromycin C diff - cancelled, diarrhea resolved GI panel - cancelled, diarrhea resolved  Family Communication: Son updated at bedside afternoon 5/4.  None present on rounds this AM.  Disposition: Status is: Inpatient Remains inpatient appropriate because: remains on IV therapies as above, severity of illness with  multiple acute issues as outlined.  Now comfort measures, awaiting d/c plan either home w hospice vs inpatient hospise  Patient is very high risk for poor outcomes, morbidity and mortality given severity of his acute illness and multiple complicating underlying chronic comorbidities including HFrEF, severe PAD, CKD with worsening renal function.    Planned Discharge Destination: Skilled nursing facility    Time spent: 25 minutes   Author: Pennie Banter, DO 05/30/2022 2:49 PM  For on call review [email or web address].

## 2022-05-30 NOTE — Progress Notes (Addendum)
                                                     Palliative Care Progress Note, Assessment & Plan   Patient Name: Curtis Wagner       Date: 05/30/2022 DOB: October 11, 1940  Age: 82 y.o. MRN#: [id number] Attending Physician: Pennie Banter, DO Primary Care Physician: Margaretann Loveless, MD Admit Date: 05/24/2022  Subjective: Patient is sitting up in bed in no apparent distress.  He is sleeping but easily arousable.  He acknowledges my presence and is able to make his wishes known.  He has no acute complaints at this time.  He is alert and oriented to self only.  No family or friends present at bedside.  HPI: 82 y.o. male  with past medical history of HTN, HLD, PVD, CAD, right atrial thrombus, persistent A. Fib and PE on Eliquis, DVT, CKD stage 3a admitted from home on 05/24/2022 with fall with complaints of associated right lower leg pain that initially started 3 days prior.   Noted abrasion to forehead. No reports of loss of consciousness. Imaging negative for acute injury.   Being treated for severe sepsis secondary to RLE cellulitis, ARF superimposed on stage 3a CKD, hyponatremia, urinary retention, and metabolic encephalopathy.   Noted recent hospital admission in March 2024. Mr. Sonn familiar to our service as we were consulted and followed during his previous admission.   Palliative medicine was consulted for assisting with goals of care conversations.   5/5, family decided to proceed with comfort care and evaluated for appropriate hospice services.   Summary of counseling/coordination of care: After reviewing the patient's chart and assessing the patient at bedside, I counseled with [facility] RN Curtis Wagner in regards to plan and goals of care.  Curtis Wagner shares patient is not IPU appropriate.  She plans to speak with patient's family in regards to hospice  services at discharge.  Symptom burden is low.  No adjustment to medications needed at this time.  Comfort measures continue.  TOC follow closely for discharge planning.  PMT will continue to follow and support patient and family throughout his hospitalization.  Physical Exam Vitals reviewed.  Constitutional:      General: He is not in acute distress.    Appearance: He is normal weight. He is not ill-appearing.  HENT:     Head: Normocephalic.     Mouth/Throat:     Mouth: Mucous membranes are moist.  Eyes:     Pupils: Pupils are equal, round, and reactive to light.  Pulmonary:     Effort: Pulmonary effort is normal.  Abdominal:     Palpations: Abdomen is soft.  Skin:    General: Skin is warm and dry.  Neurological:     Mental Status: He is alert.     Comments: Oriented to self  Psychiatric:        Mood and Affect: Mood normal.        Behavior: Behavior normal.             Total Time 25 minutes   Trevor Wilkie L. Manon Hilding, FNP-BC Palliative Medicine Team Team Phone # [phone]

## 2022-05-30 NOTE — TOC Progression Note (Signed)
Transition of Care [facility]) - Progression Note    Patient Details  Name: Curtis Wagner MRN: [id number] Date of Birth: May 08, 1940  [facility] of [facility]) CM/SW Contact  Marlowe Sax, RN Phone Number: 05/30/2022, 1:37 PM  Clinical Narrative:    Reached out to the Hpsice Liaison Misty to inquire if they will review for possible Hospice facility, waiting on response        Expected Discharge Plan and Services                                               Social Determinants of Health (SDOH) Interventions SDOH Screenings   Food Insecurity: No Food Insecurity (05/24/2022)  Housing: Low Risk  (05/24/2022)  Transportation Needs: No Transportation Needs (05/24/2022)  Utilities: Not At Risk (05/24/2022)  Depression (PHQ2-9): Medium Risk (05/04/2022)  Tobacco Use: Medium Risk (05/24/2022)    Readmission Risk Interventions     No data to display

## 2022-05-31 DIAGNOSIS — N1330 Unspecified hydronephrosis: Secondary | ICD-10-CM

## 2022-05-31 DIAGNOSIS — N183 Chronic kidney disease, stage 3 unspecified: Secondary | ICD-10-CM

## 2022-05-31 DIAGNOSIS — I480 Paroxysmal atrial fibrillation: Secondary | ICD-10-CM

## 2022-05-31 DIAGNOSIS — A400 Sepsis due to streptococcus, group A: Secondary | ICD-10-CM | POA: Diagnosis not present

## 2022-05-31 DIAGNOSIS — E871 Hypo-osmolality and hyponatremia: Secondary | ICD-10-CM

## 2022-05-31 DIAGNOSIS — I5022 Chronic systolic (congestive) heart failure: Secondary | ICD-10-CM | POA: Diagnosis not present

## 2022-05-31 DIAGNOSIS — N170 Acute kidney failure with tubular necrosis: Secondary | ICD-10-CM

## 2022-05-31 DIAGNOSIS — A419 Sepsis, unspecified organism: Secondary | ICD-10-CM | POA: Diagnosis not present

## 2022-05-31 DIAGNOSIS — I739 Peripheral vascular disease, unspecified: Secondary | ICD-10-CM

## 2022-05-31 DIAGNOSIS — Z515 Encounter for palliative care: Secondary | ICD-10-CM | POA: Diagnosis not present

## 2022-05-31 DIAGNOSIS — E1122 Type 2 diabetes mellitus with diabetic chronic kidney disease: Secondary | ICD-10-CM

## 2022-05-31 DIAGNOSIS — L03115 Cellulitis of right lower limb: Secondary | ICD-10-CM | POA: Diagnosis not present

## 2022-05-31 LAB — CULTURE, BLOOD (ROUTINE X 2)

## 2022-05-31 NOTE — Plan of Care (Signed)

## 2022-05-31 NOTE — TOC Progression Note (Signed)
Transition of Care [facility]) - Progression Note    Patient Details  Name: Curtis Wagner MRN: [id number] Date of Birth: 1940/10/19  Transition of Care [facility]) CM/SW Contact  Marlowe Sax, RN Phone Number: 05/31/2022, 1:38 PM  Clinical Narrative:   Spoke with the patients daughter Derrek Monaco, We reviewed the options that patient may have after DC, She had mentioned him going to rehab and off comfort care, I explained the process for that meaning Physical therapy works with the patient, we do a bed search , they choose a bed, Insurance will then get all of the clinical notes and has to approve him going to Rehab, once at rehab the therapy department will evaluate and will work with him he has to be able to work with PT and show measurable progress in order for Ins to continue to cover.  He will need placement after STR if they choose that route,  She is agreeable to long term bed search and requested that I have [facility] Abigail call her to discuss options She did also request that we do labs so that they can see where he is medically and determine if they should keep him comfort care or not, I reached out to Abigail at [facility] and provided her with the information and asked her to call, I also reached out to the Doctor and notified him that the daughter would like labs done         Expected Discharge Plan and Services                                               Social Determinants of Health (SDOH) Interventions SDOH Screenings   Food Insecurity: No Food Insecurity (05/24/2022)  Housing: Low Risk  (05/24/2022)  Transportation Needs: No Transportation Needs (05/24/2022)  Utilities: Not At Risk (05/24/2022)  Depression (PHQ2-9): Medium Risk (05/04/2022)  Tobacco Use: Medium Risk (05/24/2022)    Readmission Risk Interventions     No data to display

## 2022-05-31 NOTE — TOC Progression Note (Signed)
Transition of Care [facility]) - Progression Note    Patient Details  Name: Curtis Wagner MRN: [id number] Date of Birth: 01/12/1941  Transition of Care [facility]) CM/SW Contact  Marlowe Sax, RN Phone Number: 05/31/2022, 11:43 AM  Clinical Narrative:     Daughter Curtis Wagner and stated that since the patient does not meet criteria for the hospice inpatient facility she would like to call a team/family meeting anytime on Friday, I explained to her that I would be happy to arrange however I will not be in the meeting as I am on vacation that day, however my team mate covering would be involved,  She wants to know that since he is not going to hospice what is the plan She stated that he lives in independent living and that he can not go back to that due to not being safe,  She stated that now that he is not going to Hospice she wonders if she made the right choice for comfort care, she questioned about him not getting some of his routine medications, I reviewed that when a patient is comfort care they are given medications for comfort typically and not preventative medication, She stated understanding I explained that I would have Dr Renae Gloss review the medical aspect and then afterwards she and I would talk again about what direction to go I explained that [facility] does not cover long term care He does not have medicaid and does not currently qualify I reviewed some long term options, Family care home,l verses long term facility, as well as home with PCS services and reiterated that none of these are covered by Insurance ans is out of pocket I explained that we have several companies that we often use to help find placement for the patient for long tem as well as provide PCS services privately paid for by patient and family She stated that she will wait to talk with the doctor and then will talk to her family to determine what to do next step and she and I would talk again        Expected  Discharge Plan and Services                                               Social Determinants of Health (SDOH) Interventions SDOH Screenings   Food Insecurity: No Food Insecurity (05/24/2022)  Housing: Low Risk  (05/24/2022)  Transportation Needs: No Transportation Needs (05/24/2022)  Utilities: Not At Risk (05/24/2022)  Depression (PHQ2-9): Medium Risk (05/04/2022)  Tobacco Use: Medium Risk (05/24/2022)    Readmission Risk Interventions     No data to display

## 2022-05-31 NOTE — Progress Notes (Signed)
Progress Note   Patient: Curtis Wagner ZOX:[id number] DOB: 22-Apr-1940 DOA: 05/24/2022     7 DOS: the patient was seen and examined on 05/31/2022   Brief hospital course: HPI on admission 05/24/22 by Dr. Clyde Lundborg: "Curtis Wagner is a 82 y.o. 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   Per her daughter at the bedside, patient fell in the early morning at about 3 AM.  He is supposed to use Haq, but did not use it. No loss of consciousness.  Patient has abrasion in forehead.  No unilateral numbness or tinglings in extremities.  No facial droop or slurred speech.  Patient does not have chest pain, cough, shortness of breath.  No nausea, vomiting or abdominal pain.  Patient has diarrhea in the past several days, several watery diarrhea each day.  No fever or chills.  No symptoms of UTI.  Patient reports right lower leg pain which has been going on for about 3 days, also has swelling and erythema in left lower leg.   Data reviewed independently and ED Course: pt was found to have WBC 7.2, BNP 2406, lactic acid 4.5, troponin level 22, 21, potassium 5.3, worsening renal function with creatinine 2.69, BUN 32 and GFR 23 (recent baseline creatinine 1.39 on 04/17/2022), abnormal liver function (ALP 89, AST 138, ALT 53, total bilirubin 1.8).  Temperature normal, soft blood pressure 86/61 which improved to 100/83 after giving 750 cc LR bolus in ED --> 98/70, heart rate 69 --> 103, RR 16-->23, oxygen saturation 98% on room air.  Chest x-ray negative.  Images are negative for acute injury including CT of head, CT of C-spine, x-ray of her bilateral ankles, x-ray of left forearm.  Right lower extremity venous Doppler negative for DVT but showed Baker's cyst.  Patient is admitted to telemetry bed as inpatient"  Patient made comfort care on 05/28/2022.   Assessment and  Plan: * End of life care Comfort care initiated afternoon 05/28/22.  Currently not a candidate for hospice home.  TOC to look into other options.  Severe sepsis (HCC) Due to RLE Cellulitis Strep pyogenes Bacteremia With tachycardia HR 103 and tachypnea RR 23.  No fever or leukocytosis.  Lactic acidosis, AKI, transaminitis consistent with organ dysfunction and severe sepsis.  --Concern for Toxic Shock Syndrome with with AKI, transaminitis, coagulopathy, hypotension --ID started IVIG on 5/2 -- Patient was made comfort care on 05/28/2022 and antibiotics were stopped.   Acute renal failure superimposed on stage 3a chronic kidney disease (HCC) Cr on admission 2.69.  Last creatinine 1.76   Cellulitis of right lower extremity See Severe Sepsis  Chronic systolic CHF (congestive heart failure) (HCC) Echo on 04/12/2022 showed EF<20%.  On comfort care measures.  Abnormal LFTs Possibly due to sepsis / ? Toxic shock synd given Strep bacteremia. Acute hepatitis panel negative. 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    Elevated lactic acid level Due to severe sepsis. Lactate normalized 1.3 (on 5/4)   Acute metabolic encephalopathy Due to severe sepsis  Hyponatremia Last sodium 129 on 05/28/2022  History of pulmonary embolism Stopped Eliquis  PAF (paroxysmal atrial fibrillation) (HCC) Stopped Eliquis, amiodarone with comfort care measures  PAD (peripheral artery disease) (HCC) Stop medication with comfort care measures  Hyperkalemia K was 5.3 on  admission   Diarrhea Resolved.  Hyperlipidemia Off Lipitor  Hypertension Off medications  Hydronephrosis of left kidney Acute Urinary Retention CT abd/pelvis on admission 5/1 showed significant LEFT hydronephrosis and hydroureter down to bladder, no obstructing stone or mass seen. -Continue Foley  catheter  Prolonged QT interval No further monitoring  Thrombocytopenia (HCC) Last platelet count 72.  Likely with severe sepsis.  CKD stage 3 due to type 2 diabetes mellitus (HCC) Chronic disease stage IIIa.  Off medications with comfort care measures        Subjective: Patient interested in getting out of the hospital soon as possible.  Spoke with the patient's daughter and she is unable to take care of him.  Physical Exam: Vitals:   05/29/22 2348 05/30/22 1700 05/31/22 0004 05/31/22 0848  BP: 96/83 108/75 113/81 108/74  Pulse: 95 90 97 83  Resp: 20 18 20 16   Temp: 98 F (36.7 C) 98 F (36.7 C) (!) 97.5 F (36.4 C) (!) 97.5 F (36.4 C)  TempSrc:      SpO2: 99% 98% 98% 98%  Weight:      Height:       Physical Exam HENT:     Head: Normocephalic.     Mouth/Throat:     Pharynx: No oropharyngeal exudate.  Eyes:     General: Lids are normal.     Conjunctiva/sclera: Conjunctivae normal.  Cardiovascular:     Rate and Rhythm: Normal rate and regular rhythm.     Heart sounds: Normal heart sounds, S1 normal and S2 normal.  Pulmonary:     Breath sounds: No decreased breath sounds, wheezing, rhonchi or rales.  Abdominal:     Palpations: Abdomen is soft.     Tenderness: There is no abdominal tenderness.  Musculoskeletal:     Right lower leg: Swelling present.     Left lower leg: Swelling present.  Skin:    General: Skin is warm.     Findings: No rash.  Neurological:     Mental Status: He is alert.     Data Reviewed: No recent data  Family Communication: Spoke with patient's daughter on the phone.  Disposition: Status is: Inpatient Remains inpatient appropriate because: Patient currently not a candidate for hospice facility.  Patient unable to go back to independent living.  TOC to look into other options.  Planned Discharge Destination: Potentially facility with hospice, potential family care home with hospice.  If patient declines in the next few days may be a  candidate for hospice home in the future.    Time spent: 28 minutes  Author: Alford Highland, MD 05/31/2022 12:52 PM  For on call review [email or web address].

## 2022-05-31 NOTE — Progress Notes (Signed)
                                                     Palliative Care Progress Note, Assessment & Plan   Patient Name: Curtis Wagner       Date: 05/31/2022 DOB: 08/23/1940  Age: 82 y.o. MRN#: [id number] Attending Physician: Curtis Highland, MD Primary Care Physician: Curtis Loveless, MD Admit Date: 05/24/2022  Subjective: Patient is lying in bed, sleeping.  He is easily arousable and quickly returns to rest.  While awake, he had no acute complaints of pain or discomfort.  No family or friends are present at bedside.  HPI: 82 y.o. male  with past medical history of HTN, HLD, PVD, CAD, right atrial thrombus, persistent A. Fib and PE on Eliquis, DVT, CKD stage 3a admitted from home on 05/24/2022 with fall with complaints of associated right lower leg pain that initially started 3 days prior.   Noted abrasion to forehead. No reports of loss of consciousness. Imaging negative for acute injury.   Being treated for severe sepsis secondary to RLE cellulitis, ARF superimposed on stage 3a CKD, hyponatremia, urinary retention, and metabolic encephalopathy.   Noted recent hospital admission in March 2024. Curtis Wagner familiar to our service as we were consulted and followed during his previous admission.   Palliative medicine was consulted for assisting with goals of care conversations.    5/5, family decided to proceed with comfort care and evaluated for appropriate hospice services.   Summary of counseling/coordination of care: After reviewing the patient's chart, I assessed the patient and evaluated his symptom burden.  Patient plan of care is to focus strictly on comfort measures.  Patient denies acute complaints of pain, shortness of breath, headache, nausea/vomiting, constipation, and anxiety.  No adjustments to medications needed at this time.  Patient repositioned  in bed for comfort.  Patient was appreciative and quickly fell back to sleep.  Comfort measures remain.  TOC following closely for discharge planning.  Physical Exam Vitals reviewed.  Constitutional:      General: He is not in acute distress.    Appearance: He is normal weight.  HENT:     Head: Normocephalic.     Mouth/Throat:     Mouth: Mucous membranes are moist.  Eyes:     Pupils: Pupils are equal, round, and reactive to light.  Cardiovascular:     Rate and Rhythm: Normal rate.  Pulmonary:     Effort: Pulmonary effort is normal.  Abdominal:     Palpations: Abdomen is soft.  Musculoskeletal:     Comments: Generalized weakness  Skin:    General: Skin is warm and dry.  Neurological:     Mental Status: He is alert.  Psychiatric:        Mood and Affect: Mood normal.        Behavior: Behavior normal.             Total Time 25 minutes   Curtis Wagner L. Manon Hilding, FNP-BC Palliative Medicine Team Team Phone # [phone]

## 2022-06-01 DIAGNOSIS — I5022 Chronic systolic (congestive) heart failure: Secondary | ICD-10-CM | POA: Diagnosis not present

## 2022-06-01 DIAGNOSIS — W19XXXA Unspecified fall, initial encounter: Secondary | ICD-10-CM | POA: Diagnosis not present

## 2022-06-01 DIAGNOSIS — Z515 Encounter for palliative care: Secondary | ICD-10-CM | POA: Diagnosis not present

## 2022-06-01 DIAGNOSIS — M25512 Pain in left shoulder: Secondary | ICD-10-CM

## 2022-06-01 DIAGNOSIS — L03115 Cellulitis of right lower limb: Secondary | ICD-10-CM | POA: Diagnosis not present

## 2022-06-01 DIAGNOSIS — A419 Sepsis, unspecified organism: Secondary | ICD-10-CM | POA: Diagnosis not present

## 2022-06-01 DIAGNOSIS — S0990XA Unspecified injury of head, initial encounter: Secondary | ICD-10-CM | POA: Diagnosis not present

## 2022-06-01 MED ORDER — METHYLPREDNISOLONE SODIUM SUCC 40 MG IJ SOLR
20.0000 mg | Freq: Once | INTRAMUSCULAR | Status: AC
  Administered 2022-06-01: 20 mg via INTRAVENOUS
  Filled 2022-06-01: qty 1

## 2022-06-01 NOTE — Progress Notes (Signed)
[facility]- [employer] [facility] [employer] [facility] HL will remain available to answer questions and be of support to patient and family. Will monitor for discharge disposition.  Please don't hesitate to call with any Hospice related questions or concerns.    Thank you for the opportunity to participate in this patient's care. [facility] Liaison [phone]

## 2022-06-01 NOTE — Assessment & Plan Note (Addendum)
Given on dose of solumedrol on 5/9.  Tylenol as needed.

## 2022-06-01 NOTE — Progress Notes (Signed)
                                                     Palliative Care Progress Note, Assessment & Plan   Patient Name: Curtis Wagner       Date: 06/01/2022 DOB: 1940-09-10  Age: 82 y.o. MRN#: [id number] Attending Physician: Curtis Highland, MD Primary Care Physician: Curtis Loveless, MD Admit Date: 05/24/2022  Subjective: Patient is lying in bed, sleeping.  He is easily arousable but quickly falls back to sleep.  He has no nonverbal signs of discomfort or pain at this time.  Patient awakens to voice and shakes his head no when asked if he is in any pain or discomfort.  No family or friends present at bedside.  Food tray is at bedside and is untouched.  HPI: 82 y.o. male  with past medical history of HTN, HLD, PVD, CAD, right atrial thrombus, persistent A. Fib and PE on Eliquis, DVT, CKD stage 3a admitted from home on 05/24/2022 with fall with complaints of associated right lower leg pain that initially started 3 days prior.   Noted abrasion to forehead. No reports of loss of consciousness. Imaging negative for acute injury.   Being treated for severe sepsis secondary to RLE cellulitis, ARF superimposed on stage 3a CKD, hyponatremia, urinary retention, and metabolic encephalopathy.   Noted recent hospital admission in March 2024. [name] familiar to our service as we were consulted and followed during his previous admission.   Palliative medicine was consulted for assisting with goals of care conversations.    5/5, family decided to proceed with comfort care and evaluated for appropriate hospice services.   Summary of counseling/coordination of care: I have reviewed the patient's chart.  I have assessed the patient at bedside.  Symptom burden remains low.  No brow furrowing, moaning/groaning, wincing, fidgeting, or other signs of terminal agitation or pain  noted at this time. No adjustments to medications needed at this time.    Comfort measures continue.  TOC following closely for discharge planning.  PMT will continue to follow and support patient and family throughout his hospitalization.  Physical Exam Constitutional:      General: He is not in acute distress.    Appearance: Normal appearance. He is not ill-appearing.  HENT:     Head: Normocephalic.  Eyes:     Pupils: Pupils are equal, round, and reactive to light.  Cardiovascular:     Rate and Rhythm: Normal rate.  Abdominal:     Palpations: Abdomen is soft.  Musculoskeletal:     Comments: Generalized weakness  Skin:    General: Skin is warm and dry.     Coloration: Skin is pale.             Total Time 25 minutes   Curtis Brim L. Manon Hilding, FNP-BC Palliative Medicine Team Team Phone # [phone]

## 2022-06-01 NOTE — Progress Notes (Signed)
Progress Note   Patient: Curtis Wagner ZOX:[id number] DOB: 1940-07-12 DOA: 05/24/2022     8 DOS: the patient was seen and examined on 06/01/2022   Brief hospital course: HPI on admission 05/24/22 by Dr. Clyde Lundborg: "Curtis Wagner is a 82 y.o. 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   Per her daughter at the bedside, patient fell in the early morning at about 3 AM.  He is supposed to use Prosise, but did not use it. No loss of consciousness.  Patient has abrasion in forehead.  No unilateral numbness or tinglings in extremities.  No facial droop or slurred speech.  Patient does not have chest pain, cough, shortness of breath.  No nausea, vomiting or abdominal pain.  Patient has diarrhea in the past several days, several watery diarrhea each day.  No fever or chills.  No symptoms of UTI.  Patient reports right lower leg pain which has been going on for about 3 days, also has swelling and erythema in left lower leg.   Data reviewed independently and ED Course: pt was found to have WBC 7.2, BNP 2406, lactic acid 4.5, troponin level 22, 21, potassium 5.3, worsening renal function with creatinine 2.69, BUN 32 and GFR 23 (recent baseline creatinine 1.39 on 04/17/2022), abnormal liver function (ALP 89, AST 138, ALT 53, total bilirubin 1.8).  Temperature normal, soft blood pressure 86/61 which improved to 100/83 after giving 750 cc LR bolus in ED --> 98/70, heart rate 69 --> 103, RR 16-->23, oxygen saturation 98% on room air.  Chest x-ray negative.  Images are negative for acute injury including CT of head, CT of C-spine, x-ray of her bilateral ankles, x-ray of left forearm.  Right lower extremity venous Doppler negative for DVT but showed Baker's cyst.  Patient is admitted to telemetry bed as inpatient"  Patient made comfort care on 05/28/2022.  5/9.  Patient complains  of left shoulder pain.  Resistant with me moving around.  Will give a dose of Solu-Medrol and Tylenol and see if that improves his discomfort.  Assessment and Plan: * End of life care Comfort care initiated afternoon 05/28/22.  Currently not a candidate for hospice home.  TOC to look into other options.  Left shoulder pain Will give a dose of Solu-Medrol just in case inflammatory and try Tylenol.  Severe sepsis (HCC) Due to RLE Cellulitis Strep pyogenes Bacteremia With tachycardia HR 103 and tachypnea RR 23.  No fever or leukocytosis.  Lactic acidosis, AKI, transaminitis consistent with organ dysfunction and severe sepsis.  --Concern for Toxic Shock Syndrome with with AKI, transaminitis, coagulopathy, hypotension --ID started IVIG on 5/2 -- Patient was made comfort care on 05/28/2022 and antibiotics were stopped.   Acute renal failure superimposed on stage 3a chronic kidney disease (HCC) Cr on admission 2.69.  Last creatinine 1.76   Cellulitis of right lower extremity See Severe Sepsis  Chronic systolic CHF (congestive heart failure) (HCC) Echo on 04/12/2022 showed EF<20%.  On comfort care measures.  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    Elevated lactic acid level Due to severe sepsis. Lactate normalized 1.3 (on 5/4)   Acute metabolic encephalopathy Due to severe sepsis  Hyponatremia Last sodium 129 on 05/28/2022  History of pulmonary embolism Stopped  Eliquis  PAF (paroxysmal atrial fibrillation) (HCC) Stopped Eliquis, amiodarone with comfort care measures  PAD (peripheral artery disease) (HCC) Stop medication with comfort care measures  Hyperkalemia K was 5.3 on admission   Diarrhea Resolved.  Hyperlipidemia Off Lipitor  Hypertension Off medications  Hydronephrosis of left kidney Acute Urinary Retention CT  abd/pelvis on admission 5/1 showed significant LEFT hydronephrosis and hydroureter down to bladder, no obstructing stone or mass seen. -Continue Foley catheter  Prolonged QT interval No further monitoring  Thrombocytopenia (HCC) Last platelet count 72.  Likely with severe sepsis.  CKD stage 3 due to type 2 diabetes mellitus (HCC) Chronic disease stage IIIa.  Off medications with comfort care measures        Subjective: Patient states he wants to get out of the hospital.  He wants to go back to his assisted living.  He has not walked since being in the hospital.  Complains of left shoulder pain.  Physical Exam: Vitals:   05/31/22 0004 05/31/22 0848 05/31/22 2300 06/01/22 0856  BP: 113/81 108/74 (!) 122/55 (!) 102/59  Pulse: 97 83 95 91  Resp: 20 16 18 17   Temp: (!) 97.5 F (36.4 C) (!) 97.5 F (36.4 C) 98.3 F (36.8 C) (!) 97.5 F (36.4 C)  TempSrc:   Oral Oral  SpO2: 98% 98% 98% 96%  Weight:      Height:       Physical Exam HENT:     Head: Normocephalic.     Mouth/Throat:     Pharynx: No oropharyngeal exudate.  Eyes:     General: Lids are normal.     Conjunctiva/sclera: Conjunctivae normal.  Cardiovascular:     Rate and Rhythm: Normal rate and regular rhythm.     Heart sounds: Normal heart sounds, S1 normal and S2 normal.  Pulmonary:     Breath sounds: No decreased breath sounds, wheezing, rhonchi or rales.  Abdominal:     Palpations: Abdomen is soft.     Tenderness: There is no abdominal tenderness.  Musculoskeletal:     Left shoulder: Tenderness present. Decreased range of motion.     Right lower leg: Swelling present.     Left lower leg: Swelling present.  Skin:    General: Skin is warm.     Findings: No rash.  Neurological:     Mental Status: He is alert.     Data Reviewed: No new data  Family Communication: Left message for patient's daughter  Disposition: Status is: Inpatient Remains inpatient appropriate because: Looking into options   Planned Discharge Destination: TOC looking into options since not a current candidate for hospice home.    Time spent: 28 minutes  Author: Alford Highland, MD 06/01/2022 2:34 PM  For on call review [email or web address].

## 2022-06-02 ENCOUNTER — Ambulatory Visit: Payer: Medicare PPO | Admitting: Medical

## 2022-06-02 DIAGNOSIS — M25512 Pain in left shoulder: Secondary | ICD-10-CM | POA: Diagnosis not present

## 2022-06-02 DIAGNOSIS — E440 Moderate protein-calorie malnutrition: Secondary | ICD-10-CM | POA: Insufficient documentation

## 2022-06-02 DIAGNOSIS — L03115 Cellulitis of right lower limb: Secondary | ICD-10-CM | POA: Diagnosis not present

## 2022-06-02 DIAGNOSIS — Z515 Encounter for palliative care: Secondary | ICD-10-CM | POA: Diagnosis not present

## 2022-06-02 DIAGNOSIS — A419 Sepsis, unspecified organism: Secondary | ICD-10-CM | POA: Diagnosis not present

## 2022-06-02 NOTE — Progress Notes (Signed)
Initial Nutrition Assessment  DOCUMENTATION CODES:   Non-severe (moderate) malnutrition in context of chronic illness  INTERVENTION:   -Feeding assistance with meals -48 hour calorie count per MD  NUTRITION DIAGNOSIS:   Moderate Malnutrition related to chronic illness (CHF) as evidenced by mild fat depletion, moderate fat depletion, mild muscle depletion, moderate muscle depletion.  GOAL:   Patient will meet greater than or equal to 90% of their needs  MONITOR:   PO intake, Supplement acceptance  REASON FOR ASSESSMENT:   Consult Calorie Count  ASSESSMENT:   Pt with medical history significant of hypertension, hyperlipidemia, PVD, CAD, CHF with EF<20%, gout, CKD-3a, atrial fibrillation and PE on Eliquis, who presents with fall, right lower leg pain.  Pt admitted with severe sepsis secondary to RLE cellulitis.   Reviewed I/O's: -450 ml x 24 hours and -1.4 L since admission   Pt has been on comfort care since 5/5/243.   Noted order for calorie count; end of life order set ordered. Case discussed with MD; would like to continue with calorie count to see if this improves pt chances of qualifying for residential hospice.   Pt sitting up in bed at time of visit, smiled at this RD when greeted. No family at bedside. Pt reports feeling good today. Noted lunch tray on bedside table- pt consumed 100% of sweet tea and 25% of pudding (approximately 143 kcals, 1 gram protein). Noted minimal oral intake over the past 24 hours (po 0%).   Pt complained during visit that he was unable to lift his arms due to discomfort. RD will add feeding assistance at meals. Pt shares he fed himself, however, unable to lift his arm to indicate to this RD which arm was his primary arm that he uses for feeding.   No wt loss noted over the past month.   5/8-5/9 Breakfast: 0% completion Lunch: 0% completion Dinner: nothing documented  Total intake: 0 kcal (0% of minimum estimated needs)  0 protein  (0% of minimum estimated needs)  5/9-5/10 Breakfast: 0% completion Lunch: 143 kcals, 1 gram protein Dinner: nothing recorded  Total intake: 143 kcal (8% of minimum estimated needs)  1 gram protein (1% of minimum estimated needs)  Pt has not met nutritional needs over the past 48 hours in order to sustain himself. Suspect nutritional decline due to end of life care. RD will continue calorie count per MD request.   Labs reviewed: Na: 129, CBGS: 87.   NUTRITION - FOCUSED PHYSICAL EXAM:  Flowsheet Row Most Recent Value  Orbital Region Mild depletion  Upper Arm Region Moderate depletion  Thoracic and Lumbar Region Mild depletion  Buccal Region Moderate depletion  Temple Region Moderate depletion  Clavicle Bone Region Mild depletion  Clavicle and Acromion Bone Region Mild depletion  Scapular Bone Region Mild depletion  Dorsal Hand Moderate depletion  Patellar Region Moderate depletion  Anterior Thigh Region Moderate depletion  Posterior Calf Region Moderate depletion  Edema (RD Assessment) Mild  Hair Reviewed  Eyes Reviewed  Mouth Reviewed  Skin Reviewed  Nails Reviewed       Diet Order:   Diet Order             Diet regular Room service appropriate? Yes; Fluid consistency: Thin  Diet effective now                   EDUCATION NEEDS:   Not appropriate for education at this time  Skin:  Skin Assessment: Reviewed RN Assessment  Last BM:  06/01/22 (type 5)  Height:   Ht Readings from Last 1 Encounters:  05/24/22 5\' 11"  (1.803 m)    Weight:   Wt Readings from Last 1 Encounters:  05/26/22 84 kg    Ideal Body Weight:  78.2 kg  BMI:  Body mass index is 25.83 kg/m.  Estimated Nutritional Needs:   Kcal:  1850-2050  Protein:  100-115 grams  Fluid:  > 1.8 L    Levada Schilling, RD, LDN, CDCES Registered Dietitian II Certified Diabetes Care and Education Specialist Please refer to [facility] for RD and/or RD on-call/weekend/after hours pager

## 2022-06-02 NOTE — Assessment & Plan Note (Signed)
Calorie count

## 2022-06-02 NOTE — TOC Progression Note (Signed)
Transition of Care [facility]) - Progression Note    Patient Details  Name: Curtis Wagner MRN: [id number] Date of Birth: [birth year]  [facility]) CM/SW Contact  Liliana Cline, LCSW Phone Number: 06/02/2022, 4:03 PM  Clinical Narrative:    Cammy Copa from [facility] inquired if patient qualifies for hospice home. Informed her that per chart review, patient did not on 5/7. Per Cammy Copa, patient has declined since then and family would like patient to be reevaluated. Notified MD and Misty with [facility].        Expected Discharge Plan and Services                                               Social Determinants of Health (SDOH) Interventions SDOH Screenings   Food Insecurity: No Food Insecurity (05/24/2022)  Housing: Low Risk  (05/24/2022)  Transportation Needs: No Transportation Needs (05/24/2022)  Utilities: Not At Risk (05/24/2022)  Depression (PHQ2-9): Medium Risk (05/04/2022)  Tobacco Use: Medium Risk (05/24/2022)    Readmission Risk Interventions     No data to display

## 2022-06-02 NOTE — Care Management Important Message (Signed)
Important Message  Patient Details  Name: Curtis Wagner MRN: [id number] Date of Birth: 08-01-1940   Medicare Important Message Given:  Other (see comment)  Patient is on comfort care and out of respect for the patient and family no Important Message from [facility] given.   Olegario Messier A Mouhamad Teed 06/02/2022, 10:09 AM

## 2022-06-02 NOTE — Plan of Care (Signed)
  Problem: Nutrition: Goal: Adequate nutrition will be maintained Outcome: Progressing   Problem: Elimination: Goal: Will not experience complications related to bowel motility Outcome: Progressing   Problem: Pain Managment: Goal: General experience of comfort will improve Outcome: Progressing   

## 2022-06-02 NOTE — Progress Notes (Signed)
Progress Note   Patient: Alyxander Malonzo ZOX:[id number] DOB: September 07, 1940 DOA: 05/24/2022     9 DOS: the patient was seen and examined on 06/02/2022   Brief hospital course: HPI on admission 05/24/22 by Dr. Clyde Lundborg: "Steadman Manglicmot is a 82 y.o. 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   Per her daughter at the bedside, patient fell in the early morning at about 3 AM.  He is supposed to use Boran, but did not use it. No loss of consciousness.  Patient has abrasion in forehead.  No unilateral numbness or tinglings in extremities.  No facial droop or slurred speech.  Patient does not have chest pain, cough, shortness of breath.  No nausea, vomiting or abdominal pain.  Patient has diarrhea in the past several days, several watery diarrhea each day.  No fever or chills.  No symptoms of UTI.  Patient reports right lower leg pain which has been going on for about 3 days, also has swelling and erythema in left lower leg.   Data reviewed independently and ED Course: pt was found to have WBC 7.2, BNP 2406, lactic acid 4.5, troponin level 22, 21, potassium 5.3, worsening renal function with creatinine 2.69, BUN 32 and GFR 23 (recent baseline creatinine 1.39 on 04/17/2022), abnormal liver function (ALP 89, AST 138, ALT 53, total bilirubin 1.8).  Temperature normal, soft blood pressure 86/61 which improved to 100/83 after giving 750 cc LR bolus in ED --> 98/70, heart rate 69 --> 103, RR 16-->23, oxygen saturation 98% on room air.  Chest x-ray negative.  Images are negative for acute injury including CT of head, CT of C-spine, x-ray of her bilateral ankles, x-ray of left forearm.  Right lower extremity venous Doppler negative for DVT but showed Baker's cyst.  Patient is admitted to telemetry bed as inpatient"  Patient made comfort care on 05/28/2022.  5/9.  Patient complains  of left shoulder pain.  Resistant with me moving around.  Will give a dose of Solu-Medrol and Tylenol and see if that improves his discomfort. 5/10.  Patient not eating, will get calorie count.  Assessment and Plan: * End of life care Comfort care initiated afternoon 05/28/22.  Currently not a candidate for hospice home.  TOC to look into other options.  Calorie count.  Left shoulder pain Given on dose of solumedrol on 5/9.  Tylenol as needed.  Severe sepsis (HCC) Due to RLE Cellulitis Strep pyogenes Bacteremia With tachycardia HR 103 and tachypnea RR 23.  No fever or leukocytosis.  Lactic acidosis, AKI, transaminitis consistent with organ dysfunction and severe sepsis.  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   Acute renal failure superimposed on stage 3a chronic kidney disease (HCC) Cr on admission 2.69.  Last creatinine 1.76   Cellulitis of right lower extremity See Severe Sepsis  Chronic systolic CHF (congestive heart failure) (HCC) Echo on 04/12/2022 showed EF<20%.  On comfort care measures.  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    Elevated lactic acid level Due to severe sepsis. Lactate normalized 1.3 (on 5/4)   Acute metabolic encephalopathy Due to severe sepsis  Hyponatremia Last sodium  129 on 05/28/2022  History of pulmonary embolism Stopped Eliquis  PAF (paroxysmal atrial fibrillation) (HCC) Stopped Eliquis, amiodarone with comfort care measures  PAD (peripheral artery disease) (HCC) Stop medication with comfort care measures  Hyperkalemia K was 5.3 on admission   Diarrhea Resolved.  Hyperlipidemia Off Lipitor  Hypertension Off medications  Hydronephrosis  of left kidney Acute Urinary Retention CT abd/pelvis on admission 5/1 showed significant LEFT hydronephrosis and hydroureter down to bladder, no obstructing stone or mass seen. -Continue Foley catheter  Prolonged QT interval No further monitoring  Thrombocytopenia (HCC) Last platelet count 72.  Likely with severe sepsis.  CKD stage 3 due to type 2 diabetes mellitus (HCC) Chronic disease stage IIIa.  Off medications with comfort care measures        Subjective: Patient not eating.  Did not eat much lunch at all.  Feels okay today.  Offers no complaints. On comfort care. Admitted with severe sepsis.  Physical Exam: Vitals:   05/31/22 0848 05/31/22 2300 06/01/22 0856 06/02/22 0828  BP: 108/74 (!) 122/55 (!) 102/59 96/84  Pulse: 83 95 91 89  Resp: 16 18 17 18   Temp: (!) 97.5 F (36.4 C) 98.3 F (36.8 C) (!) 97.5 F (36.4 C) 97.6 F (36.4 C)  TempSrc:  Oral Oral   SpO2: 98% 98% 96% 98%  Weight:      Height:       Physical Exam HENT:     Head: Normocephalic.     Mouth/Throat:     Pharynx: No oropharyngeal exudate.  Eyes:     General: Lids are normal.     Conjunctiva/sclera: Conjunctivae normal.  Cardiovascular:     Rate and Rhythm: Normal rate and regular rhythm.     Heart sounds: Normal heart sounds, S1 normal and S2 normal.  Pulmonary:     Breath sounds: No decreased breath sounds, wheezing, rhonchi or rales.  Abdominal:     Palpations: Abdomen is soft.     Tenderness: There is no abdominal tenderness.  Musculoskeletal:     Left shoulder: Tenderness present. Decreased range of motion.     Right lower leg: Swelling present.     Left lower leg: Swelling present.  Skin:    General: Skin is warm.     Findings: No rash.  Neurological:     Mental Status: He is alert.     Data Reviewed: No new data  Family Communication: Spoke with son and daughter-in-law at the bedside  Disposition: Status is: Inpatient Remains inpatient appropriate because: Will get a  calorie count.  Not taking in much intake.  Currently not a candidate for hospice home.  Unable to go back to his place of residence  Planned Discharge Destination: To be determined    Time spent: 28 minutes  Author: Alford Highland, MD 06/02/2022 5:52 PM  For on call review [email or web address].

## 2022-06-03 DIAGNOSIS — I959 Hypotension, unspecified: Secondary | ICD-10-CM

## 2022-06-03 DIAGNOSIS — E440 Moderate protein-calorie malnutrition: Secondary | ICD-10-CM | POA: Diagnosis not present

## 2022-06-03 DIAGNOSIS — E1122 Type 2 diabetes mellitus with diabetic chronic kidney disease: Secondary | ICD-10-CM | POA: Diagnosis not present

## 2022-06-03 DIAGNOSIS — A419 Sepsis, unspecified organism: Secondary | ICD-10-CM | POA: Diagnosis not present

## 2022-06-03 DIAGNOSIS — L03115 Cellulitis of right lower limb: Secondary | ICD-10-CM | POA: Diagnosis not present

## 2022-06-03 DIAGNOSIS — G8929 Other chronic pain: Secondary | ICD-10-CM

## 2022-06-03 DIAGNOSIS — M25512 Pain in left shoulder: Secondary | ICD-10-CM | POA: Diagnosis not present

## 2022-06-03 DIAGNOSIS — Z515 Encounter for palliative care: Secondary | ICD-10-CM | POA: Diagnosis not present

## 2022-06-03 MED ORDER — ORAL CARE MOUTH RINSE: 15.0000 mL | OROMUCOSAL | Status: DC | PRN

## 2022-06-03 NOTE — Progress Notes (Signed)
Daily Progress Note   Patient Name: Curtis Wagner       Date: 06/03/2022 DOB: September 13, 1940  Age: 82 y.o. MRN#: [id number] Attending Physician: Alford Highland, MD Primary Care Physician: Margaretann Loveless, MD Admit Date: 05/24/2022  Reason for Consultation/Follow-up: Establishing goals of care  HPI/Brief Hospital Review: 82 y.o. male  with past medical history of HTN, HLD, PVD, CAD, right atrial thrombus, persistent A. Fib and PE on Eliquis, DVT, CKD stage 3a admitted from home on 05/24/2022 with fall with complaints of associated right lower leg pain that initially started 3 days prior.   Noted abrasion to forehead. No reports of loss of consciousness. Imaging negative for acute injury.   Being treated for severe sepsis secondary to RLE cellulitis, ARF superimposed on stage 3a CKD, hyponatremia, urinary retention, and metabolic encephalopathy.   Noted recent hospital admission in March 2024. Curtis Wagner familiar to our service as we were consulted and followed during his previous admission.   Palliative medicine was consulted for assisting with goals of care conversations.    5/5, family decided to proceed with comfort care and evaluated for appropriate hospice services.    Subjective: Extensive chart review has been completed prior to meeting patient including labs, vital signs, imaging, progress notes, orders, and available advanced directive documents from current and previous encounters.     Visited with Curtis Wagner at his bedside. Son and daughter in law present during visit. Misty, RN with [facility] also present at bedside. Issue remains Curtis Wagner at this time is not appropriate for IPU. TOC continues to work on disposition. Family considering if Curtis Wagner could return to [facility] with  hospice services following. Family aware [facility] would have to accept patient and be able to provide level of care Curtis Wagner requires at this time. Will connect with TOC for further assistance. Spoke with daughter-[name] she is also willing to connect with administrator from [employer] regarding her father returning with hospice services following.  Answered and addressed all questions and concerns. PMT to continue to follow for ongoing needs and support.  Objective:  Vital Signs: BP 94/77 (BP Location: Right Arm)   Pulse 90   Temp 98.4 F (36.9 C) (Oral)   Resp 18   Ht 5\' 11"  (1.803 m)   Wt 84 kg   SpO2  97%   BMI 25.83 kg/m  SpO2: SpO2: 97 % O2 Device: O2 Device: Room Air O2 Flow Rate:     Palliative Care Assessment & Plan   Care plan was discussed with TOC and [facility] liaison.  Thank you for allowing the Palliative Medicine Team to assist in the care of this patient.  Total time:  25 minutes  Time spent includes: Detailed review of medical records (labs, imaging, vital signs), medically appropriate exam (mental status, respiratory, cardiac, skin), discussed with treatment team, counseling and educating patient, family and staff, documenting clinical information, medication management and coordination of care.  Leeanne Deed, DNP, AGNP-C Palliative Medicine   Please contact Palliative Medicine Team phone at [phone] for questions and concerns.

## 2022-06-03 NOTE — Progress Notes (Signed)
Progress Note   Patient: Curtis Wagner NWG:[id number] DOB: 04/04/1940 DOA: 05/24/2022     10 DOS: the patient was seen and examined on 06/03/2022   Brief hospital course: HPI on admission 05/24/22 by Dr. Clyde Lundborg: "Peydon Biddy is a 82 y.o. 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   Per her daughter at the bedside, patient fell in the early morning at about 3 AM.  He is supposed to use Tien, but did not use it. No loss of consciousness.  Patient has abrasion in forehead.  No unilateral numbness or tinglings in extremities.  No facial droop or slurred speech.  Patient does not have chest pain, cough, shortness of breath.  No nausea, vomiting or abdominal pain.  Patient has diarrhea in the past several days, several watery diarrhea each day.  No fever or chills.  No symptoms of UTI.  Patient reports right lower leg pain which has been going on for about 3 days, also has swelling and erythema in left lower leg.   Data reviewed independently and ED Course: pt was found to have WBC 7.2, BNP 2406, lactic acid 4.5, troponin level 22, 21, potassium 5.3, worsening renal function with creatinine 2.69, BUN 32 and GFR 23 (recent baseline creatinine 1.39 on 04/17/2022), abnormal liver function (ALP 89, AST 138, ALT 53, total bilirubin 1.8).  Temperature normal, soft blood pressure 86/61 which improved to 100/83 after giving 750 cc LR bolus in ED --> 98/70, heart rate 69 --> 103, RR 16-->23, oxygen saturation 98% on room air.  Chest x-ray negative.  Images are negative for acute injury including CT of head, CT of C-spine, x-ray of her bilateral ankles, x-ray of left forearm.  Right lower extremity venous Doppler negative for DVT but showed Baker's cyst.  Patient is admitted to telemetry bed as inpatient"  Patient made comfort care on 05/28/2022.  5/9.  Patient  complains of left shoulder pain.  Resistant with me moving around.  Will give a dose of Solu-Medrol and Tylenol and see if that improves his discomfort. 5/10.  Patient not eating, will get calorie count. 5/11.  Hospice reevaluation, still not a candidate for hospice home.  TOC looking into options.  Calorie count underway.  Assessment and Plan: * End of life care Comfort care initiated afternoon 05/28/22.  Currently not a candidate for hospice home.  TOC to look into other options.  Calorie count.  Left shoulder pain Given on dose of solumedrol on 5/9.  Tylenol as needed.  Severe sepsis (HCC) Due to RLE Cellulitis Strep pyogenes Bacteremia With tachycardia HR 103 and tachypnea RR 23.  No fever or leukocytosis.  Lactic acidosis, AKI, transaminitis consistent with organ dysfunction and severe sepsis.  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   Acute renal failure superimposed on stage 3a chronic kidney disease (HCC) Cr on admission 2.69.  Last creatinine 1.76   Cellulitis of right lower extremity See Severe Sepsis  Chronic systolic CHF (congestive heart failure) (HCC) Echo on 04/12/2022 showed EF<20%.  On comfort care measures.  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    Elevated lactic acid level Due to  severe sepsis. Lactate normalized 1.3 (on 5/4)   Acute metabolic encephalopathy Due to severe sepsis  Hyponatremia Last sodium 129 on 05/28/2022  History of pulmonary embolism Stopped Eliquis  PAF (paroxysmal atrial fibrillation) (HCC) Stopped Eliquis, amiodarone with comfort care measures  PAD (peripheral artery disease) (HCC) Stop medication with comfort care  measures  Hyperkalemia K was 5.3 on admission   Diarrhea Resolved.  Hyperlipidemia Off Lipitor  Hypertension Off medications  Malnutrition of moderate degree Calorie count  Hydronephrosis of left kidney Acute Urinary Retention CT abd/pelvis on admission 5/1 showed significant LEFT hydronephrosis and hydroureter down to bladder, no obstructing stone or mass seen. -Continue Foley catheter  Prolonged QT interval No further monitoring  Thrombocytopenia (HCC) Last platelet count 72.  Likely with severe sepsis.  CKD stage 3 due to type 2 diabetes mellitus (HCC) Chronic disease stage IIIa.  Off medications with comfort care measures        Subjective: Patient awakened from sleep this morning.  Offers no complaints.  When I move his shoulder, he yells out.  Patient mentioned that he wanted some barbecue.  Physical Exam: Vitals:   06/01/22 0856 06/02/22 0828 06/02/22 2344 06/03/22 0810  BP:  96/84 104/82 99/79  Pulse:  89 91 86  Resp:  18 18 18   Temp:  97.6 F (36.4 C) 97.8 F (36.6 C) 98 F (36.7 C)  TempSrc: Oral   Oral  SpO2:  98% 97% 98%  Weight:      Height:       Physical Exam HENT:     Head: Normocephalic.     Mouth/Throat:     Pharynx: No oropharyngeal exudate.  Eyes:     General: Lids are normal.     Conjunctiva/sclera: Conjunctivae normal.  Cardiovascular:     Rate and Rhythm: Normal rate and regular rhythm.     Heart sounds: Normal heart sounds, S1 normal and S2 normal.  Pulmonary:     Breath sounds: No decreased breath sounds, wheezing, rhonchi or rales.  Abdominal:     Palpations: Abdomen is soft.     Tenderness: There is no abdominal tenderness.  Musculoskeletal:     Right lower leg: Swelling present.     Left lower leg: Swelling present.     Comments: Patient guarding with me trying to move his left shoulder around.  Skin:    General: Skin is warm.     Findings: No rash.  Neurological:     Mental Status: He is alert.     Data  Reviewed: No new data since comfort care.  Family Communication: Spoke with patient's daughter on the phone  Disposition: Status is: Inpatient Remains inpatient appropriate because: Transitional care team looking into options.  Hopefully will hear back on some options by Monday.  Still not a candidate for hospice facility.  Planned Discharge Destination: TOC looking into options    Time spent: 27 minutes  Author: Alford Highland, MD 06/03/2022 12:52 PM  For on call review [email or web address].

## 2022-06-03 NOTE — TOC Progression Note (Signed)
Transition of Care [facility]) - Progression Note    Patient Details  Name: Curtis Wagner MRN: [id number] Date of Birth: 1940-12-20  [facility]) CM/SW Contact  Liliana Cline, LCSW Phone Number: 06/03/2022, 12:39 PM  Clinical Narrative:    Per [facility], patient is still not a candidate for hospice home at this time.  Spoke to [place] with [facility] who states the family does plan to private pay for placement and she has several places that she is waiting to hear back if they can accept patient.         Expected Discharge Plan and Services                                               Social Determinants of Health (SDOH) Interventions SDOH Screenings   Food Insecurity: No Food Insecurity (05/24/2022)  Housing: Low Risk  (05/24/2022)  Transportation Needs: No Transportation Needs (05/24/2022)  Utilities: Not At Risk (05/24/2022)  Depression (PHQ2-9): Medium Risk (05/04/2022)  Tobacco Use: Medium Risk (05/24/2022)    Readmission Risk Interventions     No data to display

## 2022-06-03 NOTE — Progress Notes (Addendum)
[facility]) Hospice hospital liaison  [facility] liaison asked to re-assess patient for the hospice home. Met in room with patient, his son and daughter in law and Ladona Ridgel with the PMT.   Patient awake and alert and able to carry on a conversation with some confusion noted. He continues to eat and drink with assistance and reports he just wants to go back to his home.   At this time patient does not meet criteria for the hospice inpatient unit.   Liaison will continue to follow and offer assistance once disposition is determined.   Thank you for the opportunity to participate in this patient's care Thea Gist, BSN, RN Hospice hospital liaison  [phone]

## 2022-06-04 DIAGNOSIS — N170 Acute kidney failure with tubular necrosis: Secondary | ICD-10-CM | POA: Diagnosis not present

## 2022-06-04 DIAGNOSIS — I959 Hypotension, unspecified: Secondary | ICD-10-CM | POA: Diagnosis not present

## 2022-06-04 DIAGNOSIS — Z515 Encounter for palliative care: Secondary | ICD-10-CM | POA: Diagnosis not present

## 2022-06-04 DIAGNOSIS — L03115 Cellulitis of right lower limb: Secondary | ICD-10-CM | POA: Diagnosis not present

## 2022-06-04 DIAGNOSIS — A419 Sepsis, unspecified organism: Secondary | ICD-10-CM | POA: Diagnosis not present

## 2022-06-04 DIAGNOSIS — M25512 Pain in left shoulder: Secondary | ICD-10-CM | POA: Diagnosis not present

## 2022-06-04 DIAGNOSIS — E785 Hyperlipidemia, unspecified: Secondary | ICD-10-CM

## 2022-06-04 NOTE — Progress Notes (Signed)
Daily Progress Note   Patient Name: Curtis Wagner       Date: 06/04/2022 DOB: 1940/03/12  Age: 82 y.o. MRN#: [id number] Attending Physician: Alford Highland, MD Primary Care Physician: Margaretann Loveless, MD Admit Date: 05/24/2022  Reason for Consultation/Follow-up: Establishing goals of care  HPI/Brief Hospital Review: 82 y.o. male  with past medical history of HTN, HLD, PVD, CAD, right atrial thrombus, persistent A. Fib and PE on Eliquis, DVT, CKD stage 3a admitted from home on 05/24/2022 with fall with complaints of associated right lower leg pain that initially started 3 days prior.   Noted abrasion to forehead. No reports of loss of consciousness. Imaging negative for acute injury.   Being treated for severe sepsis secondary to RLE cellulitis, ARF superimposed on stage 3a CKD, hyponatremia, urinary retention, and metabolic encephalopathy.   Noted recent hospital admission in March 2024. Curtis Wagner familiar to our service as we were consulted and followed during his previous admission.   Palliative medicine was consulted for assisting with goals of care conversations.    5/5, family decided to proceed with comfort care and evaluated for appropriate hospice services.   Subjective: Extensive chart review has been completed prior to meeting patient including labs, vital signs, imaging, progress notes, orders, and available advanced directive documents from current and previous encounters.    Visited with Curtis Wagner at his bedside. Awake and alert, remains pleasantly confused. Attempting to get out of bed and he wishes to go home, easily redirected.  Met with daughter-[name] at bedside. She was able to send an email to Ed-Administrator at [facility] regarding Curtis Wagner's ability to return to [facility]  ALF with hospice services following. She has not yet received a response. She remains hopeful that this will be an appropriate option for her dad.  TOC continues to assist with discharge disposition as well.  Answered and addressed all questions and concerns.  Objective:  Vital Signs: BP 105/70 (BP Location: Left Arm)   Pulse 91   Temp 97.8 F (36.6 C) (Oral)   Resp 17   Ht 5\' 11"  (1.803 m)   Wt 84 kg   SpO2 99%   BMI 25.83 kg/m  SpO2: SpO2: 99 % O2 Device: O2 Device: Room Air O2 Flow Rate:     Palliative Care Assessment & Plan  Assessment/Recommendation/Plan  DNR Comfort measures TOC assisting with discharge disposition Daughter interested in returning to [facility]F if able PMT to continue to follow for ongoing needs and support  Thank you for allowing the Palliative Medicine Team to assist in the care of this patient.  Total time:  25 minutes  Time spent includes: Detailed review of medical records (labs, imaging, vital signs), medically appropriate exam (mental status, respiratory, cardiac, skin), discussed with treatment team, counseling and educating patient, family and staff, documenting clinical information, medication management and coordination of care.  Leeanne Deed, DNP, AGNP-C Palliative Medicine   Please contact Palliative Medicine Team phone at [phone] for questions and concerns.

## 2022-06-04 NOTE — Progress Notes (Signed)
Progress Note   Patient: Curtis Wagner ZOX:[id number] DOB: November 10, 1940 DOA: 05/24/2022     11 DOS: the patient was seen and examined on 06/04/2022   Brief hospital course: HPI on admission 05/24/22 by Dr. Clyde Lundborg: "Thadis Brodie is a 82 y.o. 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   Per her daughter at the bedside, patient fell in the early morning at about 3 AM.  He is supposed to use Laurie, but did not use it. No loss of consciousness.  Patient has abrasion in forehead.  No unilateral numbness or tinglings in extremities.  No facial droop or slurred speech.  Patient does not have chest pain, cough, shortness of breath.  No nausea, vomiting or abdominal pain.  Patient has diarrhea in the past several days, several watery diarrhea each day.  No fever or chills.  No symptoms of UTI.  Patient reports right lower leg pain which has been going on for about 3 days, also has swelling and erythema in left lower leg.   Data reviewed independently and ED Course: pt was found to have WBC 7.2, BNP 2406, lactic acid 4.5, troponin level 22, 21, potassium 5.3, worsening renal function with creatinine 2.69, BUN 32 and GFR 23 (recent baseline creatinine 1.39 on 04/17/2022), abnormal liver function (ALP 89, AST 138, ALT 53, total bilirubin 1.8).  Temperature normal, soft blood pressure 86/61 which improved to 100/83 after giving 750 cc LR bolus in ED --> 98/70, heart rate 69 --> 103, RR 16-->23, oxygen saturation 98% on room air.  Chest x-ray negative.  Images are negative for acute injury including CT of head, CT of C-spine, x-ray of her bilateral ankles, x-ray of left forearm.  Right lower extremity venous Doppler negative for DVT but showed Baker's cyst.  Patient is admitted to telemetry bed as inpatient"  Patient made comfort care on 05/28/2022.  5/9.  Patient  complains of left shoulder pain.  Resistant with me moving around.  Will give a dose of Solu-Medrol and Tylenol and see if that improves his discomfort. 5/10.  Patient not eating, will get calorie count. 5/11.  Hospice reevaluation, still not a candidate for hospice home.  TOC looking into options.  Calorie count underway. 5/12.  TOC still looking into placement options.  Assessment and Plan: * End of life care Comfort care initiated in the afternoon of 05/28/22.  Currently not a candidate for hospice home.  Calorie count.  Left shoulder pain Given on dose of solumedrol on 5/9.  Tylenol as needed.  Severe sepsis (HCC) Due to RLE Cellulitis Strep pyogenes Bacteremia With tachycardia HR 103 and tachypnea RR 23.  No fever or leukocytosis.  Lactic acidosis, AKI, transaminitis consistent with organ dysfunction and severe sepsis.  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   Acute renal failure superimposed on stage 3a chronic kidney disease (HCC) Cr on admission 2.69.  Last creatinine 1.76   Cellulitis of right lower extremity See Severe Sepsis  Chronic systolic CHF (congestive heart failure) (HCC) Echo on 04/12/2022 showed EF<20%.  On comfort care measures.  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    Elevated lactic  acid level Due to severe sepsis. Lactate normalized 1.3 (on 5/4)   Acute metabolic encephalopathy Due to severe sepsis  Hyponatremia Last sodium 129 on 05/28/2022  History of pulmonary embolism Stopped Eliquis  PAF (paroxysmal atrial fibrillation) (HCC) Stopped Eliquis, amiodarone with comfort care measures  PAD (peripheral artery disease) (HCC) Stopped medication with comfort  care measures  Hyperkalemia K was 5.3 on admission   Diarrhea Resolved.  Hyperlipidemia Off Lipitor  Hypertension Off medications  Malnutrition of moderate degree Calorie count  Hydronephrosis of left kidney Acute Urinary Retention CT abd/pelvis on admission 5/1 showed significant LEFT hydronephrosis and hydroureter down to bladder, no obstructing stone or mass seen. -Continue Foley catheter  Prolonged QT interval No further monitoring  Thrombocytopenia (HCC) Last platelet count 72.  Likely with severe sepsis.  CKD stage 3 due to type 2 diabetes mellitus (HCC) Chronic disease stage IIIa.  Off medications with comfort care measures        Subjective: Patient awakened from sleep and felt okay.  Offers no complaints.  Patient initially admitted for sepsis.  Physical Exam: Vitals:   06/03/22 0810 06/03/22 1545 06/03/22 2326 06/04/22 0815  BP: 99/79 94/77 109/75 105/70  Pulse: 86 90 95 91  Resp: 18 18 19 17   Temp: 98 F (36.7 C) 98.4 F (36.9 C) (!) 97.5 F (36.4 C) 97.8 F (36.6 C)  TempSrc: Oral Oral  Oral  SpO2: 98% 97% 98% 99%  Weight:      Height:       Physical Exam HENT:     Head: Normocephalic.     Mouth/Throat:     Pharynx: No oropharyngeal exudate.  Eyes:     General: Lids are normal.     Conjunctiva/sclera: Conjunctivae normal.  Cardiovascular:     Rate and Rhythm: Normal rate and regular rhythm.     Heart sounds: Normal heart sounds, S1 normal and S2 normal.  Pulmonary:     Breath sounds: No decreased breath sounds, wheezing, rhonchi or rales.  Abdominal:     Palpations: Abdomen is soft.     Tenderness: There is no abdominal tenderness.  Musculoskeletal:     Right lower leg: Swelling present.     Left lower leg: Swelling present.     Comments: Patient able to move his left arm on his own without yelling in pain.  Skin:    General: Skin is warm.     Findings: No rash.  Neurological:     Mental Status: He is alert.     Data  Reviewed: No new data  Family Communication: Left message for patient's daughter  Disposition: Status is: Inpatient Remains inpatient appropriate because: Still not a candidate for hospice home.  Looking into other options.  Planned Discharge Destination: To be determined    Time spent: 26 minutes  Author: Alford Highland, MD 06/04/2022 2:29 PM  For on call review [email or web address].

## 2022-06-04 NOTE — Progress Notes (Signed)
[facility] [address] [facility]) Hospital Liaison Note  Visited patient at bedside, patient sleeping, did not arouse to gentle calling of name. Banana at bedside appears to be eaten and liquids consumed.  Phone call to daughter, Derrek Monaco, who shares they are waiting to hear from Diamantina Monks if patient can return to Assisted Living rather than Independent Living.  [facility] continues to follow for discharge disposition and is available for support.  Please call with any hospice related questions or concerns.  Thank you, Haynes Bast, BSN, RN [facility] Liaison [phone]

## 2022-06-05 DIAGNOSIS — R410 Disorientation, unspecified: Secondary | ICD-10-CM | POA: Diagnosis not present

## 2022-06-05 DIAGNOSIS — I480 Paroxysmal atrial fibrillation: Secondary | ICD-10-CM | POA: Diagnosis not present

## 2022-06-05 DIAGNOSIS — W19XXXA Unspecified fall, initial encounter: Secondary | ICD-10-CM | POA: Diagnosis not present

## 2022-06-05 DIAGNOSIS — M25512 Pain in left shoulder: Secondary | ICD-10-CM | POA: Diagnosis not present

## 2022-06-05 DIAGNOSIS — E440 Moderate protein-calorie malnutrition: Secondary | ICD-10-CM | POA: Diagnosis not present

## 2022-06-05 DIAGNOSIS — Z515 Encounter for palliative care: Secondary | ICD-10-CM | POA: Diagnosis not present

## 2022-06-05 DIAGNOSIS — A419 Sepsis, unspecified organism: Secondary | ICD-10-CM | POA: Diagnosis not present

## 2022-06-05 NOTE — TOC Progression Note (Signed)
Transition of Care [facility]) - Progression Note    Patient Details  Name: Curtis Wagner MRN: [id number] Date of Birth: 17-Jun-1940  Transition of Care [facility]) CM/SW Contact  Marlowe Sax, RN Phone Number: 06/05/2022, 3:32 PM  Clinical Narrative:    [facility], [facility], and [facility] have extended bed offers for  long term care The family has declined [facility] but will reach out to the others        Expected Discharge Plan and Services                                               Social Determinants of Health (SDOH) Interventions SDOH Screenings   Food Insecurity: No Food Insecurity (05/24/2022)  Housing: Low Risk  (05/24/2022)  Transportation Needs: No Transportation Needs (05/24/2022)  Utilities: Not At Risk (05/24/2022)  Depression (PHQ2-9): Medium Risk (05/04/2022)  Tobacco Use: Medium Risk (05/24/2022)    Readmission Risk Interventions     No data to display

## 2022-06-05 NOTE — Progress Notes (Signed)
                                                     Palliative Care Progress Note, Assessment & Plan   Patient Name: Curtis Wagner       Date: 06/05/2022 DOB: May 30, 1940  Age: 82 y.o. MRN#: [id number] Attending Physician: Alford Highland, MD Primary Care Physician: Margaretann Loveless, MD Admit Date: 05/24/2022  Subjective: Patient is alert and able to make his wishes known.  He is eating his breakfast tray.  No family or friends present during my visit.  HPI: 82 y.o. male  with past medical history of HTN, HLD, PVD, CAD, right atrial thrombus, persistent A. Fib and PE on Eliquis, DVT, CKD stage 3a admitted from home on 05/24/2022 with fall with complaints of associated right lower leg pain that initially started 3 days prior.   Noted abrasion to forehead. No reports of loss of consciousness. Imaging negative for acute injury.   Being treated for severe sepsis secondary to RLE cellulitis, ARF superimposed on stage 3a CKD, hyponatremia, urinary retention, and metabolic encephalopathy.   Noted recent hospital admission in March 2024. Mr. Steinback familiar to our service as we were consulted and followed during his previous admission.   Palliative medicine was consulted for assisting with goals of care conversations.    5/5, family decided to proceed with comfort care and evaluated for appropriate hospice services.   Summary of counseling/coordination of care: After reviewing the patient's chart and assessing the patient at bedside, I spoke with patient in regards to symptom management.    He is alert to self and able to discuss pain and discomfort appropriately.  He denies pain, nausea, vomiting, diarrhea, or other discomfort at this time. We discussed that patient has medications as needed to address any symptoms that arise. He shares he feels well and does not  need anything at this time.   Comfort measures continue.  TOC following closely for discharge planning.  PMT will continue to follow and support patient throughout his hospitalization.  Physical Exam Vitals reviewed.  Constitutional:      General: He is not in acute distress.    Appearance: He is normal weight. He is not ill-appearing.  HENT:     Mouth/Throat:     Mouth: Mucous membranes are moist.  Cardiovascular:     Rate and Rhythm: Normal rate.  Pulmonary:     Effort: Pulmonary effort is normal.  Abdominal:     Palpations: Abdomen is soft.  Skin:    Findings: Bruising present.  Neurological:     Mental Status: He is alert.     Comments: Oriented to self  Psychiatric:        Mood and Affect: Mood normal.        Behavior: Behavior normal.             Total Time 25 minutes   Akirra Lacerda L. Manon Hilding, FNP-BC Palliative Medicine Team Team Phone # [phone]

## 2022-06-05 NOTE — Progress Notes (Signed)
Nutrition Follow-up  DOCUMENTATION CODES:   Non-severe (moderate) malnutrition in context of chronic illness  INTERVENTION:   -Feeding assistance with meals -D/c calorie count -RD to sign off, as pt is on comfort measures. Pt does not demonstrate ability to meet nutritional needs and suspect further nutritional decline, secondary to end of life care. RD to sign-off, as there is nothing further to offer  NUTRITION DIAGNOSIS:   Moderate Malnutrition related to chronic illness (CHF) as evidenced by mild fat depletion, moderate fat depletion, mild muscle depletion, moderate muscle depletion.  Ongoing  GOAL:   Patient will meet greater than or equal to 90% of their needs  Unmet  MONITOR:   PO intake, Supplement acceptance  REASON FOR ASSESSMENT:   Consult Calorie Count  ASSESSMENT:   Pt with medical history significant of hypertension, hyperlipidemia, PVD, CAD, CHF with EF<20%, gout, CKD-3a, atrial fibrillation and PE on Eliquis, who presents with fall, right lower leg pain.  Reviewed I/O's: -920 ml x 24 hours and -3.6 L since admission  UOP: 1.4 L x 24 hours  Pt has been on comfort care since 5/5/243.   MD requested calorie count to see if this improves pt chances of qualifying for residential hospice. Palliative care and hospice following; attempting to discharge pt back to ALF with hospice services.   Pt lying in bed at time of visit. He did not arouse to voice. No family at bedside.   No wt available since last visit.   5/8-5/9 Breakfast: 0% completion Lunch: 0% completion Dinner: nothing documented   Total intake: 0 kcal (0% of minimum estimated needs)  0 grams protein (0% of minimum estimated needs)   5/9-5/10 Breakfast: 0% completion Lunch: 143 kcals, 1 gram protein Dinner: nothing recorded   Total intake: 143 kcal (8% of minimum estimated needs)  1 gram protein (1% of minimum estimated needs)  5/11 Breakfast: 891 kcals, 29 grams protein Lunch:  0% completion Dinner: 0% completion   Total intake: 891 kcal (48% of minimum estimated needs)  29 grams protein (29% of minimum estimated needs)   5/12 Breakfast: 0% completion Lunch: 127 kcals, 6 grams protein Dinner: 0% completion   Total intake: 127 kcal (7% of minimum estimated needs)  9 grams protein (6% of minimum estimated needs)  Average Total intake: 290 kcal (8% of minimum estimated needs)  1 gram protein (1% of minimum estimated needs)  Pt has not met nutritional needs over the past 96 hours in order to sustain himself. Suspect nutritional decline due to end of life care. RD to d/c calorie count due to comfort care. Nothing further to add.   Medications reviewed.   Labs reviewed: Na: 129.   Diet Order:   Diet Order             Diet regular Room service appropriate? Yes; Fluid consistency: Thin  Diet effective now                   EDUCATION NEEDS:   Not appropriate for education at this time  Skin:  Skin Assessment: Reviewed RN Assessment  Last BM:  06/01/22 (type 5)  Height:   Ht Readings from Last 1 Encounters:  05/24/22 5\' 11"  (1.803 m)    Weight:   Wt Readings from Last 1 Encounters:  05/26/22 84 kg    Ideal Body Weight:  78.2 kg  BMI:  Body mass index is 25.83 kg/m.  Estimated Nutritional Needs:   Kcal:  1850-2050  Protein:  100-115 grams  Fluid:  > 1.8 L    Levada Schilling, RD, LDN, CDCES Registered Dietitian II Certified Diabetes Care and Education Specialist Please refer to [facility] for RD and/or RD on-call/weekend/after hours pager

## 2022-06-05 NOTE — Progress Notes (Signed)
Progress Note   Patient: Curtis Wagner ZOX:[id number] DOB: November 16, 1940 DOA: 05/24/2022     12 DOS: the patient was seen and examined on 06/05/2022   Brief hospital course: HPI on admission 05/24/22 by Dr. Clyde Lundborg: "[name] is a 82 y.o. 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   Per her daughter at the bedside, patient fell in the early morning at about 3 AM.  He is supposed to use [place], but did not use it. No loss of consciousness.  Patient has abrasion in forehead.  No unilateral numbness or tinglings in extremities.  No facial droop or slurred speech.  Patient does not have chest pain, cough, shortness of breath.  No nausea, vomiting or abdominal pain.  Patient has diarrhea in the past several days, several watery diarrhea each day.  No fever or chills.  No symptoms of UTI.  Patient reports right lower leg pain which has been going on for about 3 days, also has swelling and erythema in left lower leg.   Data reviewed independently and ED Course: pt was found to have WBC 7.2, BNP 2406, lactic acid 4.5, troponin level 22, 21, potassium 5.3, worsening renal function with creatinine 2.69, BUN 32 and GFR 23 (recent baseline creatinine 1.39 on 04/17/2022), abnormal liver function (ALP 89, AST 138, ALT 53, total bilirubin 1.8).  Temperature normal, soft blood pressure 86/61 which improved to 100/83 after giving 750 cc LR bolus in ED --> 98/70, heart rate 69 --> 103, RR 16-->23, oxygen saturation 98% on room air.  Chest x-ray negative.  Images are negative for acute injury including CT of head, CT of C-spine, x-ray of her bilateral ankles, x-ray of left forearm.  Right lower extremity venous Doppler negative for DVT but showed Baker's cyst.  Patient is admitted to telemetry bed as inpatient"  Patient made comfort care on 05/28/2022.  5/9.  Patient  complains of left shoulder pain.  Resistant with me moving around.  Will give a dose of Solu-Medrol and Tylenol and see if that improves his discomfort. 5/10.  Patient not eating, will get calorie count. 5/11.  Hospice reevaluation, still not a candidate for hospice home.  TOC looking into options.  Calorie count underway. 5/12.  TOC still looking into placement options. 5/13.  Patient telling a story that he was at a game last night.  Patient obviously was not at a game last night.  Assessment and Plan: * End of life care Comfort care initiated in the afternoon of 05/28/22.  Currently not a candidate for hospice home currently.  Calorie count shows that he is only eating 8% of his required nutritional needs.  Acute delirium Continue to monitor.  Patient on end-of-life care.  Left shoulder pain Given on dose of solumedrol on 5/9.  Tylenol as needed.  Severe sepsis (HCC) Due to RLE Cellulitis Strep pyogenes Bacteremia With tachycardia HR 103 and tachypnea RR 23.  No fever or leukocytosis.  Lactic acidosis, AKI, transaminitis consistent with organ dysfunction and severe sepsis.  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   Acute renal failure superimposed on stage 3a chronic kidney disease (HCC) Cr on admission 2.69.  Last creatinine 1.76   Cellulitis of right lower extremity See Severe Sepsis  Chronic systolic CHF (congestive  heart failure) (HCC) Echo on 04/12/2022 showed EF<20%.  On comfort care measures.  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    Elevated lactic acid level Due to severe sepsis. Lactate normalized 1.3 (on 5/4)   Acute metabolic encephalopathy Due to severe  sepsis  Hyponatremia Last sodium 129 on 05/28/2022  History of pulmonary embolism Stopped Eliquis  PAF (paroxysmal atrial fibrillation) (HCC) Stopped Eliquis, amiodarone with comfort care measures  PAD (peripheral artery disease) (HCC) Stopped medication with comfort care measures  Hyperkalemia K was 5.3 on admission   Diarrhea Resolved.  Hyperlipidemia Off Lipitor  Hypertension Off medications  Malnutrition of moderate degree Calorie count  Hydronephrosis of left kidney Acute Urinary Retention CT abd/pelvis on admission 5/1 showed significant LEFT hydronephrosis and hydroureter down to bladder, no obstructing stone or mass seen. -Continue Foley catheter  Prolonged QT interval No further monitoring  Thrombocytopenia (HCC) Last platelet count 72.  Likely with severe sepsis.  CKD stage 3 due to type 2 diabetes mellitus (HCC) Chronic disease stage IIIa.  Off medications with comfort care measures        Subjective: Patient was feeling okay.  He told the story that he was at a game last night and they got caught in traffic.  Obviously patient did not go to a game last night.  Physical Exam: Vitals:   06/03/22 2326 06/04/22 0815 06/04/22 2024 06/05/22 0836  BP: 109/75 105/70 107/85 109/81  Pulse: 95 91 99 93  Resp: 19 17 16 17   Temp: (!) 97.5 F (36.4 C) 97.8 F (36.6 C) 97.6 F (36.4 C) (!) 97.5 F (36.4 C)  TempSrc:  Oral    SpO2: 98% 99% 95% 100%  Weight:      Height:       Physical Exam HENT:     Head: Normocephalic.     Mouth/Throat:     Pharynx: No oropharyngeal exudate.  Eyes:     General: Lids are normal.     Conjunctiva/sclera: Conjunctivae normal.  Cardiovascular:     Rate and Rhythm: Normal rate and regular rhythm.     Heart sounds: Normal heart sounds, S1 normal and S2 normal.  Pulmonary:     Breath sounds: No decreased breath sounds, wheezing, rhonchi or rales.  Abdominal:     Palpations: Abdomen is soft.     Tenderness: There is  no abdominal tenderness.  Musculoskeletal:     Right lower leg: Swelling present.     Left lower leg: Swelling present.     Comments: Patient able to move his left arm on his own without yelling in pain.  Skin:    General: Skin is warm.     Findings: No rash.  Neurological:     Mental Status: He is alert.     Data Reviewed: No new data  Family Communication: Updated daughter on the phone  Disposition: Status is: Inpatient Remains inpatient appropriate because: Patient's daughter looking into options of next place to be.  TOC looking into options.  Planned Discharge Destination: To be determined.  Not a candidate for hospice home.    Time spent: 27 minutes  Author: Alford Highland, MD 06/05/2022 2:16 PM  For on call review [email or web address].

## 2022-06-05 NOTE — Plan of Care (Signed)
  Problem: Nutrition: Goal: Adequate nutrition will be maintained Outcome: Progressing   Problem: Coping: Goal: Level of anxiety will decrease 06/05/2022 1844 by Alver Fisher, RN Outcome: Progressing 06/05/2022 1843 by Tereasa Coop D, RN Outcome: Progressing   Problem: Coping: Goal: Level of anxiety will decrease 06/05/2022 1844 by Alver Fisher, RN Outcome: Progressing 06/05/2022 1843 by Tereasa Coop D, RN Outcome: Progressing   Problem: Elimination: Goal: Will not experience complications related to bowel motility Outcome: Progressing Goal: Will not experience complications related to urinary retention Outcome: Progressing

## 2022-06-05 NOTE — TOC Progression Note (Addendum)
Transition of Care [facility]) - Progression Note    Patient Details  Name: Curtis Wagner MRN: [id number] Date of Birth: 12-Apr-1940  Transition of Care [facility]) CM/SW Contact  Marlowe Sax, RN Phone Number: 06/05/2022, 11:01 AM  Clinical Narrative:    Received a call from Hospice stating that they still feel that the patient is not appropriate for their inpatient hospice facility and they are signing off, if needed they requested to get a new consult  I reached out to [name] and left a general VM asking for a call back      Expected Discharge Plan and Services                                               Social Determinants of Health (SDOH) Interventions SDOH Screenings   Food Insecurity: No Food Insecurity (05/24/2022)  Housing: Low Risk  (05/24/2022)  Transportation Needs: No Transportation Needs (05/24/2022)  Utilities: Not At Risk (05/24/2022)  Depression (PHQ2-9): Medium Risk (05/04/2022)  Tobacco Use: Medium Risk (05/24/2022)    Readmission Risk Interventions     No data to display

## 2022-06-05 NOTE — Assessment & Plan Note (Signed)
Continue to monitor.  Patient on end-of-life care.

## 2022-06-06 DIAGNOSIS — Z515 Encounter for palliative care: Secondary | ICD-10-CM | POA: Diagnosis not present

## 2022-06-06 DIAGNOSIS — R410 Disorientation, unspecified: Secondary | ICD-10-CM | POA: Diagnosis not present

## 2022-06-06 DIAGNOSIS — M25512 Pain in left shoulder: Secondary | ICD-10-CM | POA: Diagnosis not present

## 2022-06-06 DIAGNOSIS — A419 Sepsis, unspecified organism: Secondary | ICD-10-CM | POA: Diagnosis not present

## 2022-06-06 NOTE — Progress Notes (Signed)
The patient's son requested to have the IV removed. Rosey Bath RN removed IV and no dressing change was needed.

## 2022-06-06 NOTE — Progress Notes (Signed)
Progress Note   Patient: Curtis Wagner WUJ:[id number] DOB: May 23, 1940 DOA: 05/24/2022     13 DOS: the patient was seen and examined on 06/06/2022   Brief hospital course: HPI on admission 05/24/22 by Dr. Clyde Lundborg: "Curtis Wagner is a 82 y.o. 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   Per her daughter at the bedside, patient fell in the early morning at about 3 AM.  He is supposed to use [place], but did not use it. No loss of consciousness.  Patient has abrasion in forehead.  No unilateral numbness or tinglings in extremities.  No facial droop or slurred speech.  Patient does not have chest pain, cough, shortness of breath.  No nausea, vomiting or abdominal pain.  Patient has diarrhea in the past several days, several watery diarrhea each day.  No fever or chills.  No symptoms of UTI.  Patient reports right lower leg pain which has been going on for about 3 days, also has swelling and erythema in left lower leg.   Data reviewed independently and ED Course: pt was found to have WBC 7.2, BNP 2406, lactic acid 4.5, troponin level 22, 21, potassium 5.3, worsening renal function with creatinine 2.69, BUN 32 and GFR 23 (recent baseline creatinine 1.39 on 04/17/2022), abnormal liver function (ALP 89, AST 138, ALT 53, total bilirubin 1.8).  Temperature normal, soft blood pressure 86/61 which improved to 100/83 after giving 750 cc LR bolus in ED --> 98/70, heart rate 69 --> 103, RR 16-->23, oxygen saturation 98% on room air.  Chest x-ray negative.  Images are negative for acute injury including CT of head, CT of C-spine, x-ray of her bilateral ankles, x-ray of left forearm.  Right lower extremity venous Doppler negative for DVT but showed Baker's cyst.  Patient is admitted to telemetry bed as inpatient"  Patient made comfort care on 05/28/2022.  5/9.  Patient  complains of left shoulder pain.  Resistant with me moving around.  Will give a dose of Solu-Medrol and Tylenol and see if that improves his discomfort. 5/10.  Patient not eating, will get calorie count. 5/11.  Hospice reevaluation, still not a candidate for hospice home.  TOC looking into options.  Calorie count underway. 5/12.  TOC still looking into placement options. 5/13.  Patient telling a story that he was at a game last night.  Patient obviously was not at a game last night. 5/14.  Palliative care following.  Hospice signed off.  Assessment and Plan: * End of life care Comfort care initiated in the afternoon of 05/28/22.  Currently not a candidate for hospice home.  Calorie count shows that he is only eating 8% of his required nutritional needs.    Acute delirium Continue to monitor.  Patient on end-of-life care.  Left shoulder pain Given on dose of solumedrol on 5/9.  Tylenol as needed.  Severe sepsis (HCC) Due to RLE Cellulitis Strep pyogenes Bacteremia With tachycardia HR 103 and tachypnea RR 23.  Lactic acidosis, AKI, transaminitis consistent with organ dysfunction and severe sepsis.  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   Acute renal failure superimposed on stage 3a chronic kidney disease (HCC) Cr on admission 2.69.  Last creatinine 1.76   Cellulitis of right lower extremity See Severe Sepsis  Chronic systolic CHF (congestive heart failure) (HCC) Echo on 04/12/2022 showed EF<20%.  On comfort care measures.  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    Elevated lactic acid level Due to severe sepsis. Lactate normalized 1.3 (on 5/4)   Acute metabolic encephalopathy Due to  severe sepsis  Hyponatremia Last sodium 129 on 05/28/2022  History of pulmonary embolism Stopped Eliquis  PAF (paroxysmal atrial fibrillation) (HCC) Stopped Eliquis, amiodarone with comfort care measures  PAD (peripheral artery disease) (HCC) Stopped medication with comfort care measures  Hyperkalemia K was 5.3 on admission   Diarrhea Resolved.  Hyperlipidemia Off Lipitor  Hypertension Off medications  Malnutrition of moderate degree Calorie count  Hydronephrosis of left kidney Acute Urinary Retention CT abd/pelvis on admission 5/1 showed significant LEFT hydronephrosis and hydroureter down to bladder, no obstructing stone or mass seen. -Continue Foley catheter  Prolonged QT interval No further monitoring  Thrombocytopenia (HCC) Last platelet count 72.  Likely with severe sepsis.  CKD stage 3 due to type 2 diabetes mellitus (HCC) Chronic disease stage IIIa.  Off medications with comfort care measures        Subjective: Patient awakened from sleep.  He realized he was in the hospital.  He was sleeping deeply.  I saw him late morning.  Currently not a candidate for hospice home and on comfort care measures.  Initially admitted with sepsis.  Physical Exam: Vitals:   06/05/22 0836 06/05/22 1517 06/05/22 2320 06/06/22 0913  BP: 109/81 99/86 108/76 105/69  Pulse: 93 96 94 88  Resp: 17 15 16 18   Temp: (!) 97.5 F (36.4 C) 97.6 F (36.4 C) 97.7 F (36.5 C) (!) 97.5 F (36.4 C)  TempSrc:    Oral  SpO2: 100% 98% 97% 98%  Weight:      Height:       Physical Exam HENT:     Head: Normocephalic.     Mouth/Throat:     Pharynx: No oropharyngeal exudate.  Eyes:     General: Lids are normal.     Conjunctiva/sclera: Conjunctivae normal.  Cardiovascular:     Rate and Rhythm: Normal rate and regular rhythm.     Heart sounds: Normal heart sounds, S1 normal and S2 normal.  Pulmonary:     Breath sounds: No decreased breath sounds, wheezing, rhonchi or rales.   Abdominal:     Palpations: Abdomen is soft.     Tenderness: There is no abdominal tenderness.  Musculoskeletal:     Right lower leg: Swelling present.     Left lower leg: Swelling present.     Comments: Patient able to move his left arm on his own without yelling in pain.  Skin:    General: Skin is warm.     Findings: No rash.  Neurological:     Mental Status: He is alert.     Data Reviewed: No new data Family Communication: Spoke with patient's daughter and she has an appointment with one of the nursing home facilities on Thursday.  Disposition: Status is: Inpatient Remains inpatient appropriate because: Patient not a candidate for hospice home and unable to go back to his prior residence.  Planned Discharge Destination: Patient's daughter will have a meeting at one of the nursing home facilities on Thursday.    Time spent: 26 minutes  Author: Alford Highland, MD 06/06/2022 4:51 PM  For on call review  [email or web address]

## 2022-06-06 NOTE — Plan of Care (Signed)

## 2022-06-07 DIAGNOSIS — Z515 Encounter for palliative care: Secondary | ICD-10-CM | POA: Diagnosis not present

## 2022-06-07 DIAGNOSIS — E440 Moderate protein-calorie malnutrition: Secondary | ICD-10-CM | POA: Diagnosis not present

## 2022-06-07 DIAGNOSIS — W19XXXA Unspecified fall, initial encounter: Secondary | ICD-10-CM | POA: Diagnosis not present

## 2022-06-07 DIAGNOSIS — L03115 Cellulitis of right lower limb: Secondary | ICD-10-CM | POA: Diagnosis not present

## 2022-06-07 MED ORDER — ACETAMINOPHEN 650 MG RE SUPP: 650.0000 mg | Freq: Four times a day (QID) | RECTAL | Status: DC

## 2022-06-07 MED ORDER — ACETAMINOPHEN 325 MG PO TABS
650.0000 mg | ORAL_TABLET | Freq: Four times a day (QID) | ORAL | Status: DC
  Administered 2022-06-07 – 2022-06-08 (×3): 650 mg via ORAL
  Filled 2022-06-07 (×3): qty 2

## 2022-06-07 NOTE — Progress Notes (Signed)
[facility]- [employer] [facility], contracting [facility])  Hospice continues to follow patient peripherally pending discharge disposition.    Please don't hesitate to call with any Hospice related questions or concerns.    Thank you for the opportunity to participate in this patient's care.  [facility] Liaison [phone]

## 2022-06-07 NOTE — Plan of Care (Signed)

## 2022-06-07 NOTE — Progress Notes (Signed)
PROGRESS NOTE    Curtis Wagner  ZOX:[id number] DOB: July 08, 1940 DOA: 05/24/2022 PCP: Margaretann Loveless, MD    Brief Narrative:  82 year old gentleman with history of hypertension, hyperlipidemia, peripheral vascular disease, coronary artery disease, systolic heart failure with known ejection fraction less than 20%, CKD stage IIIa, A-fib and PE on Eliquis who presented to the hospital with fall, right lower leg pain.  In the emergency room his WBC was 7.2, BNP was 2406, lactic acid 4.5.  Renal functions were worsening with creatinine 2.69.  Patient was admitted to hospital and treated for severe sepsis with cellulitis, acute renal failure, urinary retention and metabolic encephalopathy. Patient without appropriate clinical improvement, he was served with comfort care starting 5/6. Remains in the hospital.  Fairly comfortable and is stable.  Waiting for long-term care.   Assessment & Plan:   Severe sepsis due to right lower extremity cellulitis, history of pyogenous bacteremia: Presented with heart rate 103, tachypnea, lactic acidosis and AKI along with altered mental status. Treated with penicillin IV, also received IVIG on 5/2. Converted to comfort care on 5/5 and antibiotics were stopped. Patient has been doing fairly well since then.  Acute renal failure: Last creatinine 1.76. Chronic systolic heart failure: Known ejection fraction less than 20%.  Currently euvolemic. DVT and PE: Eliquis discontinued. Paroxysmal A-fib: Stable.  On comfort care. Urinary retention with hydronephrosis: Continue Foley catheter.  Goal of care: Served with comfort care in the hospital.  Not eligible for inpatient hospice. Either home with home hospice or long-term care with hospice program appropriate for the patient.  Family working with case management.   DVT prophylaxis: Comfort care   Code Status: Comfort care Family Communication: None at bedside Disposition Plan: Status is: Inpatient Remains  inpatient appropriate because: Safe disposition pending.     Consultants:  Palliative ID  Procedures:  Multiple procedures as above  Antimicrobials:  None   Subjective: Patient seen in the morning rounds.  He tells me that he feels comfortable.  He got good night sleep.  Denies any pain or discomfort.  I asked about eating, he tells me he does not have any appetite and he knows what is going on. He is not needing any medications.  Last dose of Valium on 5/6, not used any other medications.  Objective: Vitals:   06/05/22 2320 06/06/22 0913 06/06/22 1704 06/06/22 2319  BP: 108/76 105/69 106/81 115/84  Pulse: 94 88 94 90  Resp: 16 18 20 16   Temp: 97.7 F (36.5 C) (!) 97.5 F (36.4 C) 97.9 F (36.6 C) 97.6 F (36.4 C)  TempSrc:  Oral    SpO2: 97% 98% 98%   Weight:      Height:        Intake/Output Summary (Last 24 hours) at 06/07/2022 1406 Last data filed at 06/07/2022 0552 Gross per 24 hour  Intake --  Output 950 ml  Net -950 ml   Filed Weights   05/24/22 1236 05/25/22 0500 05/26/22 0428  Weight: 82.6 kg 82.4 kg 84 kg    Examination:  Chronically sick looking, frail and debilitated.  Not in any distress.  On room air. Alert awake and mostly oriented. Both legs on dry dressing.    Data Reviewed: I have personally reviewed following labs and imaging studies  CBC: No results for input(s): "WBC", "NEUTROABS", "HGB", "HCT", "MCV", "PLT" in the last 168 hours. Basic Metabolic Panel: No results for input(s): "NA", "K", "CL", "CO2", "GLUCOSE", "BUN", "CREATININE", "CALCIUM", "MG", "PHOS" in the last  168 hours. GFR: Estimated Creatinine Clearance: 33.3 mL/min (A) (by C-G formula based on SCr of 1.76 mg/dL (H)). Liver Function Tests: No results for input(s): "AST", "ALT", "ALKPHOS", "BILITOT", "PROT", "ALBUMIN" in the last 168 hours. No results for input(s): "LIPASE", "AMYLASE" in the last 168 hours. No results for input(s): "AMMONIA" in the last 168  hours. Coagulation Profile: No results for input(s): "INR", "PROTIME" in the last 168 hours. Cardiac Enzymes: No results for input(s): "CKTOTAL", "CKMB", "CKMBINDEX", "TROPONINI" in the last 168 hours. BNP (last 3 results) No results for input(s): "PROBNP" in the last 8760 hours. HbA1C: No results for input(s): "HGBA1C" in the last 72 hours. CBG: No results for input(s): "GLUCAP" in the last 168 hours. Lipid Profile: No results for input(s): "CHOL", "HDL", "LDLCALC", "TRIG", "CHOLHDL", "LDLDIRECT" in the last 72 hours. Thyroid Function Tests: No results for input(s): "TSH", "T4TOTAL", "FREET4", "T3FREE", "THYROIDAB" in the last 72 hours. Anemia Panel: No results for input(s): "VITAMINB12", "FOLATE", "FERRITIN", "TIBC", "IRON", "RETICCTPCT" in the last 72 hours. Sepsis Labs: No results for input(s): "PROCALCITON", "LATICACIDVEN" in the last 168 hours.  No results found for this or any previous visit (from the past 240 hour(s)).       Radiology Studies: No results found.      Scheduled Meds:  acetaminophen  650 mg Oral Q6H   Or   acetaminophen  650 mg Rectal Q6H   Continuous Infusions:   LOS: 14 days    Time spent: 25 minutes    Dorcas Carrow, MD [facility] Pager [phone]

## 2022-06-07 NOTE — TOC Progression Note (Addendum)
Transition of Care [facility]) - Progression Note    Patient Details  Name: Curtis Wagner MRN: [id number] Date of Birth: September 08, 1940  Transition of Care [facility]) CM/SW Contact  Marlowe Sax, RN Phone Number: 06/07/2022, 3:30 PM  Clinical Narrative:     The daughter is supposed to tour [facility] for long term care tomorrow, They were considering home with 24/7 care but have gone back to looking at long term care Does not qualify for hospice facility would want outpatient hopsice   I called the daughter [name] and left a VM asking for a call back    Expected Discharge Plan and Services                                               Social Determinants of Health (SDOH) Interventions SDOH Screenings   Food Insecurity: No Food Insecurity (05/24/2022)  Housing: Low Risk  (05/24/2022)  Transportation Needs: No Transportation Needs (05/24/2022)  Utilities: Not At Risk (05/24/2022)  Depression (PHQ2-9): Medium Risk (05/04/2022)  Tobacco Use: Medium Risk (05/24/2022)    Readmission Risk Interventions     No data to display

## 2022-06-07 NOTE — Progress Notes (Signed)
                                                     Palliative Care Progress Note, Assessment & Plan   Patient Name: Curtis Wagner       Date: 06/07/2022 DOB: Jan 24, 1940  Age: 82 y.o. MRN#: [id number] Attending Physician: Dorcas Carrow, MD Primary Care Physician: Margaretann Loveless, MD Admit Date: 05/24/2022  Subjective: Patient is sitting up in bed, resting but is easily arousable.  He acknowledges my presence.  He is able to make his wishes known.  He is requesting his lunch tray be set up for him and that he be better position in the bed.  No family or friends present during my visit.  HPI: 82 y.o. male  with past medical history of HTN, HLD, PVD, CAD, right atrial thrombus, persistent A. Fib and PE on Eliquis, DVT, CKD stage 3a admitted from home on 05/24/2022 with fall with complaints of associated right lower leg pain that initially started 3 days prior.   Noted abrasion to forehead. No reports of loss of consciousness. Imaging negative for acute injury.   Being treated for severe sepsis secondary to RLE cellulitis, ARF superimposed on stage 3a CKD, hyponatremia, urinary retention, and metabolic encephalopathy.   Noted recent hospital admission in March 2024. Curtis Wagner familiar to our service as we were consulted and followed during his previous admission.   Palliative medicine was consulted for assisting with goals of care conversations.    5/5, family decided to proceed with comfort care and evaluated for appropriate hospice services.   Summary of counseling/coordination of care: After reviewing the patient's chart and assessing the patient at bedside, I assessed patient's symptoms.  Pain assessed.  Patient endorses pain in low back.  Patient suffers from chronic low back pain.  He endorses Tylenol helps with improving pain.  In review of chart,  patient has been receiving as needed Tylenol consistently.  As needed switched to scheduled.  Patient denies other complaints such as nausea, constipation, headache, or discomfort.  No further adjustments to medications needed at this time.  Patient alert and oriented but unable to participate in medical decision-making at this time.  Comfort measures continue.   Plan remains for patient to discharge with hospice services to follow.  TOC following closely for discharge planning.  PMT will continue to monitor and support patient throughout his hospitalization.  Physical Exam Vitals reviewed.  Constitutional:      General: He is not in acute distress.    Appearance: He is normal weight.  HENT:     Head: Normocephalic.     Mouth/Throat:     Mouth: Mucous membranes are moist.  Eyes:     Pupils: Pupils are equal, round, and reactive to light.  Pulmonary:     Effort: Pulmonary effort is normal.  Abdominal:     Palpations: Abdomen is soft.  Musculoskeletal:     Comments: Generalized weakness  Neurological:     Mental Status: He is alert.     Comments: Oriented to self  Psychiatric:        Behavior: Behavior normal.             Total Time 25 minutes   Curtis Remmers L. Manon Hilding, FNP-BC Palliative Medicine Team Team Phone # [phone]

## 2022-06-08 DIAGNOSIS — W19XXXA Unspecified fall, initial encounter: Secondary | ICD-10-CM | POA: Diagnosis not present

## 2022-06-08 DIAGNOSIS — L03115 Cellulitis of right lower limb: Secondary | ICD-10-CM | POA: Diagnosis not present

## 2022-06-08 DIAGNOSIS — Y92009 Unspecified place in unspecified non-institutional (private) residence as the place of occurrence of the external cause: Secondary | ICD-10-CM | POA: Diagnosis not present

## 2022-06-08 DIAGNOSIS — Z515 Encounter for palliative care: Secondary | ICD-10-CM | POA: Diagnosis not present

## 2022-06-08 MED ORDER — ACETAMINOPHEN 650 MG RE SUPP
650.0000 mg | Freq: Three times a day (TID) | RECTAL | Status: DC
  Filled 2022-06-08 (×2): qty 1

## 2022-06-08 MED ORDER — ZINC OXIDE 40 % EX OINT
TOPICAL_OINTMENT | CUTANEOUS | Status: DC
  Filled 2022-06-08: qty 113

## 2022-06-08 MED ORDER — ACETAMINOPHEN 325 MG PO TABS
650.0000 mg | ORAL_TABLET | Freq: Three times a day (TID) | ORAL | Status: DC
  Administered 2022-06-08 – 2022-06-09 (×3): 650 mg via ORAL
  Filled 2022-06-08 (×3): qty 2

## 2022-06-08 NOTE — Progress Notes (Signed)
                                                     Palliative Care Progress Note, Assessment & Plan   Patient Name: Curtis Wagner       Date: 06/08/2022 DOB: September 26, 1940  Age: 82 y.o. MRN#: [id number] Attending Physician: Dorcas Carrow, MD Primary Care Physician: Margaretann Loveless, MD Admit Date: 05/24/2022  Subjective: Patient is lying in bed in no apparent distress. He is able to make his wishes known.  He endorses mild and generalized pain.  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  He remains pleasantly confused but cooperatibe. No family or friends present at bedside during my visit.  HPI: 82 y.o. male  with past medical history of HTN, HLD, PVD, CAD, right atrial thrombus, persistent A. Fib and PE on Eliquis, DVT, CKD stage 3a admitted from home on 05/24/2022 with fall with complaints of associated right lower leg pain that initially started 3 days prior.   Noted abrasion to forehead. No reports of loss of consciousness. Imaging negative for acute injury.   Being treated for severe sepsis secondary to RLE cellulitis, ARF superimposed on stage 3a CKD, hyponatremia, urinary retention, and metabolic encephalopathy.   Noted recent hospital admission in March 2024. Mr. Salmela familiar to our service as we were consulted and followed during his previous admission.   Palliative medicine was consulted for assisting with goals of care conversations.    5/5, family decided to proceed with comfort care and evaluated for appropriate hospice services.   Summary of counseling/coordination of care: After reviewing the patient's chart and assessing the patient at bedside, I assessed the patient's symptoms. Comfort care remains.   Pt is unable to decipher if scheduled tylenol has aided in pain  control. He endorses generalized pain and cannot localize it. He endorses the "surgery" he just had is still causing him pain. He also says he just finished playing football.   No non-verbal signs of pain/distress noted such as brow furrowing, grimacing, wincing, or moaning.  DIscharge planning still in progress and TOC following closely.   DNR and comfort measures remain. PMT will continue to follow.   Physical Exam Vitals reviewed.  Constitutional:      General: He is not in acute distress.    Appearance: He is normal weight.  HENT:     Head: Normocephalic.     Mouth/Throat:     Mouth: Mucous membranes are moist.  Eyes:     Pupils: Pupils are equal, round, and reactive to light.  Abdominal:     Palpations: Abdomen is soft.  Musculoskeletal:     Comments: Generalized weakness  Skin:    General: Skin is warm and dry.     Findings: Bruising present.  Neurological:     Mental Status: He is alert.     Comments: Oriented to self  Psychiatric:        Mood and Affect: Mood normal.             Total Time 25 minutes   Lasean Gorniak L. Manon Hilding, FNP-BC Palliative Medicine Team Team Phone # [phone]

## 2022-06-08 NOTE — Progress Notes (Signed)
[facility]- Civil engineer, contracting [facility]) Patient observed sitting up in bed awake and alert.  No family at bedside.   Hospice continues to follow the patient peripherally.   Please don't hesitate to call with any Hospice related questions or concerns.    Thank you for the opportunity to participate in this patient's care.  [facility] Liaison [phone]

## 2022-06-08 NOTE — TOC Progression Note (Signed)
Transition of Care [facility]) - Progression Note    Patient Details  Name: Curtis Wagner MRN: [id number] Date of Birth: 1940/08/30  Transition of Care [facility]) CM/SW Contact  Marlowe Sax, RN Phone Number: 06/08/2022, 4:52 PM  Clinical Narrative:   Received a call from Daughter [name], She stated that she toured the facility [facility] and they are going to DC to there, They can take him tomorrow          Expected Discharge Plan and Services                                               Social Determinants of Health (SDOH) Interventions SDOH Screenings   Food Insecurity: No Food Insecurity (05/24/2022)  Housing: Low Risk  (05/24/2022)  Transportation Needs: No Transportation Needs (05/24/2022)  Utilities: Not At Risk (05/24/2022)  Depression (PHQ2-9): Medium Risk (05/04/2022)  Tobacco Use: Medium Risk (05/24/2022)    Readmission Risk Interventions     No data to display

## 2022-06-08 NOTE — Plan of Care (Signed)
  Problem: Health Behavior/Discharge Planning: Goal: Ability to manage health-related needs will improve Outcome: Progressing   Problem: Clinical Measurements: Goal: Will remain free from infection Outcome: Progressing   Problem: Clinical Measurements: Goal: Respiratory complications will improve Outcome: Progressing   Problem: Clinical Measurements: Goal: Cardiovascular complication will be avoided Outcome: Progressing   Problem: Coping: Goal: Level of anxiety will decrease Outcome: Progressing   Problem: Elimination: Goal: Will not experience complications related to bowel motility Outcome: Progressing

## 2022-06-08 NOTE — Consult Note (Signed)
WOC Nurse Consult Note: Reason for Consult:Bedside RN reports bleeding from IAD affected areas on the buttocks and posterior thighs Wound type:irritant contact dermatitis  ICD-10 CM Codes for Irritant Dermatitis  L24A2 - Due to fecal, urinary or dual incontinence L24A9 - Due to friction or contact with other specified body fluids L30.4  - Erythema intertrigo. Also used for abrasion of the hand, chafing of the skin, dermatitis due to sweating and friction, friction dermatitis, friction eczema, and genital/thigh intertrigo.   Pressure Injury POA: N/A Wound WJX:BJYN, moist Drainage (amount, consistency, odor) serosanguinous Periwound: mild maceration Dressing procedure/placement/frequency:Turning and repositioning is in place, heels are floated. Moisture barrier cream is in use, but a thicker barrier cream is requested. I will provide Desitin ointment for every 4 hours and PRN inadvertent removal with repositioning to begin as soon as it is available from the pharmacy. The assistance of the Bedside RN is appreciated.  WOC nursing team will not follow, but will remain available to this patient, the nursing and medical teams.  Please re-consult if needed.  Thank you for inviting [place] to participate in this patient's Plan of Care.  Ladona Mow, MSN, RN, CNS, GNP, Leda Min, [employer], [employer] phone:  [phone]

## 2022-06-08 NOTE — [place] FL2 (Signed)
[place] MEDICAID FL2 LEVEL OF CARE FORM     IDENTIFICATION  Patient Name: Curtis Wagner Birthdate: 1940-03-17 Sex: male Admission Date (Current Location): 05/24/2022  [facility] and [place] Number:  Chiropodist and Address:  [facility], [address] [place], [place] [address]      Provider Number: [id number]  Attending Physician Name and Address:  Dorcas Carrow, MD  Relative Name and Phone Number:  Derrek Monaco [phone]    Current Level of Care: Hospital Recommended Level of Care: Nursing Facility Prior Approval Number:    Date Approved/Denied:   PASRR Number: [id number]  Discharge Plan: Other (Comment) (long term care)    Current Diagnoses: Patient Active Problem List   Diagnosis Date Noted   Acute delirium 06/05/2022   Malnutrition of moderate degree 06/02/2022   Left shoulder pain 06/01/2022   Sepsis due to Streptococcus pyogenes (HCC) 05/31/2022   End of life care 05/29/2022   Prolonged QT interval 05/26/2022   Hydronephrosis of left kidney 05/26/2022   Acute metabolic encephalopathy 05/25/2022   Cellulitis of right lower extremity 05/24/2022   Gout 05/24/2022   Acute renal failure superimposed on stage 3a chronic kidney disease (HCC) 05/24/2022   Hyperkalemia 05/24/2022   Elevated lactic acid level 05/24/2022   Abnormal LFTs 05/24/2022   Myocardial injury 05/24/2022   Diarrhea 05/24/2022   Severe sepsis (HCC) 05/24/2022   NSVT (nonsustained ventricular tachycardia) (HCC) 04/16/2022   Chronic kidney disease, stage 3a (HCC) 04/16/2022   Hyponatremia 04/16/2022   Thrombocytopenia (HCC) 04/16/2022   Hypokalemia 04/16/2022   Chronic systolic CHF (congestive heart failure) (HCC) 04/16/2022   Moderate pulmonary hypertension (HCC) 04/16/2022   History of pulmonary embolism 04/16/2022   Protein-calorie malnutrition, moderate (HCC) 04/16/2022   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 with rest pain (HCC)  04/15/2022   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) 04/15/2022   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   Left leg pain 04/13/2022   Orthostatic dizziness 04/13/2022   Coronary artery disease of native artery of native heart with stable angina pectoris (HCC) 04/13/2022   Orthostatic hypotension 04/12/2022   Myalgia 04/12/2022   Absent pedal pulses 04/12/2022   PAD (peripheral artery disease) (HCC) 04/12/2022   Acute lower limb ischemia 04/12/2022   CKD stage 3 due to type 2 diabetes mellitus (HCC) 04/01/2021   Bilateral pulmonary embolism (HCC) 03/30/2021   PAF (paroxysmal atrial fibrillation) (HCC)    Hypertension    Hyperlipidemia    Arthritis of shoulder region, right 01/01/2015    Orientation RESPIRATION BLADDER Height & Weight     Self, Place  Normal Indwelling catheter Weight: 84 kg Height:  5\' 11"  (180.3 cm)  BEHAVIORAL SYMPTOMS/MOOD NEUROLOGICAL BOWEL NUTRITION STATUS      Continent Diet (see DC summary)  AMBULATORY STATUS COMMUNICATION OF NEEDS Skin   Extensive Assist Verbally Normal, Skin abrasions (forehead abrasion)                       Personal Care Assistance Level of Assistance  Bathing, Feeding, Dressing, Total care Bathing Assistance: Limited assistance Feeding assistance: Limited assistance Dressing Assistance: Maximum assistance Total Care Assistance: Limited assistance   Functional Limitations Info  Sight, Hearing, Speech Sight Info: Adequate Hearing Info: Adequate Speech Info: Adequate    SPECIAL CARE FACTORS FREQUENCY                       Contractures Contractures Info: Not present  Additional Factors Info  Code Status, Allergies Code Status Info: DNR COmfort care Allergies Info: morphine, codeine           Current Medications (06/08/2022):  This is the current hospital active medication list Current Facility-Administered Medications  Medication Dose Route Frequency Provider Last Rate Last Admin    acetaminophen (TYLENOL) tablet 650 mg  650 mg Oral TID Georgiann Cocker, FNP   650 mg at 06/08/22 1539   Or   acetaminophen (TYLENOL) suppository 650 mg  650 mg Rectal TID Georgiann Cocker, FNP       albuterol (PROVENTIL) (2.5 MG/3ML) 0.083% nebulizer solution 3 mL  3 mL Inhalation Q4H PRN Lorretta Harp, MD       antiseptic oral rinse (BIOTENE) solution 15 mL  15 mL Topical PRN Theotis Burrow, NP       diazepam (VALIUM) injection 2.5 mg  2.5 mg Intravenous Q8H PRN Esaw Grandchild A, DO       diazepam (VALIUM) tablet 2.5 mg  2.5 mg Oral Q6H PRN Esaw Grandchild A, DO   2.5 mg at 05/29/22 2242   diphenhydrAMINE (BENADRYL) injection 12.5 mg  12.5 mg Intravenous Q8H PRN Zeigler, Burtis Junes, RPH       glycopyrrolate (ROBINUL) tablet 1 mg  1 mg Oral Q4H PRN Theotis Burrow, NP       Or   glycopyrrolate (ROBINUL) injection 0.2 mg  0.2 mg Subcutaneous Q4H PRN Theotis Burrow, NP       Or   glycopyrrolate (ROBINUL) injection 0.2 mg  0.2 mg Intravenous Q4H PRN Theotis Burrow, NP       morphine CONCENTRATE 10 MG/0.5ML oral solution 5 mg  5 mg Oral Q2H PRN Theotis Burrow, NP       Or   morphine CONCENTRATE 10 MG/0.5ML oral solution 5 mg  5 mg Sublingual Q2H PRN Theotis Burrow, NP       Oral care mouth rinse  15 mL Mouth Rinse PRN Wieting, Richard, MD       polyvinyl alcohol (LIQUIFILM TEARS) 1.4 % ophthalmic solution 1 drop  1 drop Both Eyes QID PRN Theotis Burrow, NP         Discharge Medications: Please see discharge summary for a list of discharge medications.  Relevant Imaging Results:  Relevant Lab Results:   Additional Information SS# [id number]  Marlowe Sax, RN

## 2022-06-08 NOTE — TOC Progression Note (Signed)
Transition of Care [facility]) - Progression Note    Patient Details  Name: Curtis Wagner MRN: [id number] Date of Birth: 11-18-1940  [facility]) CM/SW Contact  Marlowe Sax, RN Phone Number: 06/08/2022, 9:48 AM  Clinical Narrative:   Left another VM for the daughter asking for a call back          Expected Discharge Plan and Services                                               Social Determinants of Health (SDOH) Interventions SDOH Screenings   Food Insecurity: No Food Insecurity (05/24/2022)  Housing: Low Risk  (05/24/2022)  Transportation Needs: No Transportation Needs (05/24/2022)  Utilities: Not At Risk (05/24/2022)  Depression (PHQ2-9): Medium Risk (05/04/2022)  Tobacco Use: Medium Risk (05/24/2022)    Readmission Risk Interventions     No data to display

## 2022-06-08 NOTE — Progress Notes (Signed)
PROGRESS NOTE    Curtis Wagner  ZOX:[id number] DOB: 1940/05/02 DOA: 05/24/2022 PCP: Margaretann Loveless, MD    Brief Narrative:  82 year old gentleman with history of hypertension, hyperlipidemia, peripheral vascular disease, coronary artery disease, systolic heart failure with known ejection fraction less than 20%, CKD stage IIIa, A-fib and PE on Eliquis who presented to the hospital with fall, right lower leg pain.  In the emergency room his WBC was 7.2, BNP was 2406, lactic acid 4.5.  Renal functions were worsening with creatinine 2.69.  Patient was admitted to hospital and treated for severe sepsis with cellulitis, acute renal failure, urinary retention and metabolic encephalopathy. Patient without appropriate clinical improvement, he was served with comfort care starting 5/6. Remains in the hospital.  Fairly comfortable and is stable.  Waiting for long-term care.   Assessment & Plan:   Severe sepsis due to right lower extremity cellulitis, history of pyogenous bacteremia: Presented with heart rate 103, tachypnea, lactic acidosis and AKI along with altered mental status. Treated with penicillin IV, also received IVIG on 5/2. Converted to comfort care on 5/5 and antibiotics were stopped. Patient has been doing fairly well since then.  Acute renal failure: Last creatinine 1.76. Chronic systolic heart failure: Known ejection fraction less than 20%.  Currently euvolemic. DVT and PE: Eliquis discontinued. Paroxysmal A-fib: Stable.  On comfort care. Urinary retention with hydronephrosis: Continue Foley catheter.  Goal of care: Served with comfort care in the hospital.  He is not at end-of-life. Either home with home hospice or long-term care with hospice program appropriate for the patient.  Family working with case management.   DVT prophylaxis: Comfort care   Code Status: Comfort care Family Communication: None at bedside Disposition Plan: Status is: Inpatient Remains inpatient  appropriate because: Safe disposition pending.     Consultants:  Palliative ID  Procedures:  Multiple procedures as above  Antimicrobials:  None   Subjective:  Seen and examined.  No new events.  Looks comfortable.  Objective: Vitals:   06/06/22 2319 06/07/22 1425 06/07/22 1604 06/08/22 0005  BP: 115/84 114/74 102/77 95/73  Pulse: 90 90 (!) 102 87  Resp: 16 20 18 20   Temp: 97.6 F (36.4 C) 98.6 F (37 C) (!) 97.5 F (36.4 C) 98.9 F (37.2 C)  TempSrc:      SpO2:  100% 95% 100%  Weight:      Height:        Intake/Output Summary (Last 24 hours) at 06/08/2022 1353 Last data filed at 06/08/2022 1008 Gross per 24 hour  Intake 220 ml  Output 925 ml  Net -705 ml    Filed Weights   05/24/22 1236 05/25/22 0500 05/26/22 0428  Weight: 82.6 kg 82.4 kg 84 kg    Examination:  Chronically sick looking, frail and debilitated.  Not in any distress.  On room air. Alert awake and mostly oriented. Both legs on dry dressing.    Data Reviewed: I have personally reviewed following labs and imaging studies  CBC: No results for input(s): "WBC", "NEUTROABS", "HGB", "HCT", "MCV", "PLT" in the last 168 hours. Basic Metabolic Panel: No results for input(s): "NA", "K", "CL", "CO2", "GLUCOSE", "BUN", "CREATININE", "CALCIUM", "MG", "PHOS" in the last 168 hours. GFR: Estimated Creatinine Clearance: 33.3 mL/min (A) (by C-G formula based on SCr of 1.76 mg/dL (H)). Liver Function Tests: No results for input(s): "AST", "ALT", "ALKPHOS", "BILITOT", "PROT", "ALBUMIN" in the last 168 hours. No results for input(s): "LIPASE", "AMYLASE" in the last 168 hours. No results for  input(s): "AMMONIA" in the last 168 hours. Coagulation Profile: No results for input(s): "INR", "PROTIME" in the last 168 hours. Cardiac Enzymes: No results for input(s): "CKTOTAL", "CKMB", "CKMBINDEX", "TROPONINI" in the last 168 hours. BNP (last 3 results) No results for input(s): "PROBNP" in the last 8760  hours. HbA1C: No results for input(s): "HGBA1C" in the last 72 hours. CBG: No results for input(s): "GLUCAP" in the last 168 hours. Lipid Profile: No results for input(s): "CHOL", "HDL", "LDLCALC", "TRIG", "CHOLHDL", "LDLDIRECT" in the last 72 hours. Thyroid Function Tests: No results for input(s): "TSH", "T4TOTAL", "FREET4", "T3FREE", "THYROIDAB" in the last 72 hours. Anemia Panel: No results for input(s): "VITAMINB12", "FOLATE", "FERRITIN", "TIBC", "IRON", "RETICCTPCT" in the last 72 hours. Sepsis Labs: No results for input(s): "PROCALCITON", "LATICACIDVEN" in the last 168 hours.  No results found for this or any previous visit (from the past 240 hour(s)).       Radiology Studies: No results found.      Scheduled Meds:  acetaminophen  650 mg Oral TID   Or   acetaminophen  650 mg Rectal TID   Continuous Infusions:   LOS: 15 days    Time spent: 25 minutes    Dorcas Carrow, MD [facility] Pager [phone]

## 2022-06-09 DIAGNOSIS — Z515 Encounter for palliative care: Secondary | ICD-10-CM | POA: Diagnosis not present

## 2022-06-09 MED ORDER — POLYVINYL ALCOHOL 1.4 % OP SOLN: 1.0000 [drp] | Freq: Four times a day (QID) | OPHTHALMIC | 0 refills | Status: DC | PRN

## 2022-06-09 MED ORDER — DIAZEPAM 5 MG PO TABS: 2.5000 mg | ORAL_TABLET | Freq: Four times a day (QID) | ORAL | 0 refills | Status: DC | PRN

## 2022-06-09 MED ORDER — MORPHINE SULFATE (CONCENTRATE) 10 MG/0.5ML PO SOLN: 5.0000 mg | ORAL | 0 refills | Status: AC | PRN

## 2022-06-09 MED ORDER — ALBUTEROL SULFATE (2.5 MG/3ML) 0.083% IN NEBU: 3.0000 mL | INHALATION_SOLUTION | RESPIRATORY_TRACT | 12 refills | Status: DC | PRN

## 2022-06-09 NOTE — Care Management Important Message (Signed)
Important Message  Patient Details  Name: Nkrumah Hackert MRN: [id number] Date of Birth: Apr 17, 1940   Medicare Important Message Given:  Yes  I reviewed the patient's Important Message from Medicare with POA, Royal Hawthorn, daughter by phone [phone] and she is in agreement with the discharge. I wished him a speedy recovery and her a good day.   Olegario Messier A Russell Quinney 06/09/2022, 9:51 AM

## 2022-06-09 NOTE — Progress Notes (Signed)
[facility]- Civil engineer, contracting [facility])  Patient discharging to [facility] today. Hospice will follow patient at the facility.     Please don't hesitate to call with any Hospice related questions or concerns.    Thank you for the opportunity to participate in this patient's care.  [facility] Liaison [phone]

## 2022-06-09 NOTE — TOC Progression Note (Signed)
Transition of Care [facility]) - Progression Note    Patient Details  Name: Curtis Wagner MRN: [id number] Date of Birth: August 14, 1940  Transition of Care [facility]) CM/SW Contact  Marlowe Sax, RN Phone Number: 06/09/2022, 12:26 PM  Clinical Narrative:    Patient to be discharged to [facility] room E13 Bedside nurse to call the daughter Donavan Burnet called, there is 1 ahead of him        Expected Discharge Plan and Services         Expected Discharge Date: 06/09/22                                     Social Determinants of Health (SDOH) Interventions SDOH Screenings   Food Insecurity: No Food Insecurity (05/24/2022)  Housing: Low Risk  (05/24/2022)  Transportation Needs: No Transportation Needs (05/24/2022)  Utilities: Not At Risk (05/24/2022)  Depression (PHQ2-9): Medium Risk (05/04/2022)  Tobacco Use: Medium Risk (05/24/2022)    Readmission Risk Interventions     No data to display

## 2022-06-09 NOTE — Discharge Summary (Signed)
Physician Discharge Summary  Curtis Wagner WUJ:[id number] DOB: May 20, 1940 DOA: 05/24/2022  PCP: Margaretann Loveless, MD  Admit date: 05/24/2022 Discharge date: 06/09/2022  Admitted From: Home Disposition: Long-term care nursing home with hospice  Recommendations for Outpatient Follow-up:  As per hospice provider Consult hospice on admission   Discharge Condition: Fair CODE STATUS: DNR Diet recommendation: Regular diet, nutritional supplements  Discharge summary: 82 year old gentleman with history of hypertension, hyperlipidemia, peripheral vascular disease, coronary artery disease, systolic heart failure with known ejection fraction less than 20%, CKD stage IIIa, A-fib and PE on Eliquis who presented to the hospital with fall, right lower leg pain.  In the emergency room his WBC was 7.2, BNP was 2406, lactic acid 4.5.  Renal functions were worsening with creatinine 2.69.  Patient was admitted to hospital and treated for severe sepsis with cellulitis, acute renal failure, urinary retention and metabolic encephalopathy. Patient without appropriate clinical improvement, he was served with comfort care starting 5/6. Remains in the hospital.  Fairly comfortable and is stable.  Waiting for long-term care.     Assessment & Plan:   Severe sepsis due to right lower extremity cellulitis, history of strep pyogenous bacteremia: Presented with heart rate 103, tachypnea, lactic acidosis and AKI along with altered mental status. Treated with penicillin IV, also received IVIG on 5/2. Converted to comfort care on 5/5 and antibiotics were stopped. Patient has been doing fairly well since then. Desired no further active treatment.  He was served with comfort care in the hospital and remains fairly stable.  Symptoms managed with minimal dose of oxycodone and Valium.  Acute renal failure: Last creatinine 1.76. Chronic systolic heart failure: Known ejection fraction less than 20%.  Currently euvolemic. DVT and  PE: Eliquis discontinued. Paroxysmal A-fib: Stable.  On comfort care. Urinary retention with hydronephrosis: Continue Foley catheter.   Plan of care: Served with comfort care in the hospital.  He is not at end-of-life however desired not to seek further active treatments. Only symptom control medications to continue. Stable to transfer to long-term care with hospice support.   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)   Abnormal LFTs   Elevated lactic acid level   Myocardial injury   Hyponatremia   Acute metabolic encephalopathy   PAF (paroxysmal atrial fibrillation) (HCC)   History of pulmonary embolism   PAD (peripheral artery disease) (HCC)   Hyperkalemia   Hypertension   Hyperlipidemia   Diarrhea   CKD stage 3 due to type 2 diabetes mellitus (HCC)   Thrombocytopenia (HCC)   Prolonged QT interval   Hydronephrosis of left kidney   Sepsis due to Streptococcus pyogenes (HCC)   Malnutrition of moderate degree    Discharge Instructions  Discharge Instructions     Diet general   Complete by: As directed    Discharge instructions   Complete by: As directed    Consult hospice at the facility.  Discharge with foley catheter , change every 4 weeks, due in 2 weeks    Discharge wound care:   Complete by: As directed    Protect pressure points   Increase activity slowly   Complete by: As directed  Allergies as of 06/09/2022       Reactions   Morphine Other (See Comments)   Morphine And Codeine Other (See Comments)   HALLUCINATIONS        Medication List     STOP taking these medications    allopurinol 300 MG  tablet Commonly known as: ZYLOPRIM   amiodarone 200 MG tablet Commonly known as: PACERONE   apixaban 5 MG Tabs tablet Commonly known as: ELIQUIS   atorvastatin 80 MG tablet Commonly known as: LIPITOR   Centrum Silver Adult 50+ Tabs   clopidogrel 75 MG tablet Commonly known as: PLAVIX   escitalopram 20 MG tablet Commonly known as: LEXAPRO   Jardiance 10 MG Tabs tablet Generic drug: empagliflozin   metoprolol succinate 25 MG 24 hr tablet Commonly known as: TOPROL-XL   nitroGLYCERIN 0.4 MG SL tablet Commonly known as: NITROSTAT   potassium chloride SA 20 MEQ tablet Commonly known as: KLOR-CON M   spironolactone 25 MG tablet Commonly known as: ALDACTONE   tamsulosin 0.4 MG Caps capsule Commonly known as: FLOMAX       TAKE these medications    albuterol (2.5 MG/3ML) 0.083% nebulizer solution Commonly known as: PROVENTIL Inhale 3 mLs into the lungs every 4 (four) hours as needed for wheezing or shortness of breath.   diazepam 5 MG tablet Commonly known as: VALIUM Take 0.5 tablets (2.5 mg total) by mouth every 6 (six) hours as needed for anxiety (sgitation).   morphine CONCENTRATE 10 MG/0.5ML Soln concentrated solution Take 0.25 mLs (5 mg total) by mouth every 2 (two) hours as needed for up to 5 days for moderate pain, shortness of breath, severe pain or anxiety (or dyspnea).   polyvinyl alcohol 1.4 % ophthalmic solution Commonly known as: LIQUIFILM TEARS Place 1 drop into both eyes 4 (four) times daily as needed for dry eyes.               Discharge Care Instructions  (From admission, onward)           Start     Ordered   06/09/22 0000  Discharge wound care:       Comments: Protect pressure points   06/09/22 1610            Allergies  Allergen Reactions   Morphine Other (See Comments)   Morphine And Codeine Other (See Comments)    HALLUCINATIONS    Consultations: Multiple   Procedures/Studies: CT ABDOMEN PELVIS WO  CONTRAST  Result Date: 05/24/2022 CLINICAL DATA:  Sepsis EXAM: CT ABDOMEN AND PELVIS WITHOUT CONTRAST TECHNIQUE: Multidetector CT imaging of the abdomen and pelvis was performed following the standard protocol without IV contrast. RADIATION DOSE REDUCTION: This exam was performed according to the departmental dose-optimization program which includes automated exposure control, adjustment of the mA and/or kV according to patient size and/or use of iterative reconstruction technique. COMPARISON:  None Available. FINDINGS: Lower chest: Moderate-sized bilateral pleural effusions with overlying atelectasis. The heart is mildly enlarged. No pericardial effusion. The distal esophagus is grossly normal. Aortic and coronary artery calcifications are noted. Hepatobiliary: No hepatic lesions are identified without contrast. No intrahepatic biliary dilatation. The gallbladder contains several calcified gallstones but no definite CT findings for acute cholecystitis. No common bile duct dilatation. Pancreas: Moderate age related atrophy but no mass, inflammation or ductal dilatation. Spleen: Normal sized spleen. No splenic lesions. Small amount of perisplenic fluid noted. Adrenals/Urinary Tract: Bilateral low-attenuation adrenal gland nodules consistent with benign adenomas. No further imaging evaluation follow-up is necessary.  Simple bilateral renal cysts not requiring any further imaging evaluation or follow-up. 12 mm left renal calculus with overlying significant renal cortical thinning/scarring. This could be in a E calyceal diverticulum. Significant left-sided hydronephrosis and hydroureter all the way down to the bladder without evidence of an obstructing calculus or mass. The right ureter is normal in caliber. There is a small calculus noted in the right-side bladder no bladder mass asymmetric bladder thickening. Stomach/Bowel: The stomach, duodenum, small and colon are grossly normal. The terminal ileum and appendix are  normal. Sigmoid colon diverticulosis without findings for acute diverticulitis. Vascular/Lymphatic: Advanced atherosclerotic calcification involving the aorta and branch vessels but no aneurysm. No mesenteric or retroperitoneal mass adenopathy. Small scattered lymph nodes are noted. Reproductive: Enlarged prostate gland with mild impression on the base of the bladder. The seminal vesicles are unremarkable. Other: Small amount of free fluid around the liver and spleen, in the pericolic gutters and in the pelvis. Diffuse subcutaneous edema suggesting anasarca. Musculoskeletal: No significant bony findings. IMPRESSION: 1. Significant left-sided hydronephrosis and hydroureter all the way down to the bladder without evidence of an obstructing calculus or mass. 2. 12 mm left renal calculus with overlying significant renal cortical thinning/scarring. This could be in a calyceal diverticulum. 3. Small calculus in the right-side of the bladder. 4. Moderate-sized bilateral pleural effusions with overlying atelectasis. 5. Small amount of abdominal/pelvic ascites. 6. Cholelithiasis. 7. 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 8. Advanced atherosclerotic calcification involving the aorta and branch vessels. Aortic Atherosclerosis (ICD10-I70.0). Electronically Signed   By: Rudie Meyer M.D.   On: 05/24/2022 15:16   US Venous Img Lower Unilateral Right  Result Date: 05/24/2022 CLINICAL DATA:  Right lower extremity swelling EXAM: RIGHT LOWER EXTREMITY VENOUS DOPPLER ULTRASOUND TECHNIQUE: Gray-scale sonography with compression, as well as color and duplex ultrasound, were performed to evaluate the deep venous system(s) from the level of the common femoral vein through the popliteal and proximal calf veins. COMPARISON:  None Available. FINDINGS: VENOUS Normal compressibility of the common femoral, superficial femoral, and popliteal veins, as well as the visualized calf veins. Visualized portions of profunda femoral vein  and great saphenous vein unremarkable. No filling defects to suggest DVT on grayscale or color Doppler imaging. Doppler waveforms show normal direction of venous flow, normal respiratory plasticity and response to augmentation. Limited views of the contralateral common femoral vein are unremarkable. OTHER None. Limitations: Complex Baker's cyst 5.9 by 2.4 by 2.2 cm in the popliteal fossa. IMPRESSION: 1. No right lower extremity DVT. 2. Complex Baker's cyst. Electronically Signed   By: Gaylyn Rong M.D.   On: 05/24/2022 14:36   DG Forearm Left  Result Date: 05/24/2022 CLINICAL DATA:  Trauma, fall EXAM: LEFT FOREARM - 2 VIEW COMPARISON:  None Available. FINDINGS: No recent fracture or dislocation is seen. There is no displacement of posterior fat pad in the elbow. There are smooth marginated calcifications at the attachment of triceps to the olecranon process. IMPRESSION: No recent fracture or dislocation is seen in the left forearm. Smoothly marginated calcifications at the attachment of triceps tendon to the olecranon process may suggest calcific tendinosis. Electronically Signed   By: Ernie Avena M.D.   On: 05/24/2022 14:03   DG Ankle Complete Right  Result Date: 05/24/2022 CLINICAL DATA:  Trauma, fall, pain EXAM: RIGHT ANKLE - COMPLETE 3+ VIEW COMPARISON:  None FINDINGS: No recent fracture or dislocation is seen. Small smooth myelinated calcifications are noted adjacent to the tips of medial and lateral malleoli, possibly residual from  previous injury. There is calcification in the interosseous membrane between the distal shafts of tibia and fibula and possible fusion of inferior radioulnar joint. These findings most likely are related to previous trauma. Plantar spur is seen in calcaneus. Calcifications are seen at the attachment of Achilles tendon to the calcaneus suggesting calcific tendinosis. Bony spurs are noted in the dorsal aspect of intertarsal joints. Arterial calcifications are seen  in soft tissues. There is soft tissue swelling around the ankle. IMPRESSION: No recent fracture or dislocation is seen in right ankle. Other findings as described in the body of the report. Electronically Signed   By: Ernie Avena M.D.   On: 05/24/2022 14:00   DG Ankle Complete Left  Result Date: 05/24/2022 CLINICAL DATA:  Trauma, fall EXAM: LEFT ANKLE COMPLETE - 3+ VIEW COMPARISON:  04/11/2022 FINDINGS: No recent fracture or dislocation is seen. Bony spurs are noted at the tips of medial and lateral malleoli. There are small calcific densities adjacent to the medial and lateral malleoli with no significant interval change, possibly residual from previous injury. There is soft tissue swelling around the ankle. Bony spurs are noted in the dorsal aspect of intertarsal joints, more so in the talonavicular joint. Plantar spur is seen in calcaneus. Small linear calcification in Achilles tendon close to the calcaneus may suggest calcific tendinosis with no interval change. Arterial calcifications are seen in soft tissues. Overall, no significant interval changes are noted. IMPRESSION: No recent fracture or dislocation is seen. Other findings as described in the body of the report. Electronically Signed   By: Ernie Avena M.D.   On: 05/24/2022 13:57   DG Chest Port 1 View  Result Date: 05/24/2022 CLINICAL DATA:  Fall at 3 a.m.  Pain.  Possible sepsis. EXAM: PORTABLE CHEST 1 VIEW COMPARISON:  None Available. FINDINGS: Right shoulder arthroplasty. Advanced left glenohumeral joint osteoarthritis. Remote left rib fractures. Midline trachea. Reverse apical lordotic positioning. Cardiomegaly accentuated by AP portable technique. Atherosclerosis in the transverse aorta. No pleural effusion or pneumothorax. Low lung volumes with resultant pulmonary interstitial prominence. No congestive failure. Skin fold over the upper right hemithorax. Mild left base volume loss. IMPRESSION: No acute findings. Cardiomegaly  without congestive failure. Aortic Atherosclerosis (ICD10-I70.0). Electronically Signed   By: Jeronimo Greaves M.D.   On: 05/24/2022 13:48   CT Head Wo Contrast  Result Date: 05/24/2022 CLINICAL DATA:  Larey Seat.  Hit head. EXAM: CT HEAD WITHOUT CONTRAST CT CERVICAL SPINE WITHOUT CONTRAST TECHNIQUE: Multidetector CT imaging of the head and cervical spine was performed following the standard protocol without intravenous contrast. Multiplanar CT image reconstructions of the cervical spine were also generated. RADIATION DOSE REDUCTION: This exam was performed according to the departmental dose-optimization program which includes automated exposure control, adjustment of the mA and/or kV according to patient size and/or use of iterative reconstruction technique. COMPARISON:  Head CT 05/05/2022 FINDINGS: CT HEAD FINDINGS Brain: Stable age related cerebral atrophy, ventriculomegaly and advanced periventricular white matter disease. No extra-axial fluid collections are identified. No CT findings for acute hemispheric infarction or intracranial hemorrhage. No mass lesions. The brainstem and cerebellum are normal. Vascular: Stable vascular calcifications without aneurysm or hyperdense vessels. Skull: No skull fracture or bone lesions. Sinuses/Orbits: The paranasal sinuses and mastoid air cells are clear. The globes are intact. Other: No scalp lesions or scalp hematoma. CT CERVICAL SPINE FINDINGS Alignment: Normal Skull base and vertebrae: No acute fracture. No primary bone lesion or focal pathologic process. Soft tissues and spinal canal: No prevertebral fluid or swelling.  No visible canal hematoma. 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 There is moderate multilevel foraminal stenosis due to uncinate spurring and facet disease. Upper chest: The lung apices are grossly clear. Other:  Bilateral carotid artery calcifications. IMPRESSION: 1. Stable age related cerebral atrophy, ventriculomegaly and advanced periventricular white matter disease. 2. No acute intracranial findings or skull fracture. 3. Normal alignment of the cervical spine without acute fracture. 4. 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 Electronically Signed   By: Rudie Meyer M.D.   On: 05/24/2022 13:39   CT Cervical Spine Wo Contrast  Result Date: 05/24/2022 CLINICAL DATA:  Larey Seat.  Hit head. EXAM: CT HEAD WITHOUT CONTRAST CT CERVICAL SPINE WITHOUT CONTRAST TECHNIQUE: Multidetector CT imaging of the head and cervical spine was performed following the standard protocol without intravenous contrast. Multiplanar CT image reconstructions of the cervical spine were also generated. RADIATION DOSE REDUCTION: This exam was performed according to the departmental dose-optimization program which includes automated exposure control, adjustment of the mA and/or kV according to patient size and/or use of iterative reconstruction technique. COMPARISON:  Head CT 05/05/2022 FINDINGS: CT HEAD FINDINGS Brain: Stable age related cerebral atrophy, ventriculomegaly and advanced periventricular white matter disease. No extra-axial fluid collections are identified. No CT findings for acute hemispheric infarction or intracranial hemorrhage. No mass lesions. The brainstem and cerebellum are normal. Vascular: Stable vascular calcifications without aneurysm or hyperdense vessels. Skull: No skull fracture or bone lesions. Sinuses/Orbits: The paranasal sinuses and mastoid air cells are clear. The globes are intact. Other: No scalp lesions or scalp hematoma. CT CERVICAL SPINE FINDINGS Alignment: Normal Skull base and vertebrae: No acute fracture. No primary bone lesion or focal pathologic process. Soft tissues and spinal canal: No prevertebral fluid or swelling. No visible canal hematoma. 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 There is moderate multilevel foraminal stenosis due to uncinate spurring and facet disease. Upper chest: The lung apices are grossly clear. Other: Bilateral carotid artery calcifications. IMPRESSION: 1. Stable age related cerebral atrophy, ventriculomegaly and advanced periventricular white matter disease. 2. No acute intracranial findings or skull fracture. 3. Normal alignment of the cervical spine without acute fracture. 4. 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 Electronically Signed   By: Rudie Meyer M.D.   On: 05/24/2022 13:39   (Echo, Carotid, EGD, Colonoscopy, ERCP)    Subjective: Patient seen and examined.  Today he looked uncomfortable before medications.  He told me he had pain all night.   Discharge Exam: Vitals:   06/08/22 2351 06/09/22 0831  BP: 90/74 97/78  Pulse: 92 91  Resp: 18 18  Temp: 97.7 F (36.5 C) 98.7 F (37.1 C)  SpO2: 96% 95%   Vitals:   06/08/22 0005 06/08/22 1504 06/08/22 2351 06/09/22 0831  BP: 95/73 102/79 90/74 97/78   Pulse: 87 100 92 91  Resp: 20 18 18 18   Temp: 98.9 F (37.2 C) (!) 97.5 F (36.4 C) 97.7 F (36.5 C) 98.7 F (37.1 C)  TempSrc:  Oral    SpO2: 100% 95% 96% 95%  Weight:      Height:        Frail and debilitated.  Alert awake.  Anxious today.  On room air. Both legs with  ulcers and dry dressing.   The results of significant diagnostics from this hospitalization (including imaging, microbiology, ancillary and laboratory) are listed below for reference.     Microbiology: No results found for this or any previous visit (from the past 240 hour(s)).   Labs: BNP (last 3 results) Recent Labs    05/24/22 1309  BNP 2,406.1*   Basic Metabolic Panel: No results for input(s): "NA", "K", "CL", "CO2", "GLUCOSE",  "BUN", "CREATININE", "CALCIUM", "MG", "PHOS" in the last 168 hours. Liver Function Tests: No results for input(s): "AST", "ALT", "ALKPHOS", "BILITOT", "PROT", "ALBUMIN" in the last 168 hours. No results for input(s): "LIPASE", "AMYLASE" in the last 168 hours. No results for input(s): "AMMONIA" in the last 168 hours. CBC: No results for input(s): "WBC", "NEUTROABS", "HGB", "HCT", "MCV", "PLT" in the last 168 hours. Cardiac Enzymes: No results for input(s): "CKTOTAL", "CKMB", "CKMBINDEX", "TROPONINI" in the last 168 hours. BNP: Invalid input(s): "POCBNP" CBG: No results for input(s): "GLUCAP" in the last 168 hours. D-Dimer No results for input(s): "DDIMER" in the last 72 hours. Hgb A1c No results for input(s): "HGBA1C" in the last 72 hours. Lipid Profile No results for input(s): "CHOL", "HDL", "LDLCALC", "TRIG", "CHOLHDL", "LDLDIRECT" in the last 72 hours. Thyroid function studies No results for input(s): "TSH", "T4TOTAL", "T3FREE", "THYROIDAB" in the last 72 hours.  Invalid input(s): "FREET3" Anemia work up No results for input(s): "VITAMINB12", "FOLATE", "FERRITIN", "TIBC", "IRON", "RETICCTPCT" in the last 72 hours. Urinalysis    Component Value Date/Time   COLORURINE YELLOW (A) 04/11/2022 2135   APPEARANCEUR CLEAR (A) 04/11/2022 2135   LABSPEC 1.025 04/11/2022 2135   PHURINE 5.0 04/11/2022 2135   GLUCOSEU >=500 (A) 04/11/2022 2135   HGBUR SMALL (A) 04/11/2022 2135   BILIRUBINUR NEGATIVE 04/11/2022 2135   KETONESUR NEGATIVE 04/11/2022 2135   PROTEINUR 30 (A) 04/11/2022 2135   NITRITE NEGATIVE 04/11/2022 2135   LEUKOCYTESUR NEGATIVE 04/11/2022 2135   Sepsis Labs No results for input(s): "WBC" in the last 168 hours.  Invalid input(s): "PROCALCITONIN", "LACTICIDVEN" Microbiology No results found for this or any previous visit (from the past 240 hour(s)).   Time coordinating discharge: 32 minutes  SIGNED:   Dorcas Carrow, MD  [facility] 06/09/2022, 9:03  AM

## 2022-06-09 NOTE — Progress Notes (Signed)
Report given to [place] at [place].

## 2022-06-09 NOTE — Progress Notes (Signed)
Review of chart, patient scheduled for discharge to [facility] today with hospice services following. Visited with Curtis Wagner at his bedside, no acute palliative needs.  No Charge.  Leeanne Deed, DNP, AGNP-C Palliative Medicine  Please call Palliative Medicine team phone with any questions [phone]. For individual providers please see AMION.

## 2022-06-09 NOTE — Progress Notes (Signed)
To whom it may concern.  As patient's attending physician and based on patient's current clinical status patient is acutely ill, nearing end-of-life, suffers from forgetfulness and distress.  He is currently not able to make any complex financial decisions.  Patient is not competent to make complex financial transactions and other complicated decisions.  Please reach out for any questions.  Dorcas Carrow MD [facility] [phone]

## 2022-06-20 ENCOUNTER — Ambulatory Visit: Payer: Medicare PPO | Admitting: Internal Medicine

## 2022-06-27 ENCOUNTER — Ambulatory Visit: Payer: Medicare PPO | Admitting: Internal Medicine

## 2022-07-24 DEATH — deceased

## 2022-08-01 ENCOUNTER — Ambulatory Visit (INDEPENDENT_AMBULATORY_CARE_PROVIDER_SITE_OTHER): Payer: Medicare PPO | Admitting: Vascular Surgery

## 2022-08-01 ENCOUNTER — Encounter (INDEPENDENT_AMBULATORY_CARE_PROVIDER_SITE_OTHER): Payer: Medicare PPO
